# Patient Record
Sex: Female | Born: 1980 | Race: Black or African American | Hispanic: No | Marital: Married | Smoking: Never smoker
Health system: Southern US, Community
[De-identification: ages and names within clinical notes are randomized; demographics above are authoritative.]

## PROBLEM LIST (undated history)

## (undated) ENCOUNTER — Inpatient Hospital Stay (HOSPITAL_COMMUNITY): Payer: Self-pay

## (undated) DIAGNOSIS — I10 Essential (primary) hypertension: Secondary | ICD-10-CM

## (undated) DIAGNOSIS — F410 Panic disorder [episodic paroxysmal anxiety] without agoraphobia: Secondary | ICD-10-CM

## (undated) DIAGNOSIS — O210 Mild hyperemesis gravidarum: Secondary | ICD-10-CM

## (undated) DIAGNOSIS — R000 Tachycardia, unspecified: Secondary | ICD-10-CM

## (undated) HISTORY — PX: BREAST EXCISIONAL BIOPSY: SUR124

## (undated) HISTORY — DX: Panic disorder (episodic paroxysmal anxiety): F41.0

## (undated) HISTORY — DX: Essential (primary) hypertension: I10

## (undated) HISTORY — PX: BREAST FIBROADENOMA SURGERY: SHX580

## (undated) HISTORY — DX: Tachycardia, unspecified: R00.0

## (undated) HISTORY — DX: Mild hyperemesis gravidarum: O21.0

## (undated) HISTORY — PX: OTHER SURGICAL HISTORY: SHX169

---

## 2005-03-08 ENCOUNTER — Ambulatory Visit: Payer: Self-pay | Admitting: Sports Medicine

## 2005-03-18 ENCOUNTER — Ambulatory Visit: Payer: Self-pay | Admitting: Family Medicine

## 2005-08-28 ENCOUNTER — Encounter (INDEPENDENT_AMBULATORY_CARE_PROVIDER_SITE_OTHER): Payer: Self-pay | Admitting: *Deleted

## 2005-08-28 LAB — CONVERTED CEMR LAB

## 2005-08-30 ENCOUNTER — Encounter (INDEPENDENT_AMBULATORY_CARE_PROVIDER_SITE_OTHER): Payer: Self-pay | Admitting: Specialist

## 2005-08-30 ENCOUNTER — Ambulatory Visit: Payer: Self-pay | Admitting: Sports Medicine

## 2005-11-01 ENCOUNTER — Ambulatory Visit: Payer: Self-pay | Admitting: Family Medicine

## 2006-03-01 ENCOUNTER — Ambulatory Visit: Payer: Self-pay | Admitting: Sports Medicine

## 2006-03-27 ENCOUNTER — Ambulatory Visit: Payer: Self-pay | Admitting: Sports Medicine

## 2006-03-27 ENCOUNTER — Encounter: Payer: Self-pay | Admitting: Sports Medicine

## 2006-04-28 ENCOUNTER — Encounter (INDEPENDENT_AMBULATORY_CARE_PROVIDER_SITE_OTHER): Payer: Self-pay | Admitting: *Deleted

## 2006-09-11 ENCOUNTER — Ambulatory Visit: Payer: Self-pay | Admitting: Family Medicine

## 2006-09-11 LAB — CONVERTED CEMR LAB: Beta hcg, urine, semiquantitative: NEGATIVE

## 2007-01-17 ENCOUNTER — Telehealth: Payer: Self-pay | Admitting: *Deleted

## 2007-01-18 ENCOUNTER — Encounter (INDEPENDENT_AMBULATORY_CARE_PROVIDER_SITE_OTHER): Payer: Self-pay | Admitting: *Deleted

## 2007-01-18 ENCOUNTER — Ambulatory Visit: Payer: Self-pay | Admitting: Family Medicine

## 2007-03-21 ENCOUNTER — Ambulatory Visit: Payer: Self-pay | Admitting: Family Medicine

## 2007-04-04 ENCOUNTER — Encounter: Payer: Self-pay | Admitting: Family Medicine

## 2007-04-04 ENCOUNTER — Ambulatory Visit: Payer: Self-pay | Admitting: Family Medicine

## 2007-04-09 ENCOUNTER — Encounter: Payer: Self-pay | Admitting: Family Medicine

## 2007-04-19 ENCOUNTER — Telehealth: Payer: Self-pay | Admitting: *Deleted

## 2007-04-20 ENCOUNTER — Encounter (INDEPENDENT_AMBULATORY_CARE_PROVIDER_SITE_OTHER): Payer: Self-pay | Admitting: Family Medicine

## 2007-04-20 ENCOUNTER — Ambulatory Visit: Payer: Self-pay | Admitting: Family Medicine

## 2007-04-20 LAB — CONVERTED CEMR LAB
Basophils Relative: 0 % (ref 0–1)
Eosinophils Absolute: 0.1 10*3/uL (ref 0.0–0.7)
Hemoglobin: 13.9 g/dL (ref 12.0–15.0)
MCHC: 33.9 g/dL (ref 30.0–36.0)
MCV: 95.3 fL (ref 78.0–100.0)
Monocytes Absolute: 0.7 10*3/uL (ref 0.1–1.0)
Monocytes Relative: 14 % — ABNORMAL HIGH (ref 3–12)
RBC: 4.3 M/uL (ref 3.87–5.11)

## 2007-04-25 ENCOUNTER — Ambulatory Visit: Payer: Self-pay | Admitting: Family Medicine

## 2007-04-25 LAB — CONVERTED CEMR LAB
Protein, U semiquant: NEGATIVE
Urobilinogen, UA: 0.2
WBC Urine, dipstick: NEGATIVE

## 2007-05-02 ENCOUNTER — Telehealth: Payer: Self-pay | Admitting: *Deleted

## 2007-05-03 ENCOUNTER — Encounter (INDEPENDENT_AMBULATORY_CARE_PROVIDER_SITE_OTHER): Payer: Self-pay | Admitting: Family Medicine

## 2007-05-03 ENCOUNTER — Ambulatory Visit: Payer: Self-pay | Admitting: Family Medicine

## 2007-05-03 LAB — CONVERTED CEMR LAB

## 2007-05-07 ENCOUNTER — Encounter (INDEPENDENT_AMBULATORY_CARE_PROVIDER_SITE_OTHER): Payer: Self-pay | Admitting: Family Medicine

## 2007-05-10 ENCOUNTER — Telehealth: Payer: Self-pay | Admitting: *Deleted

## 2007-05-16 ENCOUNTER — Ambulatory Visit: Payer: Self-pay | Admitting: Family Medicine

## 2007-05-16 LAB — CONVERTED CEMR LAB
CO2: 24 meq/L (ref 19–32)
Chloride: 107 meq/L (ref 96–112)
Creatinine, Ser: 0.77 mg/dL (ref 0.40–1.20)
HDL: 51 mg/dL (ref >39)
LDL Cholesterol: 80 mg/dL (ref 0–99)
VLDL: 18 mg/dL (ref 0–40)

## 2007-05-21 ENCOUNTER — Encounter: Payer: Self-pay | Admitting: Family Medicine

## 2007-06-13 ENCOUNTER — Ambulatory Visit: Payer: Self-pay | Admitting: Family Medicine

## 2007-06-13 ENCOUNTER — Ambulatory Visit (HOSPITAL_COMMUNITY): Admission: RE | Admit: 2007-06-13 | Discharge: 2007-06-13 | Payer: Self-pay | Admitting: Family Medicine

## 2007-06-18 ENCOUNTER — Encounter: Payer: Self-pay | Admitting: Family Medicine

## 2008-01-28 ENCOUNTER — Telehealth (INDEPENDENT_AMBULATORY_CARE_PROVIDER_SITE_OTHER): Payer: Self-pay | Admitting: *Deleted

## 2008-01-29 ENCOUNTER — Ambulatory Visit: Payer: Self-pay | Admitting: Family Medicine

## 2008-01-29 LAB — CONVERTED CEMR LAB

## 2008-02-20 ENCOUNTER — Ambulatory Visit: Payer: Self-pay | Admitting: Family Medicine

## 2008-02-20 LAB — CONVERTED CEMR LAB

## 2008-03-18 ENCOUNTER — Telehealth: Payer: Self-pay | Admitting: Family Medicine

## 2008-03-19 ENCOUNTER — Encounter (INDEPENDENT_AMBULATORY_CARE_PROVIDER_SITE_OTHER): Payer: Self-pay | Admitting: Family Medicine

## 2008-03-19 ENCOUNTER — Ambulatory Visit: Payer: Self-pay | Admitting: Family Medicine

## 2008-03-19 ENCOUNTER — Encounter: Payer: Self-pay | Admitting: Family Medicine

## 2008-03-19 LAB — CONVERTED CEMR LAB
Chlamydia, DNA Probe: NEGATIVE
GC Probe Amp, Genital: NEGATIVE

## 2008-03-20 ENCOUNTER — Encounter (INDEPENDENT_AMBULATORY_CARE_PROVIDER_SITE_OTHER): Payer: Self-pay | Admitting: Family Medicine

## 2008-03-24 ENCOUNTER — Ambulatory Visit: Payer: Self-pay | Admitting: Family Medicine

## 2008-03-24 ENCOUNTER — Telehealth: Payer: Self-pay | Admitting: *Deleted

## 2008-03-24 LAB — CONVERTED CEMR LAB: Beta hcg, urine, semiquantitative: POSITIVE

## 2008-03-31 ENCOUNTER — Telehealth (INDEPENDENT_AMBULATORY_CARE_PROVIDER_SITE_OTHER): Payer: Self-pay | Admitting: Family Medicine

## 2008-04-02 ENCOUNTER — Encounter (INDEPENDENT_AMBULATORY_CARE_PROVIDER_SITE_OTHER): Payer: Self-pay | Admitting: Family Medicine

## 2008-04-02 ENCOUNTER — Ambulatory Visit: Payer: Self-pay | Admitting: Family Medicine

## 2008-04-02 ENCOUNTER — Telehealth (INDEPENDENT_AMBULATORY_CARE_PROVIDER_SITE_OTHER): Payer: Self-pay | Admitting: Family Medicine

## 2008-04-02 ENCOUNTER — Encounter: Payer: Self-pay | Admitting: Family Medicine

## 2008-04-02 LAB — CONVERTED CEMR LAB: Urine culture (CF units/mL): NEGATIVE CFunits/mL

## 2008-04-03 ENCOUNTER — Encounter (INDEPENDENT_AMBULATORY_CARE_PROVIDER_SITE_OTHER): Payer: Self-pay | Admitting: Family Medicine

## 2008-04-03 LAB — CONVERTED CEMR LAB
AST: 16 units/L (ref 0–37)
Alkaline Phosphatase: 78 units/L (ref 39–117)
Antibody Screen: NEGATIVE
BUN: 17 mg/dL (ref 6–23)
Creatinine, Ser: 0.89 mg/dL (ref 0.40–1.20)
Glucose, Bld: 89 mg/dL (ref 70–99)
Hepatitis B Surface Ag: NEGATIVE
Lymphocytes Relative: 34 % (ref 12–46)
Lymphs Abs: 2.4 10*3/uL (ref 0.7–4.0)
MCV: 95.6 fL (ref 78.0–100.0)
Monocytes Relative: 12 % (ref 3–12)
Neutro Abs: 3.8 10*3/uL (ref 1.7–7.7)
Neutrophils Relative %: 53 % (ref 43–77)
RBC: 4.81 M/uL (ref 3.87–5.11)
Rh Type: POSITIVE
Rubella: 80.1 intl units/mL — ABNORMAL HIGH
WBC: 7.1 10*3/uL (ref 4.0–10.5)

## 2008-04-11 ENCOUNTER — Telehealth (INDEPENDENT_AMBULATORY_CARE_PROVIDER_SITE_OTHER): Payer: Self-pay | Admitting: *Deleted

## 2008-04-15 ENCOUNTER — Telehealth: Payer: Self-pay | Admitting: *Deleted

## 2008-04-16 ENCOUNTER — Encounter (INDEPENDENT_AMBULATORY_CARE_PROVIDER_SITE_OTHER): Payer: Self-pay | Admitting: Family Medicine

## 2008-04-16 ENCOUNTER — Ambulatory Visit: Payer: Self-pay | Admitting: Family Medicine

## 2008-04-16 ENCOUNTER — Encounter: Payer: Self-pay | Admitting: Family Medicine

## 2008-04-16 ENCOUNTER — Other Ambulatory Visit: Admission: RE | Admit: 2008-04-16 | Discharge: 2008-04-16 | Payer: Self-pay | Admitting: Family Medicine

## 2008-04-16 LAB — CONVERTED CEMR LAB: Whiff Test: NEGATIVE

## 2008-04-21 ENCOUNTER — Encounter: Payer: Self-pay | Admitting: Family Medicine

## 2008-04-21 ENCOUNTER — Encounter: Payer: Self-pay | Admitting: *Deleted

## 2008-04-28 ENCOUNTER — Telehealth: Payer: Self-pay | Admitting: Family Medicine

## 2008-04-28 ENCOUNTER — Ambulatory Visit: Payer: Self-pay | Admitting: Family Medicine

## 2008-04-30 ENCOUNTER — Ambulatory Visit: Payer: Self-pay | Admitting: Family Medicine

## 2008-05-01 ENCOUNTER — Ambulatory Visit: Payer: Self-pay | Admitting: Family Medicine

## 2008-05-05 ENCOUNTER — Ambulatory Visit: Payer: Self-pay | Admitting: Obstetrics & Gynecology

## 2008-05-05 LAB — CONVERTED CEMR LAB
ALT: 17 units/L (ref 0–35)
BUN: 6 mg/dL (ref 6–23)
CO2: 21 meq/L (ref 19–32)
Calcium: 8.7 mg/dL (ref 8.4–10.5)
Chloride: 105 meq/L (ref 96–112)
Collection Interval-CRCL: 24 hr
Creatinine 24 HR UR: 1704 mg/24hr (ref 700–1800)
Creatinine Clearance: 223 mL/min — ABNORMAL HIGH (ref 75–115)
Creatinine, Ser: 0.53 mg/dL (ref 0.40–1.20)
Creatinine, Urine: 213 mg/dL
HCT: 36.1 % (ref 36.0–46.0)
Hemoglobin: 12.3 g/dL (ref 12.0–15.0)
MCV: 92.3 fL (ref 78.0–100.0)
Platelets: 262 10*3/uL (ref 150–400)
RDW: 12.7 % (ref 11.5–15.5)
Total Bilirubin: 0.2 mg/dL — ABNORMAL LOW (ref 0.3–1.2)
Uric Acid, Serum: 3.3 mg/dL (ref 2.4–7.0)
WBC: 5 10*3/uL (ref 4.0–10.5)

## 2008-05-08 ENCOUNTER — Telehealth: Payer: Self-pay | Admitting: *Deleted

## 2008-05-20 ENCOUNTER — Ambulatory Visit: Payer: Self-pay | Admitting: Family Medicine

## 2008-05-22 ENCOUNTER — Ambulatory Visit: Payer: Self-pay | Admitting: Obstetrics & Gynecology

## 2008-06-05 ENCOUNTER — Ambulatory Visit: Payer: Self-pay | Admitting: Family Medicine

## 2008-06-09 ENCOUNTER — Telehealth: Payer: Self-pay | Admitting: Family Medicine

## 2008-06-25 ENCOUNTER — Ambulatory Visit: Payer: Self-pay | Admitting: Family Medicine

## 2008-06-27 ENCOUNTER — Ambulatory Visit (HOSPITAL_COMMUNITY): Admission: RE | Admit: 2008-06-27 | Discharge: 2008-06-27 | Payer: Self-pay | Admitting: Obstetrics & Gynecology

## 2008-07-03 ENCOUNTER — Ambulatory Visit: Payer: Self-pay | Admitting: Family Medicine

## 2008-07-04 ENCOUNTER — Encounter: Payer: Self-pay | Admitting: Family Medicine

## 2008-07-04 LAB — CONVERTED CEMR LAB: Trich, Wet Prep: NONE SEEN

## 2008-07-24 ENCOUNTER — Telehealth: Payer: Self-pay | Admitting: Family Medicine

## 2008-07-24 ENCOUNTER — Ambulatory Visit: Payer: Self-pay | Admitting: Family Medicine

## 2008-08-04 ENCOUNTER — Telehealth: Payer: Self-pay | Admitting: Family Medicine

## 2008-08-14 ENCOUNTER — Ambulatory Visit: Payer: Self-pay | Admitting: Obstetrics & Gynecology

## 2008-09-08 ENCOUNTER — Ambulatory Visit: Payer: Self-pay | Admitting: Obstetrics & Gynecology

## 2008-09-08 LAB — CONVERTED CEMR LAB
HCT: 37.4 % (ref 36.0–46.0)
Hemoglobin: 12.8 g/dL (ref 12.0–15.0)
MCHC: 34.2 g/dL (ref 30.0–36.0)
MCV: 97.9 fL (ref 78.0–100.0)
RBC: 3.82 M/uL — ABNORMAL LOW (ref 3.87–5.11)
WBC: 8 10*3/uL (ref 4.0–10.5)

## 2008-09-12 ENCOUNTER — Ambulatory Visit (HOSPITAL_COMMUNITY): Admission: RE | Admit: 2008-09-12 | Discharge: 2008-09-12 | Payer: Self-pay | Admitting: Family Medicine

## 2008-09-22 ENCOUNTER — Ambulatory Visit: Payer: Self-pay | Admitting: Obstetrics & Gynecology

## 2008-10-06 ENCOUNTER — Ambulatory Visit: Payer: Self-pay | Admitting: Obstetrics & Gynecology

## 2008-10-16 ENCOUNTER — Ambulatory Visit: Payer: Self-pay | Admitting: Family Medicine

## 2008-10-16 ENCOUNTER — Telehealth: Payer: Self-pay | Admitting: *Deleted

## 2008-10-22 ENCOUNTER — Ambulatory Visit (HOSPITAL_COMMUNITY): Admission: RE | Admit: 2008-10-22 | Discharge: 2008-10-22 | Payer: Self-pay | Admitting: Obstetrics and Gynecology

## 2008-10-23 ENCOUNTER — Ambulatory Visit: Payer: Self-pay | Admitting: Obstetrics & Gynecology

## 2008-10-30 ENCOUNTER — Ambulatory Visit: Payer: Self-pay | Admitting: Family Medicine

## 2008-10-31 ENCOUNTER — Encounter: Payer: Self-pay | Admitting: Family Medicine

## 2008-10-31 LAB — CONVERTED CEMR LAB: GC Probe Amp, Genital: NEGATIVE

## 2008-11-06 ENCOUNTER — Ambulatory Visit: Payer: Self-pay | Admitting: Family Medicine

## 2008-11-13 ENCOUNTER — Ambulatory Visit: Payer: Self-pay | Admitting: Obstetrics & Gynecology

## 2008-11-17 ENCOUNTER — Ambulatory Visit (HOSPITAL_COMMUNITY): Admission: RE | Admit: 2008-11-17 | Discharge: 2008-11-17 | Payer: Self-pay | Admitting: Family Medicine

## 2008-11-17 ENCOUNTER — Ambulatory Visit: Payer: Self-pay | Admitting: Obstetrics & Gynecology

## 2008-11-20 ENCOUNTER — Inpatient Hospital Stay (HOSPITAL_COMMUNITY): Admission: AD | Admit: 2008-11-20 | Discharge: 2008-11-23 | Payer: Self-pay | Admitting: Obstetrics & Gynecology

## 2008-11-20 ENCOUNTER — Ambulatory Visit: Payer: Self-pay | Admitting: Obstetrics & Gynecology

## 2008-11-25 ENCOUNTER — Encounter: Payer: Self-pay | Admitting: Family Medicine

## 2008-11-26 ENCOUNTER — Ambulatory Visit: Payer: Self-pay | Admitting: Family Medicine

## 2008-11-27 ENCOUNTER — Telehealth: Payer: Self-pay | Admitting: Family Medicine

## 2009-01-02 ENCOUNTER — Ambulatory Visit: Payer: Self-pay | Admitting: Family Medicine

## 2009-02-10 ENCOUNTER — Telehealth: Payer: Self-pay | Admitting: *Deleted

## 2009-06-01 ENCOUNTER — Ambulatory Visit: Payer: Self-pay | Admitting: Family Medicine

## 2009-06-01 ENCOUNTER — Encounter: Payer: Self-pay | Admitting: Family Medicine

## 2009-06-01 ENCOUNTER — Other Ambulatory Visit: Admission: RE | Admit: 2009-06-01 | Discharge: 2009-06-01 | Payer: Self-pay | Admitting: Family Medicine

## 2009-06-01 DIAGNOSIS — J3089 Other allergic rhinitis: Secondary | ICD-10-CM | POA: Insufficient documentation

## 2009-06-01 DIAGNOSIS — R5381 Other malaise: Secondary | ICD-10-CM | POA: Insufficient documentation

## 2009-06-01 DIAGNOSIS — J309 Allergic rhinitis, unspecified: Secondary | ICD-10-CM

## 2009-06-01 DIAGNOSIS — R5383 Other fatigue: Secondary | ICD-10-CM

## 2009-06-01 HISTORY — DX: Other allergic rhinitis: J30.89

## 2009-06-01 LAB — CONVERTED CEMR LAB
HCT: 40.9 % (ref 36.0–46.0)
Hemoglobin: 13.5 g/dL (ref 12.0–15.0)
MCV: 96.9 fL (ref 78.0–100.0)
Platelets: 281 10*3/uL (ref 150–400)
RBC: 4.22 M/uL (ref 3.87–5.11)
WBC: 5 10*3/uL (ref 4.0–10.5)

## 2009-06-05 ENCOUNTER — Encounter: Payer: Self-pay | Admitting: Family Medicine

## 2009-06-05 LAB — CONVERTED CEMR LAB: Pap Smear: NORMAL

## 2009-12-03 ENCOUNTER — Ambulatory Visit: Payer: Self-pay | Admitting: Family Medicine

## 2010-02-04 ENCOUNTER — Ambulatory Visit: Payer: Self-pay | Admitting: Family Medicine

## 2010-02-05 ENCOUNTER — Encounter: Payer: Self-pay | Admitting: Family Medicine

## 2010-02-05 DIAGNOSIS — R05 Cough: Secondary | ICD-10-CM | POA: Insufficient documentation

## 2010-02-05 DIAGNOSIS — R059 Cough, unspecified: Secondary | ICD-10-CM | POA: Insufficient documentation

## 2010-02-05 LAB — CONVERTED CEMR LAB
GC Probe Amp, Genital: NEGATIVE
Whiff Test: NEGATIVE

## 2010-02-09 ENCOUNTER — Telehealth: Payer: Self-pay | Admitting: Family Medicine

## 2010-03-21 ENCOUNTER — Encounter: Payer: Self-pay | Admitting: Family Medicine

## 2010-03-30 NOTE — Assessment & Plan Note (Signed)
Summary: CHECK FOR STD/[facility]   Vital Signs:  Patient profile:   30 year old female Height:      65.25 inches Weight:      140 pounds BMI:     23.20 Pulse rate:   69 / minute BP sitting:   128 / 72  (right arm)  Vitals Entered By: Rochele Pages RN (February 05, 2010 2:05 PM) CC: STD screen, chest congestion Pain Assessment Patient in pain? no        Primary Care Provider:  Bobby Rumpf  MD  CC:  STD screen and chest congestion.  History of Present Illness: 1. STD Screen: Pt is a 30 yo F who requests a STD screen. She currently has a new sexual partner and does not know their STD status. She uses condoms every time. Denies any symptoms.   2. Cough: Started having a dry cough 5 days ago. Says there there has been something going around her office. Denies fevers/chills, rhinorrhea, sputum production. Complains of a sore throat. Tried Robitussin but says it hasn't helped. Has never had this before.   See prior meds for med rec  Habits & Providers  Alcohol-Tobacco-Diet     Tobacco Status: never  Medications Prior to Update: 1)  Fluticasone Propionate 50 Mcg/act Susp (Fluticasone Propionate) .... 2 Sprays Each Nostril Daily For Allergies  Allergies (verified): 1)  ! * Paraphenylenediamine  Physical Exam  General:  Well-developed,well-nourished,in no acute distress; alert,appropriate and cooperative throughout examination Ears:  External ear exam shows no significant lesions or deformities.  Otoscopic examination reveals clear canals, tympanic membranes are intact bilaterally without bulging, retraction, inflammation or discharge. Hearing is grossly normal bilaterally. Mouth:  Oral mucosa and oropharynx without lesions or exudates.  Teeth in good repair. Some minor postnasal drip  Neck:  No deformities, masses, or tenderness noted. Lungs:  Normal respiratory effort, chest expands symmetrically. Lungs are clear to auscultation, no crackles or wheezes. Heart:  Normal rate and  regular rhythm. S1 and S2 normal without gallop, murmur, click, rub or other extra sounds. Genitalia:  Normal introitus for age, no external lesions, +ve thick white vaginal discharge, mucosa pink and moist, no vaginal or cervical lesions, no vaginal atrophy, no friaility or hemorrhage, normal uterus size and position, no adnexal masses or tenderness   Impression & Recommendations:  Problem # 1:  SCREENING EXAMINATION FOR VENEREAL DISEASE (ICD-V74.5) Assessment Comment Only Testing as below. Follow up as needed. Will notify of results.   Orders: GC/Chlamydia-[facility] ([id number]/87491) Wet Prep- [facility] [phone]) [facility]- Est Level  3 (99213)Future Orders: Hep Bs Ag-[facility] ([id number]) ... 02/03/2011 RPR-[facility] [phone]) ... 02/16/2011 HIV-[facility] ([id number]) ... 02/10/2011  Problem # 2:  COUGH (ICD-786.2) Assessment: New  Likely viral URI given symptom presentation. Will give Tessalon Perles for relief. Advised regarding red flags that would prompt return to care.   Orders: [facility]- Est Level  3 (96295)  Complete Medication List: 1)  Fluticasone Propionate 50 Mcg/act Susp (Fluticasone propionate) .... 2 sprays each nostril daily for allergies 2)  Tessalon Perles 100 Mg Caps (Benzonatate) .... One tab by mouth three times a day as needed for cough  Patient Instructions: 1)  It was great to see you today!  2)  Take Tessalon Perles as needed for cough.  3)  Come back in if symptoms are getting worse.  Prescriptions: TESSALON PERLES 100 MG CAPS (BENZONATATE) one tab by mouth three times a day as needed for cough  #30 x 0   Entered and Authorized by:  Bobby Rumpf  MD   Signed by:   Bobby Rumpf  MD on 02/05/2010   Method used:   [facility]        [facility]  [address] [id number]       [address]       [place], [place]  [address]       Ph: [phone] or [phone]       Fax: [phone]   RxID:   [id number]    Orders Added: 1)  GC/Chlamydia-[facility] [87591/87491] 2)  Mellody Drown Prep-  [facility] [87210] 3)  Hep Bs Ag-[facility] [[id number]] 4)  RPR-[facility] [[id number]] 5)  HIV-[facility] [[id number]] 6)  [facility]- Est Level  3 [16606]    Laboratory Results  Date/Time Received: February 05, 2010 2:55 PM  Date/Time Reported: February 05, 2010 3:33 PM   [employer] Source: vag WBC/hpf: 10-20 Bacteria/hpf: 3+  Rods Clue cells/hpf: none  Negative whiff Yeast/hpf: none Trichomonas/hpf: none Comments: ...............test performed by......Marland KitchenBonnie A. [place], MLS (ASCP)cm

## 2010-03-30 NOTE — Assessment & Plan Note (Signed)
Summary: cpe,df   Vital Signs:  Patient profile:   30 year old female Height:      65.25 inches Weight:      136 pounds BMI:     22.54 BSA:     1.69 Temp:     99.4 degrees F Pulse rate:   109 / minute BP sitting:   144 / 92  Vitals Entered By: Jone Baseman CMA (June 01, 2009 4:19 PM)  Serial Vital Signs/Assessments:  Time      Position  BP       Pulse  Resp  Temp     By                     132/80                         Ancil Boozer  MD  CC: CPP Is Patient Diabetic? No Pain Assessment Patient in pain? no        Primary Care Provider:  Ancil Boozer  MD  CC:  CPP.  History of Present Illness: concerns today: cold vs allergy symptoms: has been going on for about 1 month. initially with minor non-productive cough that responded to OTC antitussives.  no with also rhinorrhea - initially clear, now more yellow.  trying otc allergy med without much relief.  no fevers noted.  no GI symptoms. no one else around her sick.  fatigue: has 6 mo old at home - only giving her 4 hr stretch of sleep at a time.  denies feeling depressed or down.  is in a new stressful position at work though. denies heavy periods.  denies cold or heat intolerance.    htn: did check bp for a while post partum - numbers at that time were typically 129/82ish with occasional higher ones.  not feeling bad but hasn't been watching numbers recently.    preventative care - uses condoms for contraception. due for pap today.   Habits & Providers  Alcohol-Tobacco-Diet     Tobacco Status: never  Current Medications (verified): 1)  Fluticasone Propionate 50 Mcg/act Susp (Fluticasone Propionate) .... 2 Sprays Each Nostril Daily For Allergies  Allergies (verified): 1)  ! * Paraphenylenediamine  Past History:  Past medical, surgical, family and social histories (including risk factors) reviewed, and no changes noted (except as noted below).  Past Medical History: Reviewed history from 01/02/2009 and no  changes required. ? Panic attacks--remote,  abn paps (ascus)--colpo nl allergic reaction resulting in lymphadenitis to  hair dye 04/2007 G1P1001 HTN  Past Surgical History: Reviewed history from 04/16/2008 and no changes required. fibroadenoma removal r. breast - 08/29/2003,  gardisil x 3 - 08/30/2005    Family History: Reviewed history from 04/16/2008 and no changes required. cad--mom late 30`s  HTN in sisters, mom no hx of lupus in family.    Social History: Reviewed history from 04/16/2008 and no changes required. Lives with child.  Parent/sister in town.  Denies drugs or alcohol, or smoking.  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    Review of Systems       per HPI.  No vision changes, no hearing changes, no , shortness of breath, chest pain, abdominal pain, change in bowel habits, diarrhea, constipation, melena, BRBPR, dysuria, urinary frequency, joint aches or pains, rash.   Physical Exam  General:  pleasant, well hydrated, well nourished female   Head:  NCAT Eyes:  allergic conjunctivitis  Ears:  R TM occluded by cerumen.  L TM WNL Nose:  clear rhiorrhea with boggy turbinates R>L Mouth:  moist mucus membranes. mild erythema posterior pharynx Neck:  supple.  no LAD Lungs:  Normal respiratory effort, chest expands symmetrically. Lungs are clear to auscultation, no crackles or wheezes. Heart:  Normal rate and regular rhythm. S1 and S2 normal without gallop, murmur, click, rub or other extra sounds. Abdomen:  Bowel sounds positive,abdomen soft and non-tender without masses, organomegaly or hernias noted. Genitalia:  Normal introitus for age, no external lesions, no vaginal discharge, mucosa pink and moist, no vaginal or cervical lesions, no vaginal atrophy, no friaility or hemorrhage, normal uterus size and position, no adnexal masses or tenderness Extremities:  no edema.  2+ pulses Neurologic:  alert & oriented X3, cranial nerves II-XII  intact, and gait normal.   Skin:  Intact without suspicious lesions or rashes Psych:  Cognition and judgment appear intact. Alert and cooperative with normal attention span and concentration. No apparent delusions, illusions, hallucinations   Impression & Recommendations:  Problem # 1:  ROUTINE GYNECOLOGICAL EXAMINATION (ICD-V72.31) Assessment Unchanged  doing well.  pap performed.  condoms for contraception  Orders: [facility] - Est  18-39 yrs [phone])  Problem # 2:  ALLERGIC RHINITIS (ICD-477.9) Assessment: New  try otc allergy med + flonase.    Her updated medication list for this problem includes:    Fluticasone Propionate 50 Mcg/act Susp (Fluticasone propionate) .Marland Kitchen... 2 sprays each nostril daily for allergies  Orders: [facility]- Est Level  3 (60454)  Problem # 3:  HYPERTENSION (ICD-401.1) Assessment: Unchanged  monitor closely to see if need to restart med  Orders: [facility]- Est Level  3 (09811)  Problem # 4:  FATIGUE (ICD-780.79) Assessment: New check basic labs but likely is 2/2 not enough sleep with new baby.  monitor  Orders: CBC-[facility] ([id number]) TSH-[facility] ([id number]) [facility]- Est Level  3 (57846)  Complete Medication List: 1)  Fluticasone Propionate 50 Mcg/act Susp (Fluticasone propionate) .... 2 sprays each nostril daily for allergies  Other Orders: Pap Smear-[facility] ([id number])  Patient Instructions: 1)  If you start getting fevers or were getting better and then started getting signfiicantly worse - call for an appointment.  you may need an antibiotic at that point. 2)  For allergies use the nose spray and try an over the counter allergy med - such as generic claritin, generic zyrtec or generic allegra. 3)  Keep an eye on your blood pressures.  If they stay consistently elevated above 140/90 then we may need to get you back on your blood pressure medicine. 4)  I suspect your fatigue is primarily from being a new mom but if anything unusual comes from the labs I will let you know.   5)  I'll send you a letter with your pap test results.  Prescriptions: FLUTICASONE PROPIONATE 50 MCG/ACT SUSP (FLUTICASONE PROPIONATE) 2 sprays each nostril daily for allergies  #1 x 6   Entered and Authorized by:   Ancil Boozer  MD   Signed by:   Ancil Boozer  MD on 06/01/2009   Method used:   [facility]        [facility]  [address]       [address]       [place], [place]  [address]       Ph: [phone] or [phone]       Fax: [phone]   RxID:   [id number]   Prevention & Chronic Care Immunizations  Influenza vaccine: Fluvax Non-MCR  (01/02/2009)    Tetanus booster: Not documented    Pneumococcal vaccine: Not documented  Other Screening   Pap smear: NEGATIVE FOR INTRAEPITHELIAL LESIONS OR MALIGNANCY.  (04/16/2008)   Pap smear due: 08/29/2006   Smoking status: never  (06/01/2009)  Hypertension   Last Blood Pressure: 144 / 92  (06/01/2009)   Serum creatinine: 0.53  (05/05/2008)   Serum potassium 4.2  (05/05/2008)    Hypertension flowsheet reviewed?: Yes   Progress toward BP goal: At goal  Self-Management Support :   Personal Goals (by the next clinic visit) :      Personal blood pressure goal: 140/90  (06/01/2009)   Hypertension self-management support: Not documented    Hypertension self-management support not done because: Good outcomes  (06/01/2009)

## 2010-03-30 NOTE — Assessment & Plan Note (Signed)
Summary: FLU SHOT/KH  Nurse Visit   Allergies: 1)  ! * Paraphenylenediamine  Immunizations Administered:  Influenza Vaccine # 1:    Vaccine Type: Fluvax 3+    Site: right deltoid    Mfr: GlaxoSmithKline    Dose: 0.5 ml    Route: IM    Given by: Jone Baseman CMA    Exp. Date: 08/25/2010    Lot #: [id number]    VIS given: 09/22/09 version given December 03, 2009.  Flu Vaccine Consent Questions:    Do you have a history of severe allergic reactions to this vaccine? no    Any prior history of allergic reactions to egg and/or gelatin? no    Do you have a sensitivity to the preservative Thimersol? no    Do you have a past history of Guillan-Barre Syndrome? no    Do you currently have an acute febrile illness? no    Have you ever had a severe reaction to latex? no    Vaccine information given and explained to patient? yes    Are you currently pregnant? no  Orders Added: 1)  Flu Vaccine 62yrs + [90658] 2)  Admin 1st Vaccine [56213]

## 2010-03-30 NOTE — Letter (Signed)
Summary: Generic Letter  [facility]  [address]   [place], [place] [address]   Phone: [phone]  Fax: [phone]    06/05/2009  Paige Neal [address] [place], [place]  [address]  Dear Ms. [name],  Your recent pap smear was normal - your next one is due in 1 year.  Also your blood work for your fatigue was all normal at this time.  If you have questions please feel free to call the [facility].      Sincerely,   Ancil Boozer  MD  Appended Document: Generic Letter mailed.

## 2010-04-01 NOTE — Progress Notes (Signed)
Summary: phn msg  Phone Note Call from Patient Call back at [phone]   Caller: Patient Summary of Call: pt is asking for results of test from last week Initial call taken by: De Nurse,  February 09, 2010 2:15 PM  Follow-up for Phone Call        Discussed results by phone.  Follow-up by: Bobby Rumpf  MD,  February 09, 2010 2:25 PM

## 2010-04-30 ENCOUNTER — Encounter: Payer: Self-pay | Admitting: Family Medicine

## 2010-04-30 ENCOUNTER — Ambulatory Visit (INDEPENDENT_AMBULATORY_CARE_PROVIDER_SITE_OTHER): Payer: BC Managed Care – PPO | Admitting: Family Medicine

## 2010-04-30 VITALS — BP 132/94 | HR 74 | Temp 99.1°F | Ht 66.0 in | Wt 132.0 lb

## 2010-04-30 DIAGNOSIS — H60399 Other infective otitis externa, unspecified ear: Secondary | ICD-10-CM

## 2010-04-30 DIAGNOSIS — H6090 Unspecified otitis externa, unspecified ear: Secondary | ICD-10-CM

## 2010-04-30 MED ORDER — HYDROCORTISONE-ACETIC ACID 1-2 % OT SOLN: 4.0000 [drp] | Freq: Four times a day (QID) | OTIC | Status: AC

## 2010-04-30 NOTE — Patient Instructions (Signed)
Use ear drops for 5-7 days or  Until improved.  May use hydrogen perozide or debrox drops to help with earwax- do not put  qtips in ear. Follow-up as needed

## 2010-04-30 NOTE — Progress Notes (Signed)
  Subjective:    Patient ID: Paige Neal, female    DOB: 1980/05/06, 30 y.o.   MRN: [id number]  HPI1-2 weeks of bilateral ear pain.  Cleaning ears with qtips.  No ear drainage.  Some green nasal discharge.     Review of SystemsGeneral:  Negative for fever, chills, malaise, myalgias HEENT: Negative for conjunctivitis, , sore throat Respiratory:  Negative for cough, sputum, dyspnea Abdomen: Negative for abdominal pain, emesis, diarrhea Skin:  Negative for rash        Objective:   Physical Exam  Constitutional: She appears well-developed and well-nourished.  HENT:  Head: Normocephalic.       Right ear canal extremely tender. Large cerumen ball dislodged with water irrigation withs ome surrounding canal irritation.  Bilateral TM's wnl.    Eyes: Conjunctivae are normal. Pupils are equal, round, and reactive to light. Left eye exhibits no discharge.  Neck: Neck supple. No thyromegaly present.  Cardiovascular: Normal rate and regular rhythm.   No murmur heard. Pulmonary/Chest: Effort normal and breath sounds normal. She has no wheezes.  Lymphadenopathy:    She has no cervical adenopathy.          Assessment & Plan:

## 2010-04-30 NOTE — Assessment & Plan Note (Signed)
Water irrigation performed.  Right Otitis media.  Vosol-HC drops.  Advised on ear hygiene, avoiding qtips.  Follow-up prn.

## 2010-05-20 ENCOUNTER — Ambulatory Visit (INDEPENDENT_AMBULATORY_CARE_PROVIDER_SITE_OTHER): Payer: BC Managed Care – PPO | Admitting: Family Medicine

## 2010-05-20 ENCOUNTER — Encounter: Payer: Self-pay | Admitting: Family Medicine

## 2010-05-20 VITALS — BP 132/94 | HR 75 | Temp 98.2°F | Ht 65.0 in | Wt 132.6 lb

## 2010-05-20 DIAGNOSIS — I10 Essential (primary) hypertension: Secondary | ICD-10-CM

## 2010-05-20 DIAGNOSIS — Z113 Encounter for screening for infections with a predominantly sexual mode of transmission: Secondary | ICD-10-CM

## 2010-05-20 LAB — RPR

## 2010-05-20 LAB — BASIC METABOLIC PANEL
CO2: 27 mEq/L (ref 19–32)
Calcium: 9.7 mg/dL (ref 8.4–10.5)
Creat: 0.88 mg/dL (ref 0.40–1.20)
Sodium: 138 mEq/L (ref 135–145)

## 2010-05-20 NOTE — Patient Instructions (Signed)
It was great to see you today! You should think about if you are putting too much on yourself. I want you to come back to see me in three months (in June 2012) Try to get exercise as we discussed - 30-45 minutes.  I will let you know about your tests by phone or letter    2 Gram Low Sodium Diet A 2 gram sodium diet restricts the amount of sodium in the diet to no more than 2 grams (g) or 2000 milligrams (mg) daily. Limiting the amount of sodium is often used to help lower blood pressure. It is important if you have heart, liver, or kidney problems. Many foods contain sodium for flavor and sometimes as a preservative. When the amount of sodium in a diet needs to be low, it is important to know what to look for when choosing foods and drinks. The following includes some information and guidelines to help make it easier for you to adapt to a low sodium diet. QUICK TIPS  Do not add salt to food.   Avoid convenience items and fast food.   Choose unsalted snack foods.   Buy lower sodium products, often labeled as "lower sodium" or "no salt added."   Check food labels to learn how much sodium is in 1 serving.   When eating at a restaurant, ask that your food be prepared with less salt or none, if possible.  READING FOOD LABELS FOR SODIUM INFORMATION The nutrition facts label is a good place to find how much sodium is in foods. Look for products with no more than 500 to 600 mg of sodium per meal and no more than 150 mg per serving. Remember that 2 g = 2000 mg. The food label may also list foods as:  Sodium-free: Less than 5 mg in a serving.   Very low sodium: 35 mg or less in a serving.   Low-sodium: 140 mg or less in a serving.   Light in sodium: 50% less sodium in a serving. For example, if a food that usually has 300 mg of sodium is changed to become light in sodium, it will have 150 mg of sodium.   Reduced sodium: 25% less sodium in a serving. For example, if a food that usually has  400 mg of sodium is changed to reduced sodium, it will have 300 mg of sodium.  CHOOSING FOODS AVOID CHOOSE  Grains: Salted crackers and snack items. Some cereals, including instant hot cereals. Bread stuffing and biscuit mixes. Seasoned rice or pasta mixes. Grains: Unsalted snack items. Low-sodium cereals, oats, puffed wheat and rice, shredded wheat. English muffins and bread. Pasta.  Meats: Salted, canned, smoked, spiced, or pickled meats, including fish and poultry. Bacon, ham, sausage, cold cuts, hot dogs, anchovies. Meats: Low-sodium canned tuna and salmon. Fresh or frozen meat, poultry, and fish.  Dairy: Processed cheese and spreads. Cottage cheese. Buttermilk and condensed milk. Regular cheese. Dairy: Milk. Low-sodium cottage cheese. Yogurt. Sour cream. Low-sodium cheese.  Fruits and Vegetables: Regular canned vegetables. Regular canned tomato sauce and paste. Frozen vegetables in sauces. Olives. Rosita Fire. Relishes. Sauerkraut. Fruits and Vegetables: Low-sodium canned vegetables. Low-sodium tomato sauce and paste. Fresh and frozen vegetables. Fresh and frozen fruit.  Condiments: Canned and packaged gravies. Worcestershire sauce. Tartar sauce. Barbecue sauce. Soy sauce. Steak sauce. Ketchup. Onion, garlic, and table salt. Meat flavorings and tenderizers. Condiments: Fresh and dried herbs and spices. Low-sodium varieties of mustard and ketchup. Lemon juice. Tabasco sauce. Horseradish.  SAMPLE 2 GRAM  SODIUM MEAL PLAN Meal Foods Sodium (mg)  Breakfast 1 cup low-fat milk 2 slices whole-wheat toast 1 tablespoon heart-healthy margarine 1 hard-boiled egg 1 small orange [phone]  Lunch 1 cup raw carrots  cup hummus 1 cup low-fat milk  cup red grapes 1 whole-wheat pita bread 76 [phone]  Dinner 1 cup whole-wheat pasta 1 cup low-sodium tomato sauce 3 ounces lean ground beef 1 small side salad (1 cup raw spinach leaves,  cup  cucumber,  cup yellow bell pepper) with 1 teaspoon olive oil and 1 teaspoon red wine vinegar 2 73 57 25  Snack 1 container low-fat vanilla yogurt 3 graham cracker squares 107 127  Nutrient Analysis Calories: 2033 Protein: 77 g Carbohydrate: 282 g Fat: 72 g Sodium: 1971 mg Document Released: 02/14/2005 Document Re-Released: 08/04/2009 [facility] Patient Information 2011 [place], [place].

## 2010-05-21 ENCOUNTER — Encounter: Payer: Self-pay | Admitting: Family Medicine

## 2010-05-21 LAB — HIV ANTIBODY (ROUTINE TESTING W REFLEX): HIV: NONREACTIVE

## 2010-05-21 NOTE — Progress Notes (Signed)
  Subjective:    Patient ID: Paige Neal, female    DOB: Jul 01, 1980, 30 y.o.   MRN: [id number]  HPI 1) Hypertension: History of hypertension, not on medications currently (has been on HCTZ in the past, however this was discontinued during her last pregnancy). Checks BP at home with wrist cuff - 120s-150s / 70s-90s. Reports increased stress with trying to balance full-time job, part-time job, school, and child. Feels like she is putting too much on herself - feels highly stressed at times. Does not exercise ("don't have time), does not monitor diet. Strong family history of hypertension.   Does not smoke or use illicit substances.    Review of Systems Denies chest pain, dyspnea, LE edema, headache, vision change, urinary changes    Objective:   Physical Exam  Constitutional: She appears well-developed and well-nourished. No distress.  Eyes:  Fundoscopic exam:      The right eye shows no arteriolar narrowing and no AV nicking.       The left eye shows no arteriolar narrowing and no AV nicking.  Neck: No JVD present.  Cardiovascular: Normal rate, regular rhythm, normal heart sounds and intact distal pulses.  Exam reveals no gallop.   No murmur heard.      No LE edema  Pulmonary/Chest: Effort normal and breath sounds normal. No respiratory distress.  Abdominal: Soft. Normal appearance, normal aorta and bowel sounds are normal. She exhibits no abdominal bruit, no pulsatile midline mass and no mass. There is no tenderness.  Neurological: She is alert.          Assessment & Plan:

## 2010-05-21 NOTE — Assessment & Plan Note (Signed)
Recheck BP 132 / 94 (initially 157/98). Advised to bring in wrist cuff to calibrate. Advised regarding low sodium diet (handout given.) Counseled on diet and exercise for 15 minutes. Advised regarding stress reduction - suspect that stress is contributing greatly. Will check BMET today. Consider echocardiogram / renal artery ultrasound in near future to work up. TSH has been normal less than one year ago - will not recheck today.

## 2010-06-04 LAB — POCT URINALYSIS DIP (DEVICE)
Glucose, UA: NEGATIVE mg/dL
Hgb urine dipstick: NEGATIVE
Hgb urine dipstick: NEGATIVE
Nitrite: NEGATIVE
Nitrite: NEGATIVE
Nitrite: NEGATIVE
Protein, ur: NEGATIVE mg/dL
Protein, ur: NEGATIVE mg/dL
Specific Gravity, Urine: 1.01 (ref 1.005–1.030)
Urobilinogen, UA: 0.2 mg/dL (ref 0.0–1.0)
Urobilinogen, UA: 0.2 mg/dL (ref 0.0–1.0)
Urobilinogen, UA: 0.2 mg/dL (ref 0.0–1.0)

## 2010-06-04 LAB — CBC
MCHC: 33.9 g/dL (ref 30.0–36.0)
MCV: 101.4 fL — ABNORMAL HIGH (ref 78.0–100.0)
Platelets: 205 10*3/uL (ref 150–400)
Platelets: 229 10*3/uL (ref 150–400)
RDW: 13.7 % (ref 11.5–15.5)
WBC: 10.6 10*3/uL — ABNORMAL HIGH (ref 4.0–10.5)
WBC: 9.3 10*3/uL (ref 4.0–10.5)

## 2010-06-04 LAB — CREATININE, SERUM
Creatinine, Ser: 0.87 mg/dL (ref 0.4–1.2)
GFR calc Af Amer: >60 mL/min (ref >60)
GFR calc non Af Amer: >60 mL/min (ref >60)

## 2010-06-04 LAB — RPR: RPR Ser Ql: NONREACTIVE

## 2010-06-05 LAB — POCT URINALYSIS DIP (DEVICE)
Bilirubin Urine: NEGATIVE
Bilirubin Urine: NEGATIVE
Bilirubin Urine: NEGATIVE
Glucose, UA: NEGATIVE mg/dL
Glucose, UA: NEGATIVE mg/dL
Glucose, UA: NEGATIVE mg/dL
Hgb urine dipstick: NEGATIVE
Ketones, ur: NEGATIVE mg/dL
Nitrite: NEGATIVE
Nitrite: NEGATIVE
Specific Gravity, Urine: 1.015 (ref 1.005–1.030)
pH: 7 (ref 5.0–8.0)
pH: 6.5 (ref 5.0–8.0)

## 2010-06-06 LAB — POCT URINALYSIS DIP (DEVICE)
Bilirubin Urine: NEGATIVE
Bilirubin Urine: NEGATIVE
Glucose, UA: NEGATIVE mg/dL
Hgb urine dipstick: NEGATIVE
Ketones, ur: NEGATIVE mg/dL
Ketones, ur: NEGATIVE mg/dL
Protein, ur: NEGATIVE mg/dL
Specific Gravity, Urine: <=1.005 (ref 1.005–1.030)
Specific Gravity, Urine: <=1.005 (ref 1.005–1.030)
Urobilinogen, UA: 0.2 mg/dL (ref 0.0–1.0)

## 2010-06-07 LAB — POCT URINALYSIS DIP (DEVICE)
Bilirubin Urine: NEGATIVE
Glucose, UA: NEGATIVE mg/dL
Hgb urine dipstick: NEGATIVE
Ketones, ur: NEGATIVE mg/dL
Specific Gravity, Urine: <=1.005 (ref 1.005–1.030)
pH: 6 (ref 5.0–8.0)

## 2010-06-08 LAB — POCT URINALYSIS DIP (DEVICE)
Bilirubin Urine: NEGATIVE
Bilirubin Urine: NEGATIVE
Glucose, UA: NEGATIVE mg/dL
Ketones, ur: NEGATIVE mg/dL
Nitrite: NEGATIVE
Protein, ur: NEGATIVE mg/dL
Specific Gravity, Urine: 1.01 (ref 1.005–1.030)
pH: 7.5 (ref 5.0–8.0)
pH: 7.5 (ref 5.0–8.0)

## 2010-06-09 ENCOUNTER — Encounter: Payer: BC Managed Care – PPO | Admitting: Family Medicine

## 2010-06-09 LAB — POCT URINALYSIS DIP (DEVICE): Protein, ur: 30 mg/dL — AB

## 2010-06-10 LAB — POCT URINALYSIS DIP (DEVICE)
Bilirubin Urine: NEGATIVE
Glucose, UA: NEGATIVE mg/dL
Glucose, UA: NEGATIVE mg/dL
Hgb urine dipstick: NEGATIVE
Nitrite: NEGATIVE
Nitrite: NEGATIVE
Specific Gravity, Urine: 1.02 (ref 1.005–1.030)
Urobilinogen, UA: 0.2 mg/dL (ref 0.0–1.0)

## 2010-06-17 ENCOUNTER — Encounter: Payer: Self-pay | Admitting: Family Medicine

## 2010-06-22 ENCOUNTER — Ambulatory Visit (INDEPENDENT_AMBULATORY_CARE_PROVIDER_SITE_OTHER): Payer: BC Managed Care – PPO | Admitting: Family Medicine

## 2010-06-22 ENCOUNTER — Encounter: Payer: Self-pay | Admitting: Family Medicine

## 2010-06-22 ENCOUNTER — Other Ambulatory Visit (HOSPITAL_COMMUNITY)
Admission: RE | Admit: 2010-06-22 | Discharge: 2010-06-22 | Disposition: A | Discharge status: Home / Self Care | Payer: BC Managed Care – PPO | Source: Ambulatory Visit | Attending: Family Medicine | Admitting: Family Medicine

## 2010-06-22 VITALS — BP 140/98 | HR 75 | Temp 98.9°F | Wt 131.0 lb

## 2010-06-22 DIAGNOSIS — Z01419 Encounter for gynecological examination (general) (routine) without abnormal findings: Secondary | ICD-10-CM | POA: Insufficient documentation

## 2010-06-22 DIAGNOSIS — Z124 Encounter for screening for malignant neoplasm of cervix: Secondary | ICD-10-CM

## 2010-06-22 DIAGNOSIS — I10 Essential (primary) hypertension: Secondary | ICD-10-CM

## 2010-06-22 NOTE — Patient Instructions (Signed)
Follow up in 6 months to see how your blood pressure is doing. Continue to exercise and avoid salt as we have discussed. Avoid medications containing pseudoephedrine (Sudafed) as they can raise your blood pressure.  Ibuprofen can also raise your blood pressure - try to avoid it if you can. Continue to work on ways to reduce stress.  If you continue to have elevated blood pressures we may look at your kidneys in the future to make sure that they are not causing your high blood pressure.

## 2010-06-23 NOTE — Assessment & Plan Note (Signed)
Advised regarding low sodium diet (handout given.) Counseled on diet and exercise for 15 minutes. Advised regarding stress reduction - suspect that stress is contributing greatly. Labs with normal renal function in March 2012. Consider renal artery ultrasound if blood pressures remain high, though much more likely to be familial hypertension. TSH has been normal less than one year ago - will not recheck today. Follow up in 6 months. Will not restart medication at this time. Consider baseline EKG as well.

## 2010-06-23 NOTE — Progress Notes (Signed)
  Subjective:     Paige Neal is a 30 y.o. female and is here for a comprehensive physical exam.  1) Hypertension: History of hypertension, not on medications currently (has been on HCTZ in the past, however this was discontinued during her last pregnancy). Checks BP at home with wrist cuff - 120s-150s / 70s-90s (checked against arm cuff at her job and correlated well). Reports increased stress with trying to balance full-time job, part-time job, school, and child - this has gotten better since she has cut back on school. She had felt that she was putting too much on herself - felt highly stressed at times. Does not exercise ("don't have time), does not monitor diet. Strong family history of hypertension. Reviewed recent labs - normal electrolytes and renal function.   Does not smoke or use illicit substances.     The following portions of the patient's history were reviewed and updated as appropriate: allergies, current medications, past family history, past medical history, past social history, past surgical history and problem list.  Review of Systems Pertinent items are noted in HPI.  Specifically denies chest pain, dyspnea, LE edema, headache, vision change, urinary changes   Objective:    BP 140/98  Pulse 75  Temp(Src) 98.9 F (37.2 C) (Oral)  Wt 131 lb (59.421 kg)  LMP 06/05/2010 General appearance: alert, cooperative, appears stated age and no distress Eyes: conjunctivae/corneas clear. PERRL, EOM's intact. Fundi benign. Ears: normal TM's and external ear canals both ears Nose: Nares normal. Septum midline. Mucosa normal. No drainage or sinus tenderness. Throat: lips, mucosa, and tongue normal; teeth and gums normal Neck: no adenopathy, no carotid bruit, no JVD, supple, symmetrical, trachea midline and thyroid not enlarged, symmetric, no tenderness/mass/nodules Lungs: clear to auscultation bilaterally Breasts: normal appearance, no masses or tenderness Heart: regular  rate and rhythm, S1, S2 normal, no murmur, click, rub or gallop Abdomen: soft, non-tender; bowel sounds normal; no masses,  no organomegaly Pelvic: cervix normal in appearance, external genitalia normal, no adnexal masses or tenderness, no bladder tenderness, no cervical motion tenderness, rectovaginal septum normal, urethra without abnormality or discharge, uterus normal size, shape, and consistency and vagina normal without discharge Extremities: extremities normal, atraumatic, no cyanosis or edema Pulses: 2+ and symmetric Neurologic: Alert and oriented X 3, normal strength and tone. Normal symmetric reflexes. Normal coordination and gait    Assessment:    Healthy female exam. Hypertension discussed as below.     Plan:     See After Visit Summary for Counseling Recommendations   Please note that patient desires yearly Pap smear (though has had three consecutive normals) and breast exam. Discussed risk / benefit - she would like to proceed with yearly exams.

## 2010-06-27 ENCOUNTER — Encounter: Payer: Self-pay | Admitting: Family Medicine

## 2010-09-30 ENCOUNTER — Encounter: Payer: Self-pay | Admitting: Family Medicine

## 2010-09-30 ENCOUNTER — Ambulatory Visit (INDEPENDENT_AMBULATORY_CARE_PROVIDER_SITE_OTHER): Payer: BC Managed Care – PPO | Admitting: Family Medicine

## 2010-09-30 ENCOUNTER — Other Ambulatory Visit: Payer: Self-pay

## 2010-09-30 ENCOUNTER — Ambulatory Visit (HOSPITAL_COMMUNITY)
Admission: RE | Admit: 2010-09-30 | Discharge: 2010-09-30 | Disposition: A | Discharge status: Home / Self Care | Payer: BC Managed Care – PPO | Source: Ambulatory Visit | Attending: Family Medicine | Admitting: Family Medicine

## 2010-09-30 DIAGNOSIS — R0789 Other chest pain: Secondary | ICD-10-CM

## 2010-09-30 DIAGNOSIS — I10 Essential (primary) hypertension: Secondary | ICD-10-CM | POA: Insufficient documentation

## 2010-09-30 LAB — BASIC METABOLIC PANEL
BUN: 12 mg/dL (ref 6–23)
CO2: 28 mEq/L (ref 19–32)
Chloride: 102 mEq/L (ref 96–112)
Creat: 0.69 mg/dL (ref 0.50–1.10)
Glucose, Bld: 82 mg/dL (ref 70–99)
Potassium: 4.2 mEq/L (ref 3.5–5.3)

## 2010-09-30 MED ORDER — HYDROCHLOROTHIAZIDE 12.5 MG PO CAPS: 12.5000 mg | ORAL_CAPSULE | Freq: Every day | ORAL | Status: DC

## 2010-09-30 NOTE — Assessment & Plan Note (Signed)
Occurs with stress, very likely PVCs or panic attacks.  EKG today.  Holter monitor to assess.  Will discuss stress reduction.  Would start med if became limiting

## 2010-09-30 NOTE — Progress Notes (Signed)
  Subjective:    Patient ID: Paige Neal, female    DOB: Aug 23, 1980, 30 y.o.   MRN: [id number]  HPI HTN-  Despite weight loss, some exercise, diet changes, BP still high.  Not avoiding stress.  No SOB.  Has some chest pain episodes with stress that involve tightness in chest, heart palpitations and lump in throat.  Usually go away within 10-30 min.  Not limiting to her.   Review of Systems Denies SOB, HA, N/V/D, fever     Objective:   Physical Exam Vital signs reviewed General appearance - alert, well appearing, and in no distress and oriented to person, place, and time Heart - normal rate, regular rhythm, normal S1, S2, no murmurs, rubs, clicks or gallops Chest - clear to auscultation, no wheezes, rales or rhonchi, symmetric air entry, no tachypnea, retractions or cyanosis Extremities - peripheral pulses normal, no pedal edema, no clubbing or cyanosis        Assessment & Plan:

## 2010-09-30 NOTE — Patient Instructions (Signed)
It was nice to meet you today  We will start a medicine called HCTZ  I will see you back in 7-10 days for follow up  We will call you with the appt for the monitor

## 2010-09-30 NOTE — Assessment & Plan Note (Signed)
Continues despite weight loss and diet changes.  Start HCTZ today.  cehck BMET.  See back in 7-10 days

## 2010-10-04 ENCOUNTER — Telehealth: Payer: Self-pay

## 2010-10-04 NOTE — Telephone Encounter (Signed)
Patient call back and made appointment for 8/16 @2 :30 pm

## 2010-10-05 ENCOUNTER — Encounter: Payer: Self-pay | Admitting: Family Medicine

## 2010-10-05 ENCOUNTER — Telehealth: Payer: Self-pay | Admitting: Family Medicine

## 2010-10-05 ENCOUNTER — Ambulatory Visit (INDEPENDENT_AMBULATORY_CARE_PROVIDER_SITE_OTHER): Payer: BC Managed Care – PPO | Admitting: Family Medicine

## 2010-10-05 DIAGNOSIS — R519 Headache, unspecified: Secondary | ICD-10-CM | POA: Insufficient documentation

## 2010-10-05 DIAGNOSIS — R51 Headache: Secondary | ICD-10-CM

## 2010-10-05 DIAGNOSIS — H539 Unspecified visual disturbance: Secondary | ICD-10-CM

## 2010-10-05 NOTE — Assessment & Plan Note (Signed)
HA is frontal, had short episode of change in vision with return to normal vision, no focal finding.  Has had headaches like this in the past but vision change is new.  No red flags today.  Will see back in 5 days for HTN check and will review HAs then. precepted with Dr. Earnest Bailey.

## 2010-10-05 NOTE — Telephone Encounter (Signed)
While watching TV last night patient experienced visual changes in her right eye followed by an intense headache.  Says that the entire episode last about 10 minutes.  Denies any other symptoms such as facial drooping, speech changes, or hemiparesis.  This is the first time she has ever experienced anything like this.  She has a diagnosis of HTN and was recently started on HCTZ.  She was concerned that it was a stroke.  Told her this was unlikely but advised her to be seen.  Gave her a SDA appointment with her PCP for this afternoon.

## 2010-10-05 NOTE — Progress Notes (Signed)
SUBJECTIVE: Paige Neal is a 30 y.o. female who complains of headaches for 1 day(s). Description of pain: throbbing pain, bilateral in the frontal area. Duration of individual headaches: 30 minute(s), frequency once. Associated symptoms: nausea and blotch in vision. Pain relief: acetaminophen and ibuprofen. Precipitating factors: patient is aware of none. She denies a history of recent head injury.  Prior neurological history: negative for no neurological problems. Neurologic Review of Systems - no TIA or stroke-like symptoms.    OBJECTIVE: Appearance: alert, well appearing, and in no distress, oriented to person, place, and time, normal appearing weight and well hydrated. Neurological Exam: alert, oriented, normal speech, no focal findings or movement disorder noted, screening mental status exam normal, neck supple without rigidity, cranial nerves II through XII intact, funduscopic exam normal, discs flat and sharp, DTR's normal and symmetric, motor and sensory grossly normal bilaterally, normal muscle tone, no tremors, strength 5/5.  ASSESSMENT: mixed tension and migraine headache, unlikely to have organic CNS lesion from H & P.  PLAN: Recommendations: episodic therapy with NSAID's due to low frequency of pain, asked to keep headache diary and patient reassured that neurodiagnostic workup not indicated from benign H & P. See orders for this visit as documented in the electronic medical record.

## 2010-10-05 NOTE — Telephone Encounter (Signed)
Error

## 2010-10-05 NOTE — Patient Instructions (Signed)
Right now there is nothing too concerning about your headache.    I want you to call me if you have another headache or if you notice other findings like weakness or trouble with speech.  Come back and see me in one week for a blood pressure check

## 2010-10-13 ENCOUNTER — Ambulatory Visit (INDEPENDENT_AMBULATORY_CARE_PROVIDER_SITE_OTHER): Payer: BC Managed Care – PPO | Admitting: Family Medicine

## 2010-10-13 ENCOUNTER — Encounter: Payer: Self-pay | Admitting: Family Medicine

## 2010-10-13 VITALS — BP 134/73 | HR 68 | Temp 98.6°F | Ht 62.5 in | Wt 129.8 lb

## 2010-10-13 DIAGNOSIS — I10 Essential (primary) hypertension: Secondary | ICD-10-CM

## 2010-10-13 LAB — BASIC METABOLIC PANEL
BUN: 14 mg/dL (ref 6–23)
Calcium: 10.1 mg/dL (ref 8.4–10.5)
Creat: 0.79 mg/dL (ref 0.50–1.10)

## 2010-10-13 NOTE — Assessment & Plan Note (Signed)
At goal.  Recheck bmet today.  Recheck BP in one month then space to q 3months

## 2010-10-13 NOTE — Progress Notes (Signed)
  Subjective:    Patient ID: Paige Neal, female    DOB: 1980-10-14, 30 y.o.   MRN: [id number]  HPI  HTN- taking med, no problems.  Not checking BP at home.    HA- mild daily HA worse when she waits to eat.  No vision change, dizziness.  Frontal HA. Not like her migraines.  Taking tylenol 3 times in one week this week, but usually jsut once  Review of Systems Denies CP, SOB, N/V/D, fever     Objective:   Physical Exam  Vital signs reviewed General appearance - alert, well appearing, and in no distress and oriented to person, place, and time Heart - normal rate, regular rhythm, normal S1, S2, no murmurs, rubs, clicks or gallops Chest - clear to auscultation, no wheezes, rales or rhonchi, symmetric air entry, no tachypnea, retractions or cyanosis       Assessment & Plan:  HYPERTENSION At goal.  Recheck bmet today.  Recheck BP in one month then space to q 3months

## 2010-10-13 NOTE — Patient Instructions (Signed)
I will see you in one month Your son currently has Dr. Konrad Dolores listed He will be here 3 years.  You can switch both to me or to him

## 2010-10-14 ENCOUNTER — Encounter (INDEPENDENT_AMBULATORY_CARE_PROVIDER_SITE_OTHER): Payer: BC Managed Care – PPO

## 2010-10-14 DIAGNOSIS — R079 Chest pain, unspecified: Secondary | ICD-10-CM

## 2010-10-18 ENCOUNTER — Telehealth: Payer: Self-pay | Admitting: Family Medicine

## 2010-10-18 DIAGNOSIS — I471 Supraventricular tachycardia: Secondary | ICD-10-CM

## 2010-10-18 NOTE — Telephone Encounter (Signed)
Left VM to call for message.  The read from her holter monitor is back. It did show an arrhythmia that may be why she is having panic attacks.  The cardiologist who read it rec that she be seen by a cardiologist who specializes in this.  I have put in referrral for this

## 2010-10-19 ENCOUNTER — Telehealth: Payer: Self-pay | Admitting: Family Medicine

## 2010-10-19 NOTE — Telephone Encounter (Signed)
Would like to speak with someone about the message from yesterday pertaining to the Arrythmia and the referral to a Cardiologist.

## 2010-10-19 NOTE — Telephone Encounter (Signed)
Spoke with pt and informed of appt.  She will call her insurance and make sure they are in [employer]

## 2010-10-22 ENCOUNTER — Encounter: Payer: Self-pay | Admitting: Cardiovascular Disease

## 2010-10-25 ENCOUNTER — Encounter: Payer: Self-pay | Admitting: Cardiovascular Disease

## 2010-10-25 ENCOUNTER — Ambulatory Visit (INDEPENDENT_AMBULATORY_CARE_PROVIDER_SITE_OTHER): Payer: BC Managed Care – PPO | Admitting: Cardiovascular Disease

## 2010-10-25 ENCOUNTER — Encounter: Payer: Self-pay | Admitting: *Deleted

## 2010-10-25 DIAGNOSIS — R0789 Other chest pain: Secondary | ICD-10-CM

## 2010-10-25 DIAGNOSIS — F419 Anxiety disorder, unspecified: Secondary | ICD-10-CM

## 2010-10-25 DIAGNOSIS — I10 Essential (primary) hypertension: Secondary | ICD-10-CM

## 2010-10-25 DIAGNOSIS — I498 Other specified cardiac arrhythmias: Secondary | ICD-10-CM

## 2010-10-25 DIAGNOSIS — R002 Palpitations: Secondary | ICD-10-CM

## 2010-10-25 DIAGNOSIS — I471 Supraventricular tachycardia: Secondary | ICD-10-CM

## 2010-10-25 DIAGNOSIS — R079 Chest pain, unspecified: Secondary | ICD-10-CM

## 2010-10-25 DIAGNOSIS — R000 Tachycardia, unspecified: Secondary | ICD-10-CM | POA: Insufficient documentation

## 2010-10-25 DIAGNOSIS — F411 Generalized anxiety disorder: Secondary | ICD-10-CM

## 2010-10-25 MED ORDER — PROPRANOLOL HCL 10 MG PO TABS: ORAL_TABLET | ORAL | Status: DC

## 2010-10-25 NOTE — Patient Instructions (Signed)
Your physician has requested that you have a stress echocardiogram. For further information please visit [email or web address] Please follow instruction sheet as given.   START INDERAL AS NEEDED FOR RAPID HEART RATE  REFER TO EP-SVT

## 2010-10-25 NOTE — Assessment & Plan Note (Signed)
She focuses on SSCP and stress and BP more.  PRN inderal.  Don't think any other Rx needed at this time.  Given documented SVT will have her see EPS.  Not sure if anxiety attacks are actually this but instructed her to take inderal for anxiety attack and see if it helps.  May benefit from event monitor in future since her anxiety attacks occur about twice a month

## 2010-10-25 NOTE — Assessment & Plan Note (Signed)
F/U stress echo in light of SVT and family history

## 2010-10-25 NOTE — Progress Notes (Signed)
30 yo referred by primary for palpitations.  Event monitor from earlier this  month reviewed.  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  Previous history of atypical chest pain and HTN.  On HCTZ for BP.  Elevated BP and "anxiety" attacks seem related to stress.  Works at [employer] and has a relatively new job description.  Father of her two yo son is living with her.  Not married some stress there as well.  Mother had premature CAD.  Mother and sisters have elevated BP.  Takes her BP at home.  Diastolic around 90 before diuretic started  ROS: Denies fever, malais, weight loss, blurry vision, decreased visual acuity, cough, sputum, SOB, hemoptysis, pleuritic pain, , heartburn, abdominal pain, melena, lower extremity edema, claudication, or rash.  All other systems reviewed and negative   General: Affect appropriate Healthy:  appears stated age HEENT: normal Neck supple with no adenopathy JVP normal no bruits no thyromegaly Lungs clear with no wheezing and good diaphragmatic motion Heart:  S1/S2 no murmur,rub, gallop or click PMI normal Abdomen: benighn, BS positve, no tenderness, no AAA no bruit.  No HSM or HJR Distal pulses intact with no bruits No edema Neuro non-focal Skin warm and dry No muscular weakness  Medications Current Outpatient Prescriptions  Medication Sig Dispense Refill  . fluticasone (FLONASE) 50 MCG/ACT nasal spray 2 sprays by Nasal route daily.        . hydrochlorothiazide (MICROZIDE) 12.5 MG capsule Take 1 capsule (12.5 mg total) by mouth daily.  30 capsule  11    Allergies Review of patient's allergies indicates no known allergies.  Family History: Family History  Problem Relation Age of Onset  . Coronary artery disease Mother   . Hypertension Mother     Social History: History   Social History  . Marital Status: Single    Spouse Name: N/A    Number of Children: N/A  . Years of Education: N/A   Occupational History  . Not on file.   Social  History Main Topics  . Smoking status: Never Smoker   . Smokeless tobacco: Not on file  . Alcohol Use: No  . Drug Use: No  . Sexually Active: Not on file   Other Topics Concern  . Not on file   Social History Narrative  . No narrative on file    Electrocardiogram: 09/30/10  NSR 62 normal ECG  QT 420  Assessment and Plan

## 2010-10-25 NOTE — Assessment & Plan Note (Signed)
Continue HCTZ/  Consider beta blocker if palpitations get worse

## 2010-10-25 NOTE — Assessment & Plan Note (Signed)
Discussed lifestyle modification.  Consider Prosac or Cymbalta per primary

## 2010-10-29 ENCOUNTER — Ambulatory Visit (HOSPITAL_BASED_OUTPATIENT_CLINIC_OR_DEPARTMENT_OTHER): Payer: BC Managed Care – PPO | Admitting: Radiology

## 2010-10-29 ENCOUNTER — Encounter: Payer: Self-pay | Admitting: *Deleted

## 2010-10-29 ENCOUNTER — Ambulatory Visit (HOSPITAL_COMMUNITY): Payer: BC Managed Care – PPO | Attending: Cardiovascular Disease | Admitting: Radiology

## 2010-10-29 DIAGNOSIS — I10 Essential (primary) hypertension: Secondary | ICD-10-CM | POA: Insufficient documentation

## 2010-10-29 DIAGNOSIS — R5381 Other malaise: Secondary | ICD-10-CM | POA: Insufficient documentation

## 2010-10-29 DIAGNOSIS — I471 Supraventricular tachycardia: Secondary | ICD-10-CM

## 2010-10-29 DIAGNOSIS — R072 Precordial pain: Secondary | ICD-10-CM | POA: Insufficient documentation

## 2010-10-29 DIAGNOSIS — R0989 Other specified symptoms and signs involving the circulatory and respiratory systems: Secondary | ICD-10-CM

## 2010-10-29 DIAGNOSIS — R011 Cardiac murmur, unspecified: Secondary | ICD-10-CM | POA: Insufficient documentation

## 2010-11-04 ENCOUNTER — Telehealth: Payer: Self-pay | Admitting: Cardiovascular Disease

## 2010-11-04 NOTE — Telephone Encounter (Signed)
Pt calling re test results, and wants to know what kind of arrhythmias she has?

## 2010-11-04 NOTE — Telephone Encounter (Signed)
Spoke with pt, she is aware of stress test results. She is aware monitor showed SVT. She has an appt with dr allred 12-02-10. Deliah Goody

## 2010-11-25 ENCOUNTER — Encounter: Payer: Self-pay | Admitting: Family Medicine

## 2010-11-25 ENCOUNTER — Ambulatory Visit (INDEPENDENT_AMBULATORY_CARE_PROVIDER_SITE_OTHER): Payer: BC Managed Care – PPO | Admitting: Family Medicine

## 2010-11-25 VITALS — BP 130/84 | HR 83 | Temp 99.2°F | Wt 128.6 lb

## 2010-11-25 DIAGNOSIS — Z23 Encounter for immunization: Secondary | ICD-10-CM

## 2010-11-25 DIAGNOSIS — R002 Palpitations: Secondary | ICD-10-CM

## 2010-11-25 DIAGNOSIS — I10 Essential (primary) hypertension: Secondary | ICD-10-CM

## 2010-11-25 NOTE — Patient Instructions (Signed)
Great that you are doing better Please come back in 6 months or earlier if necessary

## 2010-11-28 NOTE — Progress Notes (Signed)
  Subjective:    Patient ID: Paige Neal, female    DOB: March 20, 1980, 30 y.o.   MRN: [id number]  HPI  HTN-taking HCTZ no problem.  Does not check BP at home.  HA are significantly better recently, which she attributes to blood pressure control.    Palpitations- pt notes that she feels much better if she takes inderal during palpitations.  She doesn't always take it, but no side effects are noted.    Review of Systems Denies CP, N/V/D    Objective:   Physical Exam Vital signs reviewed General appearance - alert, well appearing, and in no distress and oriented to person, place, and time Heart - normal rate, regular rhythm, normal S1, S2, no murmurs, rubs, clicks or gallops Chest - clear to auscultation, no wheezes, rales or rhonchi, symmetric air entry, no tachypnea, retractions or cyanosis        Assessment & Plan:

## 2010-11-28 NOTE — Assessment & Plan Note (Signed)
Has appt on 10/4 with EP for evaluation.  Inderal helps when she takes it during palpitations

## 2010-11-28 NOTE — Assessment & Plan Note (Signed)
BP Readings from Last 3 Encounters:  11/25/10 130/84  10/25/10 114/76  10/13/10 134/73   Bp at goal.  Pt has been feeling better now that she is taking the HCTZ>  See back in 6 months

## 2010-12-02 ENCOUNTER — Ambulatory Visit (INDEPENDENT_AMBULATORY_CARE_PROVIDER_SITE_OTHER): Payer: BC Managed Care – PPO | Admitting: Internal Medicine

## 2010-12-02 ENCOUNTER — Encounter: Payer: Self-pay | Admitting: Internal Medicine

## 2010-12-02 VITALS — BP 110/70 | HR 76 | Resp 18 | Ht 66.0 in | Wt 128.0 lb

## 2010-12-02 DIAGNOSIS — R000 Tachycardia, unspecified: Secondary | ICD-10-CM

## 2010-12-02 NOTE — Assessment & Plan Note (Signed)
The patient has nonsustained episodes of tachycardia documented by her recent holter monitor.  The tachycardia is a very clear long RP tachycardia and the P wave looks very similar to sinus.  The tachycardias end rather abruptly however.  Ddx includes appropriate sinus tachycardia (due to anxiety/ environmental cues), sinus node reentry, inappropriate sinus tach (less likely), or atrial tachycardia.  She is doing very well with prn beta blocker therapy and symptomatic episodes are infrequent.  I would recommend that she continue her current regimen of as needed beta blockers.  I would not recommend EP study, ablation, or further EP testing at this time.  If episodes worsen, I would be happy to see her again, otherwise she will return as needed.

## 2010-12-02 NOTE — Patient Instructions (Signed)
Your physician recommends that you schedule a follow-up appointment as needed  

## 2010-12-02 NOTE — Progress Notes (Signed)
Paige Neal is a pleasant 30 y.o. [facility] patient with a h/o atypical chest pain and palpitations who presents today for EP consultation.  She reports having sharp "pin needle" type chest pain.  This occurs predominantly with stress.  She had a normal stress echo.  She also had a holter monitor placed.  She does not recall having "heart racing" or an anxiety attack while wearing the monitor.  She does recall having work stress while wearing it.  This monitor revealed long RP tachycardia, likely sinus mechanism.  She was given inderal to take on an as needed basis.  She reports that this has "worked wonders".  She has only had to take this medicine twice and feels that it "calmed her down". She describes her panic attacks as a gradual increase in "stress" followed by difficulty breathing a tighness in her throat.  This lasts 30 seconds to a minute and gradually subsides.  She finds that episodes are brought on by stress.  Episodes terminate by relaxing and deep breathing.  Today, she denies symptoms of palpitations, shortness of breath, lower extremity edema, dizziness, presyncope, syncope, or neurologic sequela. The patient is tolerating medications without difficulties and is otherwise without complaint today.   Past Medical History  Diagnosis Date  . Panic attack   . HTN (hypertension)   . Hyperemesis gravidarum   . Tachycardia    Past Surgical History  Procedure Date  . Breast fibroadenoma surgery   . Gardisil x 3 - 08/30/2005     Current Outpatient Prescriptions  Medication Sig Dispense Refill  . hydrochlorothiazide (MICROZIDE) 12.5 MG capsule Take 1 capsule (12.5 mg total) by mouth daily.  30 capsule  11  . Prenatal Vit-Fe Fumarate-FA (PRENATAL 1+1 PO) 1 or 2 tabs po qd       . propranolol (INDERAL) 10 MG tablet As needed  30 tablet  12    No Known Allergies  History   Social History  . Marital Status: Single    Spouse Name: N/A    Number of Children: N/A  . Years of Education:  N/A   Occupational History  . Not on file.   Social History Main Topics  . Smoking status: Never Smoker   . Smokeless tobacco: Not on file  . Alcohol Use: No  . Drug Use: No  . Sexually Active: Not on file   Other Topics Concern  . Not on file   Social History Narrative   Pt lives in [place] and works at [employer],  single    Family History  Problem Relation Age of Onset  . Coronary artery disease Mother   . Hypertension Mother     ROS- All systems are reviewed and negative except as per the HPI above  Physical Exam: Filed Vitals:   12/02/10 1149  BP: 110/70  Pulse: 76  Resp: 18  Height: 5\' 6"  (1.676 m)  Weight: 128 lb (58.06 kg)    GEN- The patient is well appearing, alert and oriented x 3 today.   Head- normocephalic, atraumatic Eyes-  Sclera clear, conjunctiva pink Ears- hearing intact Oropharynx- clear Neck- supple, no JVP Lymph- no cervical lymphadenopathy Lungs- Clear to ausculation bilaterally, normal work of breathing Heart- Regular rate and rhythm, no murmurs, rubs or gallops, PMI not laterally displaced GI- soft, NT, ND, + BS Extremities- no clubbing, cyanosis, or edema MS- no significant deformity or atrophy Skin- no rash or lesion Psych- euthymic mood, full affect Neuro- strength and sensation are intact  EKG today  reveals sinus rhythm 74 bpm, otherwise normal ekg  Assessment and Plan:

## 2010-12-09 ENCOUNTER — Telehealth: Payer: Self-pay | Admitting: Internal Medicine

## 2010-12-09 NOTE — Telephone Encounter (Signed)
Pt calling wanting to know if she can increase propranolol 10 mg, pt said for some reason pt is continuing that have back to back to back panic attacks. Pt doesn't have if RX is working at just 10 mg. Please return pt call to discuss further.  Pt said when she was driving she felt light headed like she was about to pass out.

## 2010-12-09 NOTE — Telephone Encounter (Signed)
Spoke with pt, she reports on Tuesday this week she had back to back episodes of panic and heart racing. She took one dose of the inderal and it helped some but not completely. She was driving and felt lightheaded but put down the window and the air helped. She has felt ok since Tuesday and wonders if it happens again what to do. According to the PDR the pt can take 10-30 mg 3 to 4 times daily if needed. She will call back if happens again to get a follow up appt Deliah Goody

## 2010-12-09 NOTE — Telephone Encounter (Signed)
Pt calling asking that nurse return call. Please call back.

## 2011-06-24 ENCOUNTER — Other Ambulatory Visit (HOSPITAL_COMMUNITY)
Admission: RE | Admit: 2011-06-24 | Discharge: 2011-06-24 | Disposition: A | Discharge status: Home / Self Care | Payer: BC Managed Care – PPO | Source: Ambulatory Visit | Attending: Family Medicine | Admitting: Family Medicine

## 2011-06-24 ENCOUNTER — Encounter: Payer: Self-pay | Admitting: Family Medicine

## 2011-06-24 ENCOUNTER — Ambulatory Visit (INDEPENDENT_AMBULATORY_CARE_PROVIDER_SITE_OTHER): Payer: BC Managed Care – PPO | Admitting: Family Medicine

## 2011-06-24 VITALS — BP 118/75 | HR 90 | Temp 98.8°F | Ht 66.0 in | Wt 129.0 lb

## 2011-06-24 DIAGNOSIS — N898 Other specified noninflammatory disorders of vagina: Secondary | ICD-10-CM

## 2011-06-24 DIAGNOSIS — Z113 Encounter for screening for infections with a predominantly sexual mode of transmission: Secondary | ICD-10-CM | POA: Insufficient documentation

## 2011-06-24 DIAGNOSIS — Z0000 Encounter for general adult medical examination without abnormal findings: Secondary | ICD-10-CM

## 2011-06-24 DIAGNOSIS — Z23 Encounter for immunization: Secondary | ICD-10-CM

## 2011-06-24 DIAGNOSIS — Z01419 Encounter for gynecological examination (general) (routine) without abnormal findings: Secondary | ICD-10-CM

## 2011-06-24 DIAGNOSIS — I10 Essential (primary) hypertension: Secondary | ICD-10-CM

## 2011-06-24 DIAGNOSIS — N63 Unspecified lump in unspecified breast: Secondary | ICD-10-CM

## 2011-06-24 LAB — POCT WET PREP (WET MOUNT): WBC, Wet Prep HPF POC: >20

## 2011-06-24 NOTE — Assessment & Plan Note (Signed)
Consistent with BV on history. Check gonorrhea Chlamydia and wet prep today. We'll call with results.

## 2011-06-24 NOTE — Assessment & Plan Note (Signed)
Having unprotected sex with monogamous partner. Not planning pregnancy but does not desire further birth control options. We'll check gonorrhea Chlamydia and wet prep today.

## 2011-06-24 NOTE — Assessment & Plan Note (Signed)
At goal, continue current management

## 2011-06-24 NOTE — Progress Notes (Addendum)
  Subjective:     Paige Neal is a 31 y.o. female here for a routine exam.  Current complaints: Vaginal discharge-she complains 1 month vaginal discharge and odor. This is not related to her cycle in character. It is not itchy. She is currently monogamous relationship. They're not using condoms every time. She is aware that she is at risk for pregnancy but does not want to use other birth control.   Gynecologic History Patient's last menstrual period was 06/08/2011. Contraception: condoms Last Pap: 2012. Results were: normal     The following portions of the patient's history were reviewed and updated as appropriate: allergies, current medications, past family history, past medical history, past social history, past surgical history and problem list.  Review of Systems Pertinent items are noted in HPI.    Objective:    BP 118/75  Pulse 90  Temp(Src) 98.8 F (37.1 C) (Oral)  Ht 5\' 6"  (1.676 m)  Wt 129 lb (58.514 kg)  BMI 20.82 kg/m2  LMP 06/08/2011 General appearance: alert and cooperative Lungs: clear to auscultation bilaterally Breasts:  no irregularities beyond a 1/2 cm scar on the left lower breast, fibroadenoma excision--possible small mass under scar, mobile, nontender.  No nipple changes.  Heart: regular rate and rhythm, S1, S2 normal, no murmur, click, rub or gallop Abdomen: soft, non-tender; bowel sounds normal; no masses,  no organomegaly Pelvic: cervix normal in appearance, external genitalia normal, no adnexal masses or tenderness, no bladder tenderness, no cervical motion tenderness, uterus normal size, shape, and consistency and vagina normal without discharge Extremities: extremities normal, atraumatic, no cyanosis or edema Skin: Skin color, texture, turgor normal. No rashes or lesions    Assessment:    Healthy female exam.    Plan:    Education reviewed: low fat, low cholesterol diet, safe sex/STD prevention and weight bearing exercise. Follow up in: 1  year.

## 2011-06-25 ENCOUNTER — Telehealth: Payer: Self-pay | Admitting: Family Medicine

## 2011-06-25 MED ORDER — METRONIDAZOLE 500 MG PO TABS: 500.0000 mg | ORAL_TABLET | Freq: Two times a day (BID) | ORAL | Status: AC

## 2011-06-25 NOTE — Telephone Encounter (Signed)
pls call pt and let her know test shows BV.  Flagyl called in to pharmacy.

## 2011-06-27 DIAGNOSIS — N63 Unspecified lump in unspecified breast: Secondary | ICD-10-CM

## 2011-06-27 HISTORY — DX: Unspecified lump in unspecified breast: N63.0

## 2011-06-27 NOTE — Telephone Encounter (Signed)
lmovm informing pt of appt time/date. Nic Lampe, Maryjo Rochester

## 2011-06-27 NOTE — Assessment & Plan Note (Signed)
I. think this is likely to be related to her scar, however I will get this further evaluated with ultrasound and screening mammogram.

## 2011-06-27 NOTE — Progress Notes (Signed)
Addended by: Ellery Plunk L on: 06/27/2011 10:20 AM   Modules accepted: Orders

## 2011-06-27 NOTE — Progress Notes (Signed)
Addended by: Jone Baseman D on: 06/27/2011 11:47 AM   Modules accepted: Orders

## 2011-06-27 NOTE — Telephone Encounter (Signed)
Called pt to let her know that it was BV Also called to schedule breast u/s for small mass in right breast near scar.  I think this is scar tissue, but should be evaluated.

## 2011-06-30 ENCOUNTER — Other Ambulatory Visit: Payer: BC Managed Care – PPO

## 2011-07-05 ENCOUNTER — Ambulatory Visit
Admission: RE | Admit: 2011-07-05 | Discharge: 2011-07-05 | Disposition: A | Discharge status: Home / Self Care | Payer: BC Managed Care – PPO | Source: Ambulatory Visit | Attending: Family Medicine | Admitting: Family Medicine

## 2011-07-05 DIAGNOSIS — N63 Unspecified lump in unspecified breast: Secondary | ICD-10-CM

## 2011-10-26 ENCOUNTER — Other Ambulatory Visit: Payer: Self-pay | Admitting: Family Medicine

## 2011-10-26 NOTE — Telephone Encounter (Signed)
Refilled medication today.  Pt needs appointment as has not been seen since April to meet provider and f/u HTN.

## 2011-11-24 ENCOUNTER — Ambulatory Visit (INDEPENDENT_AMBULATORY_CARE_PROVIDER_SITE_OTHER): Payer: BC Managed Care – PPO | Admitting: Family Medicine

## 2011-11-24 ENCOUNTER — Encounter: Payer: Self-pay | Admitting: Family Medicine

## 2011-11-24 VITALS — BP 129/79 | HR 70 | Ht 66.0 in | Wt 138.0 lb

## 2011-11-24 DIAGNOSIS — F419 Anxiety disorder, unspecified: Secondary | ICD-10-CM

## 2011-11-24 DIAGNOSIS — Z309 Encounter for contraceptive management, unspecified: Secondary | ICD-10-CM | POA: Insufficient documentation

## 2011-11-24 DIAGNOSIS — F411 Generalized anxiety disorder: Secondary | ICD-10-CM

## 2011-11-24 DIAGNOSIS — I10 Essential (primary) hypertension: Secondary | ICD-10-CM

## 2011-11-24 MED ORDER — NORETHINDRONE 0.35 MG PO TABS: 1.0000 | ORAL_TABLET | Freq: Every day | ORAL | Status: DC

## 2011-11-24 NOTE — Assessment & Plan Note (Signed)
Pt with controlled BP, missed last week of HCTZ - Recommended continuing HCTZ for good BP control, and checking BP if feel dizzy or lightheaded.

## 2011-11-24 NOTE — Patient Instructions (Addendum)
It was good to meet you today. - Because of your history of high blood pressure, I recommend continuing hydrochlorothiazide unless you feel like your bp is low.   - For birth control, I recommend a progesterone-only pill if you are interested in pills.  I would stay away from pills with estrogen because of your history of high blood pressure and a migraine with an aura.  I sent a prescription for micronor to your pharmacy in [place]. - Please schedule a follow-up appointment with me in 1 month to see how you are doing.  Until you have been on the pill for >1 month, remember it will be important to use back-up protection like condoms to prevent pregnancy.  Take your birth control at the same time each day. - I have included information about mirena here so you can know more about it.

## 2011-11-24 NOTE — Assessment & Plan Note (Signed)
Pt desires OCP, previously used yasmin but not recommended with controlled HTN and h/o migraine with aura - Prescribed micronor (progesterone only ocp) - Recommended condom use for 1 month or more while taking micronor for backup coverage when starting OCP, as pt has been off OCP for some time - F/u in 1 month - Gave information on mirena as patient was curious

## 2011-11-24 NOTE — Progress Notes (Signed)
Subjective:     Patient ID: Yuktha Kerchner, female   DOB: 1980-08-28, 31 y.o.   MRN: [id number]  HPI  This is a 31 y.o. female with h/o HTN here to discuss birth control options.  Pt was previously using Yasmin and was interested in staying with a pill option.  However, h/o HTN and one previous migraine with visual aura.  Reports no h/o thromboembolic events or strokes.  BP: Pt was controlled on HCTZ but was not taking for the past week because she initially forgot and then noted her BPs to be controlled between 104-121/60-82.  Has home BP monitor.  Had 24 hour holter monitor last year due to cp and panic attacks, and d/o tachycardia, given propranolol prn for tachycardia and palpitations.  Review of Systems Denies dizziness, CP, SOB.    PMH:  - HTN  Meds: propranolol, HCTZ  Surgeries:  Right breast benign lumpectomy when in teens  OB hx: G1P1001  SH: works at [employer] Denies tobacco, drugs, alcohol use  FH: father with HTN, younger sister with HTN, mother with double bypass heart surgery in her 55s.    Objective:   Physical Exam BP 129/79  Pulse 70  Ht 5\' 6"  (1.676 m)  Wt 138 lb (62.596 kg)  BMI 22.27 kg/m2  LMP 11/10/2011 GEN: NAD CV: RRR, no m/r/g PULM: CTAB, nl effort ABD: NABS, soft, nontender, nondistended EXTR: no lower extremity edema    Assessment:     This is a 31 y.o. female with h/o HTN here to discuss birth control options.     Plan:

## 2011-11-24 NOTE — Assessment & Plan Note (Signed)
Pt reported h/o panic attacks with few symptoms currently. - Revisit at follow-up in 1 month to discuss if pt would benefit from CBT.

## 2011-12-28 ENCOUNTER — Ambulatory Visit: Payer: BC Managed Care – PPO | Admitting: Family Medicine

## 2012-01-23 ENCOUNTER — Ambulatory Visit: Payer: BC Managed Care – PPO | Admitting: Family Medicine

## 2012-03-05 ENCOUNTER — Ambulatory Visit (INDEPENDENT_AMBULATORY_CARE_PROVIDER_SITE_OTHER): Payer: BC Managed Care – PPO | Admitting: *Deleted

## 2012-03-05 DIAGNOSIS — Z23 Encounter for immunization: Secondary | ICD-10-CM

## 2012-06-08 ENCOUNTER — Other Ambulatory Visit: Payer: Self-pay | Admitting: *Deleted

## 2012-06-11 ENCOUNTER — Other Ambulatory Visit: Payer: Self-pay | Admitting: *Deleted

## 2012-06-11 MED ORDER — PROPRANOLOL HCL 10 MG PO TABS: 10.0000 mg | ORAL_TABLET | Freq: Every day | ORAL | Status: DC

## 2012-06-11 NOTE — Telephone Encounter (Signed)
NEED APPOINTMENT Fax Received. Refill Completed. Katori Wirsing Chowoe (R.M.A)   

## 2012-06-26 ENCOUNTER — Encounter: Payer: Self-pay | Admitting: Family Medicine

## 2012-06-26 ENCOUNTER — Other Ambulatory Visit (HOSPITAL_COMMUNITY)
Admission: RE | Admit: 2012-06-26 | Discharge: 2012-06-26 | Disposition: A | Discharge status: Home / Self Care | Payer: BC Managed Care – PPO | Source: Ambulatory Visit | Attending: Family Medicine | Admitting: Family Medicine

## 2012-06-26 ENCOUNTER — Ambulatory Visit (INDEPENDENT_AMBULATORY_CARE_PROVIDER_SITE_OTHER): Payer: BC Managed Care – PPO | Admitting: Family Medicine

## 2012-06-26 VITALS — BP 118/70 | Temp 98.6°F | Ht 66.0 in | Wt 134.6 lb

## 2012-06-26 DIAGNOSIS — I10 Essential (primary) hypertension: Secondary | ICD-10-CM

## 2012-06-26 DIAGNOSIS — R059 Cough, unspecified: Secondary | ICD-10-CM

## 2012-06-26 DIAGNOSIS — Z113 Encounter for screening for infections with a predominantly sexual mode of transmission: Secondary | ICD-10-CM | POA: Insufficient documentation

## 2012-06-26 DIAGNOSIS — Z01419 Encounter for gynecological examination (general) (routine) without abnormal findings: Secondary | ICD-10-CM

## 2012-06-26 DIAGNOSIS — Z309 Encounter for contraceptive management, unspecified: Secondary | ICD-10-CM

## 2012-06-26 DIAGNOSIS — R058 Other specified cough: Secondary | ICD-10-CM | POA: Insufficient documentation

## 2012-06-26 DIAGNOSIS — R509 Fever, unspecified: Secondary | ICD-10-CM

## 2012-06-26 DIAGNOSIS — R05 Cough: Secondary | ICD-10-CM

## 2012-06-26 DIAGNOSIS — Z0000 Encounter for general adult medical examination without abnormal findings: Secondary | ICD-10-CM

## 2012-06-26 LAB — POCT WET PREP (WET MOUNT): Clue Cells Wet Prep Whiff POC: NEGATIVE

## 2012-06-26 LAB — CBC WITH DIFFERENTIAL/PLATELET
Eosinophils Absolute: 0.1 10*3/uL (ref 0.0–0.7)
HCT: 38 % (ref 36.0–46.0)
Hemoglobin: 13 g/dL (ref 12.0–15.0)
Lymphs Abs: 2.5 10*3/uL (ref 0.7–4.0)
MCH: 32.6 pg (ref 26.0–34.0)
Monocytes Absolute: 0.6 10*3/uL (ref 0.1–1.0)
Monocytes Relative: 10 % (ref 3–12)
Neutro Abs: 2.7 10*3/uL (ref 1.7–7.7)
Neutrophils Relative %: 46 % (ref 43–77)
RBC: 3.99 MIL/uL (ref 3.87–5.11)

## 2012-06-26 LAB — BASIC METABOLIC PANEL
BUN: 14 mg/dL (ref 6–23)
CO2: 32 mEq/L (ref 19–32)
Glucose, Bld: 82 mg/dL (ref 70–99)
Potassium: 4.1 mEq/L (ref 3.5–5.3)
Sodium: 140 mEq/L (ref 135–145)

## 2012-06-26 NOTE — Progress Notes (Signed)
Subjective:     Patient ID: Linet Brash, female   DOB: 1981/02/24, 32 y.o.   MRN: [id number]  CC - CPE and pap smear  HPI  Ilisa Hayworth is a 32 y.o. female with h/o HTN and anxiety here for a well-woman exam, also with a cough.  1. Today requests well-woman exam and breast exam.  2. She is also concerned about symptoms she describes as starting 2 weeks ago when she was near the end of her period and developed fever to ~101F and then left-sided low-grade abdominal pain. Pain lasted 2 days, and then patient started to feel better. Then patient developed "chest cough" with sputum production that then evolved into a dry cough. She states her son had similar symptoms but without the fever. She reports nausea but no diarrhea or vomiting during this time. Denies current symptoms other than the dry cough.  She denies spotting. She does not use any birth control but had a home UPT around the time of symptom onset that was negative. She went to Urgent Care soon afterwards and had another negative UPT.  Review of Systems - Occasional nipple discharge. Otherwise, per HPI.  I have reviewed the patient's PMH, SH, and FH.  Past Medical History  Diagnosis Date  . Panic attack   . HTN (hypertension)   . Hyperemesis gravidarum   . Tachycardia   In addition, she had a mammogram and breast ultrasound 06/2011 for right breast palpable mass right inner quadrant, that showed dense breast tissue but no suspicious findings and recommended screening bilateral mammogram at 32 years old.     Objective:   Physical Exam BP 118/70  Temp(Src) 98.6 F (37 C) (Oral)  Ht 5\' 6"  (1.676 m)  Wt 134 lb 9.6 oz (61.054 kg)  BMI 21.74 kg/m2  LMP 06/11/2012 GEN: NAD, pleasant GU: Normal external genitalia, thin white vaginal discharge, no abnormal odor, cervix appears pink and in tact, bimanual exam with no masses or cervical motion tenderness Breast exam: No masses/lumps identified. Right nipple inverted, no  discharge expressed    Assessment:     Alisha Bacus is a 32 y.o. female with h/o HTN and anxiety here for a well-woman exam, also with a cough.    Plan:

## 2012-06-26 NOTE — Patient Instructions (Addendum)
It was good to see you today.  We did a pap smear and testing for STDs.  We also are checking your CBC and metabolic panel. I will call you if anything is abnormal. Otherwise, I will send you a letter.  You had a mammogram and ultrasound May 2013 that both were normal and recommended screening mammogram starting when you are 32 years old. If you develop any abnormalities sooner, it would be a good idea to ask if you need a mammogram sooner.

## 2012-06-26 NOTE — Assessment & Plan Note (Signed)
Well-controlled today. F/u BMET as one has not been done in some time

## 2012-06-26 NOTE — Assessment & Plan Note (Signed)
Patient came in today for pap smear. Performed the following on her request: Pap smear GC/Chlamydia Wet prep HIV/RPR --Call patient with results. --Screening mammogram when 32 years old (had mammogram and breast ultrasound 06/2011 that showed no abnormalities)

## 2012-06-26 NOTE — Assessment & Plan Note (Addendum)
Cough may last up to 6 weeks. Abdominal pain lasted 1-2 days and resolved; could be due to intra-abdominal infection (collected GC/Chlamydia, HIV, RPR, wet prep at beginning of visit when lab was about to close) vs pregnancy, but with no continued symptoms and recent negative urine pregnancy test x 2 reported.  -- Recommended patient repeat this either with a lab only visit here or a store-bought urine pregnancy test (due to lab closing while we were discussing this). -- Reviewed return precautions -- CBC with recent h/o fever

## 2012-06-27 LAB — HIV ANTIBODY (ROUTINE TESTING W REFLEX): HIV: NONREACTIVE

## 2012-06-28 ENCOUNTER — Encounter: Payer: Self-pay | Admitting: Family Medicine

## 2012-06-28 ENCOUNTER — Telehealth: Payer: Self-pay | Admitting: Family Medicine

## 2012-06-28 NOTE — Telephone Encounter (Signed)
Pt states that she is waiting for a call from her doctor - was told she would hear from her today

## 2012-06-28 NOTE — Telephone Encounter (Signed)
Called patient to discuss negative results, and pending GC/Chlamydia. She would like results mailed to her. Sent letter to blue team to mail to pt.

## 2012-07-02 ENCOUNTER — Encounter: Payer: Self-pay | Admitting: Family Medicine

## 2012-07-04 NOTE — Telephone Encounter (Signed)
Called and informed patient of remaining negative GC/Chlamydia and pap smear. Sent letter to patient as well.

## 2012-09-28 ENCOUNTER — Ambulatory Visit: Payer: BC Managed Care – PPO | Admitting: Family Medicine

## 2012-10-10 ENCOUNTER — Ambulatory Visit: Payer: BC Managed Care – PPO | Admitting: Internal Medicine

## 2012-11-10 ENCOUNTER — Other Ambulatory Visit: Payer: Self-pay | Admitting: Family Medicine

## 2012-12-17 ENCOUNTER — Ambulatory Visit: Payer: BC Managed Care – PPO

## 2013-01-01 ENCOUNTER — Ambulatory Visit: Payer: BC Managed Care – PPO

## 2013-01-01 ENCOUNTER — Ambulatory Visit (INDEPENDENT_AMBULATORY_CARE_PROVIDER_SITE_OTHER): Payer: 59 | Admitting: *Deleted

## 2013-01-01 DIAGNOSIS — Z23 Encounter for immunization: Secondary | ICD-10-CM

## 2013-01-04 ENCOUNTER — Telehealth: Payer: Self-pay | Admitting: Family Medicine

## 2013-01-04 NOTE — Telephone Encounter (Signed)
Pt called to let the team know that she is switching to a online pharmacy [facility]. [phone]. She said that [facility] will be faxing [place] forms to fill out. We need to put her member ID on them and in the future when doing refills. Her member ID [id number]. The only medication that she currently takes is hydrochlorothiazide. jw

## 2013-01-11 ENCOUNTER — Other Ambulatory Visit: Payer: Self-pay | Admitting: Family Medicine

## 2013-01-11 MED ORDER — HYDROCHLOROTHIAZIDE 12.5 MG PO CAPS: ORAL_CAPSULE | ORAL | Status: DC

## 2013-01-11 NOTE — Telephone Encounter (Signed)
Just refilled [facility] to [facility] and wrote member ID on it.  Leona Singleton, MD 01/11/2013 11:56 AM

## 2013-01-11 NOTE — Telephone Encounter (Signed)
Received fax from [facility] requesting 90 day supply of HCTZ. Approved with 3 refills. Will fax back.  Paige Singleton, MD 01/11/2013 11:36 AM

## 2013-03-15 ENCOUNTER — Encounter: Payer: Self-pay | Admitting: Cardiovascular Disease

## 2013-06-28 ENCOUNTER — Other Ambulatory Visit (HOSPITAL_COMMUNITY)
Admission: RE | Admit: 2013-06-28 | Discharge: 2013-06-28 | Disposition: A | Discharge status: Home / Self Care | Payer: 59 | Source: Ambulatory Visit | Attending: Family Medicine | Admitting: Family Medicine

## 2013-06-28 ENCOUNTER — Ambulatory Visit (INDEPENDENT_AMBULATORY_CARE_PROVIDER_SITE_OTHER): Payer: 59 | Admitting: Family Medicine

## 2013-06-28 ENCOUNTER — Encounter: Payer: Self-pay | Admitting: Family Medicine

## 2013-06-28 VITALS — BP 132/87 | HR 73 | Temp 98.5°F | Wt 132.0 lb

## 2013-06-28 DIAGNOSIS — I10 Essential (primary) hypertension: Secondary | ICD-10-CM | POA: Diagnosis not present

## 2013-06-28 DIAGNOSIS — Z113 Encounter for screening for infections with a predominantly sexual mode of transmission: Secondary | ICD-10-CM | POA: Insufficient documentation

## 2013-06-28 DIAGNOSIS — R5381 Other malaise: Secondary | ICD-10-CM | POA: Insufficient documentation

## 2013-06-28 DIAGNOSIS — F411 Generalized anxiety disorder: Secondary | ICD-10-CM | POA: Diagnosis not present

## 2013-06-28 DIAGNOSIS — Z124 Encounter for screening for malignant neoplasm of cervix: Secondary | ICD-10-CM

## 2013-06-28 DIAGNOSIS — R5383 Other fatigue: Secondary | ICD-10-CM

## 2013-06-28 DIAGNOSIS — Z0000 Encounter for general adult medical examination without abnormal findings: Secondary | ICD-10-CM

## 2013-06-28 DIAGNOSIS — Z309 Encounter for contraceptive management, unspecified: Secondary | ICD-10-CM

## 2013-06-28 DIAGNOSIS — F419 Anxiety disorder, unspecified: Secondary | ICD-10-CM

## 2013-06-28 LAB — CBC
HEMATOCRIT: 36.9 % (ref 36.0–46.0)
HEMOGLOBIN: 12.6 g/dL (ref 12.0–15.0)
MCH: 32.3 pg (ref 26.0–34.0)
MCHC: 34.1 g/dL (ref 30.0–36.0)
MCV: 94.6 fL (ref 78.0–100.0)
Platelets: 310 10*3/uL (ref 150–400)
RBC: 3.9 MIL/uL (ref 3.87–5.11)
RDW: 13.6 % (ref 11.5–15.5)
WBC: 4.9 10*3/uL (ref 4.0–10.5)

## 2013-06-28 LAB — POCT WET PREP (WET MOUNT): Clue Cells Wet Prep Whiff POC: NEGATIVE

## 2013-06-28 LAB — BASIC METABOLIC PANEL
BUN: 13 mg/dL (ref 6–23)
CHLORIDE: 103 meq/L (ref 96–112)
CO2: 32 meq/L (ref 19–32)
Calcium: 9.6 mg/dL (ref 8.4–10.5)
Creat: 0.79 mg/dL (ref 0.50–1.10)
Glucose, Bld: 93 mg/dL (ref 70–99)
POTASSIUM: 3.7 meq/L (ref 3.5–5.3)
Sodium: 140 mEq/L (ref 135–145)

## 2013-06-28 LAB — TSH: TSH: 1.226 u[IU]/mL (ref 0.350–4.500)

## 2013-06-28 NOTE — Assessment & Plan Note (Addendum)
Depression screen negative. Patient nonsmoker and does not use drugs. Breast exam requested and was normal. H/o benign mass removed reported per pt (unable to find result). - Pap smear, HIV, RPR, GC/Chlamydia, and wet prep done today. - Discussed home breast exams. - Nurse visit in October for flu shot. - Dentist q 6 months and brush teeth BID, floss and mouthwash daily.

## 2013-06-28 NOTE — Patient Instructions (Signed)
Good to see you.  We are getting labs today and I will call if any are NOT normal.  Take note of your side pain and if it becomes severe or changes in quality, come back to see me.  Come back in October for a flu shot.  Leona SingletonMaria T Verity Gilcrest, MD

## 2013-06-28 NOTE — Assessment & Plan Note (Signed)
Stable/well-controlled.  - BMET today - Continue HCTZ

## 2013-06-28 NOTE — Assessment & Plan Note (Signed)
DDx includes anemia, hypothyroidism, or electrolyte imbalance. - CBC, BMET, and TSH today.

## 2013-06-28 NOTE — Assessment & Plan Note (Signed)
Likely related to stressors in life at the time, but per patient well-controlled. - Monitor for recurrence.

## 2013-06-28 NOTE — Progress Notes (Signed)
Patient ID: Paige Neal, female   DOB: 1980/07/12, 33 y.o.   MRN: [id number] Subjective:   CC: Well woman visit  HPI:   Patient is here for well woman exam.  Health maintenance She would like pap smear, breast exam, and STD screening today.  She denies vaginal symptoms, discharge, or dyspareunia. She has sex with no protection with last intercourse being this weekend, though prior tot hat it was "a while." She does not think she is pregnant and defers Upreg.  Contraception management She does not use birth control or condoms. She has h/o migraines with aura so estrogen-containing OCPs are not an option and sprintec caused headaches so she stopped it after 1.5 mo of using it.  HTN She takes HCTZ daily and denies missing pills or having issues with medication. She also does not report chest pain, dyspnea, LE edema, or dizziness/fainting. She checks BP at home and it is usually 110s/70s.  Fatigue Patient has been feeling tired lately and would like some labs checked. We did not discuss this any further.  Anxiety  Well-controlled per patient since reducing stress in life (changed jobs, no longer has issues with child's father). Has not needed propranolol in many months. Previously had tachycardia, which is no longer an issue.   Review of Systems - Per HPI.   PMH: Medications: Reviewed. Not taking propranolol or sprintec.  SH: Smoking status: Nonsmoker. No drugs. Cannot remember last drink.    Objective:  Physical Exam BP 132/87  Pulse 73  Temp(Src) 98.5 F (36.9 C) (Oral)  Wt 132 lb (59.875 kg)  LMP 06/25/2013 GEN: NAD HEENT: Atraumatic, normocephalic, neck supple, EOMI, sclera clear  CV: RRR, no murmurs, rubs, or gallops PULM: CTAB, normal effort ABD: Soft, nontender, nondistended SKIN: No rash or cyanosis; warm and well-perfused EXTR: No lower extremity edema or calf tenderness PSYCH: Mood and affect euthymic, normal rate and volume of speech NEURO: Awake, alert,  no focal deficits grossly, normal speech GU: normal appearing vaginal mucosa and cervix with small amount of whitish discharge; no adnexal or cervical motion tenderness; normal size and mobility of uterus; no adnexal masses appreciated CHEST: Normal breast exam with no dimpling, skin changes, or nipple discharge. Right medial breast with 2cm well-healed smooth scar.    Assessment:     Paige Neal is a 33 y.o. female with h/o HTN and anxiety here for well woman exam.    Plan:     Health maintenance Depression screen negative. Patient nonsmoker and does not use drugs. Breast exam requested and was normal. H/o benign mass removed reported per pt (unable to find result). - Pap smear, HIV, RPR, GC/Chlamydia, and wet prep done today. - Discussed home breast exams. - Nurse visit in October for flu shot. - Dentist q 6 months and brush teeth BID, floss and mouthwash daily.  Contraceptive management Previous visit, patient was interested in the pill but estrogen-containing is contraindicated due to h/o migrane with aura and HTN. Decided against sprintec/micronor due to headaches. - Discussed thinking about birth control options and in the meantime, using condoms if pregnancy was not desired. At this time, she is not against it but not hoping for it either.  HTN Stable/well-controlled.  - BMET today - Continue HCTZ  Fatigue DDx includes anemia, hypothyroidism, or electrolyte imbalance. - CBC, BMET, and TSH today.  Anxiety  Likely related to stressors in life at the time, but per patient well-controlled. - Monitor for recurrence.  Follow-up: Follow up in October for flu shot.  Hilton Sinclair, MD [place]

## 2013-06-28 NOTE — Assessment & Plan Note (Signed)
Previous visit, patient was interested in the pill but estrogen-containing is contraindicated due to h/o migrane with aura and HTN. Decided against sprintec/micronor due to headaches. - Discussed thinking about birth control options and in the meantime, using condoms if pregnancy was not desired. At this time, she is not against it but not hoping for it either.

## 2013-06-29 LAB — RPR

## 2013-06-29 LAB — HIV ANTIBODY (ROUTINE TESTING W REFLEX): HIV: NONREACTIVE

## 2013-07-01 ENCOUNTER — Encounter: Payer: Self-pay | Admitting: Family Medicine

## 2013-07-02 LAB — CERVICOVAGINAL ANCILLARY ONLY
Chlamydia: NEGATIVE
Neisseria Gonorrhea: NEGATIVE

## 2013-07-24 ENCOUNTER — Telehealth: Payer: Self-pay | Admitting: Family Medicine

## 2013-07-24 NOTE — Telephone Encounter (Signed)
Results mailed to patient. Jazmin Hartsell,CMA

## 2013-07-24 NOTE — Telephone Encounter (Signed)
Pt called and would like a copy of her recent lab work mailed to her house. The address in Epic is correct . jw

## 2013-09-02 ENCOUNTER — Telehealth: Payer: Self-pay | Admitting: Family Medicine

## 2013-09-02 DIAGNOSIS — N63 Unspecified lump in unspecified breast: Secondary | ICD-10-CM

## 2013-09-02 NOTE — Telephone Encounter (Signed)
Pt called and will be faxing new insurance information over. She would like a [facility] to [facility] to have a mammogram. jw

## 2013-09-02 NOTE — Telephone Encounter (Signed)
I do not think she needs a referral but rather just the phone number for the breast center, right [facility]? Or because she is <40, does she need referral? I think this is for eval due to concern re: previous benign mass.  Paige SingletonMaria Neal [name], MD

## 2013-09-03 ENCOUNTER — Other Ambulatory Visit: Payer: Self-pay | Admitting: Family Medicine

## 2013-09-03 DIAGNOSIS — N63 Unspecified lump in unspecified breast: Secondary | ICD-10-CM

## 2013-09-03 NOTE — Telephone Encounter (Signed)
Pt has appt @ [facility] for 09/10/13.  She must have called after orders were placed. Fleeger, Maryjo RochesterJessica Dawn

## 2013-09-03 NOTE — Telephone Encounter (Signed)
Called to discuss with patient. She states that she found a small lump in her left breast over the weekend that feels very defined and "not like tissue." It is somewhat mobile. She denies pain, change over the past week, skin puckering, abnormal breast discharge, or skin changes. She has h/o benign breast mass excision in the right breast and had a diagnostic mammogram and US of right breast in 06/2011 due to a breast mass that was concerning. These were normal and recommended screening bilateral mammogram at age 33.  FH: Cousin with breast cancer, unsure of age at dx. No first degree relative with breast cancer.  PMH: Anxiety  I discussed with her that I would prefer to evaluate in clinic first, and that at her age it is most likely fibrocystic normal breast change, however malignancy is possible, though unlikely. She is amenable to eval in clinic, but states this has put her in a state of "panic" and she would like it evaluated by breast center soon. - Will place referral to breast center. BL[facility] TEAM: Please help arrange this appt and dx mammogram and US of breast.  - Also encouraged her to call and make an appt with our clinic for eval. - Pt voiced understanding.  Leona SingletonMaria T Rosario Kushner, MD

## 2013-09-03 NOTE — Telephone Encounter (Signed)
Correct, if she is less than 40 or having an issue (pain or lump) she will need a referral. Fleeger, Paige Neal

## 2013-09-10 ENCOUNTER — Ambulatory Visit
Admission: RE | Admit: 2013-09-10 | Discharge: 2013-09-10 | Disposition: A | Discharge status: Home / Self Care | Payer: BC Managed Care – PPO | Source: Ambulatory Visit | Attending: Family Medicine | Admitting: Family Medicine

## 2013-09-10 ENCOUNTER — Encounter (INDEPENDENT_AMBULATORY_CARE_PROVIDER_SITE_OTHER): Payer: Self-pay

## 2013-09-10 DIAGNOSIS — N63 Unspecified lump in unspecified breast: Secondary | ICD-10-CM

## 2013-09-11 ENCOUNTER — Encounter: Payer: Self-pay | Admitting: Family Medicine

## 2013-09-12 ENCOUNTER — Ambulatory Visit (INDEPENDENT_AMBULATORY_CARE_PROVIDER_SITE_OTHER): Payer: BC Managed Care – PPO | Admitting: Family Medicine

## 2013-09-12 ENCOUNTER — Ambulatory Visit: Payer: BC Managed Care – PPO | Admitting: Family Medicine

## 2013-09-12 ENCOUNTER — Encounter: Payer: Self-pay | Admitting: Family Medicine

## 2013-09-12 VITALS — BP 129/76 | HR 75 | Temp 98.3°F | Wt 124.0 lb

## 2013-09-12 DIAGNOSIS — I10 Essential (primary) hypertension: Secondary | ICD-10-CM

## 2013-09-12 DIAGNOSIS — R634 Abnormal weight loss: Secondary | ICD-10-CM

## 2013-09-12 DIAGNOSIS — N63 Unspecified lump in unspecified breast: Secondary | ICD-10-CM

## 2013-09-12 MED ORDER — HYDROCHLOROTHIAZIDE 12.5 MG PO CAPS: ORAL_CAPSULE | ORAL | Status: DC

## 2013-09-12 NOTE — Assessment & Plan Note (Signed)
Patient requested refill on hydrochlorothiazide today. Last creatinine and potassium normal in May. Blood pressure mildly elevated today at 129/76 which is just a little bit elevated for age. -Refilled hydrochlorothiazide. -Asked patient to check 3 values in the next week and call me. - refilled HCTZ, last cr and K normal. - Check 3 values in next week and call me.

## 2013-09-12 NOTE — Progress Notes (Signed)
Patient ID: Paige Neal, female   DOB: 07-20-1980, 33 y.o.   MRN: [id number] Subjective:   CC: Followup breast lump  HPI:   Follow up breast lump Patient is a 33 year old female with a history of right-sided breast mass that was excised and found to be benign. She called me about a left-sided breast mass that she was very concerned about due to her family history of a cousin with breast cancer. We had discussed at that time that I would prefer to see her in clinic first for evaluation, and that this is likely benign fibrocystic breast tissue, but she was extremely anxious and so I referred her to the breast Center for diagnostic mammogram and ultrasound due to her high level of concern and anxiety about this. 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 They recommended repeat ultrasound in 6 months but also went over options of ultrasound-guided fine-needle aspiration and excision. The patient is opting for excision but is discussing with the surgery team what this may cost her. On discussion today, she remains anxious about this but is glad that she got the ultrasound and mammogram.   Weight loss  She also endorses weight loss of around 8 pounds since her last visit that has been unintentional. She does note that she has been very busy for has not been needing like she should, but she gets 3 meals most days.  HTN Requests med refill today. Does not endorse chest pain or dyspnea. Takes HCTZ daily.  Review of Systems - Per HPI. She does not report any fevers, chills, or night sweats. She also denies any pain or any other concerns.  Past medical history: Benign breast mass excised from right breast Social history: Patient recently started a new job that is keeping her very busy, and she has a 13-year-old at home.    Objective:  Physical Exam BP 129/76  Pulse 75  Temp(Src) 98.3 F (36.8 C) (Oral)  Wt 124 lb (56.246 kg)  LMP 09/08/2013 GEN:  NAD Breast exam: Left breast with a around 1-2 cm mobile nontender rubbery mass palpated around 2-3 cm below the nipple at the 6:00 position. No skin changes, nipple discharge, or puckering. No axillary lymphadenopathy palpated. Right breast with small scar noted from prior excision, but no other abnormalities and no right axillary lymphadenopathy.    Assessment:     Paige Neal is a 33 y.o. female with h/o benign breast mass excision here for discussion of left breast mass.    Plan:     Left breast mass Left diagnostic mammogram and ultrasound showed a 1.5 cm mass that is probably benign. Recommendation is for repeat ultrasound in 6 months. However, patient is very anxious and prefers to have this excised as soon as possible. She is in the process of finding out the cost. -Patient to keep me in the loop regarding her decision. -Discussed with her in brief and in general terms the risks and benefits, and that it would probably be benign due to her age and previous benign breast mass. Did discuss the recommendation against excision at this time, but respecting her wishes. -Also discussed that this may recur in the future.  Weight loss Possibly related to decreased by mouth. did not fully evaluate today. Normal TSH in May. Unlikely malignancy due to age. Breast lump likely fibrocystic change but being evaluated currently. -Recommended carrying healthy snacks. -Recommended following up to discuss when convenient for patient.  Hypertension  Patient requested refill on hydrochlorothiazide today. Last creatinine and potassium normal in May. Blood pressure mildly elevated today at 129/76 which is just a little bit elevated for age. No chest pain or shortness of breath. -Refilled hydrochlorothiazide. -Asked patient to check 3 values in the next week and call me. - refilled HCTZ, last cr and K normal. - Check 3 values in next week and call me.     Paige SingletonMaria T Tenesha Garza, MD [facility]

## 2013-09-12 NOTE — Assessment & Plan Note (Addendum)
Possibly related to decreased by mouth. did not fully evaluate today. Normal TSH in May. Unlikely malignancy due to age. Breast lump likely fibrocystic change but being evaluated currently. -Recommended carrying healthy snacks. -Recommended following up to discuss when convenient for patient.

## 2013-09-12 NOTE — Assessment & Plan Note (Signed)
Left diagnostic mammogram and ultrasound showed a 1.5 cm mass that is probably benign. Recommendation is for repeat ultrasound in 6 months. However, patient is very anxious and prefers to have this excised as soon as possible. She is in the process of finding out the cost. -Patient to keep me in the loop regarding her decision. -Discussed with her in brief and in general terms the risks and benefits, and that it would probably be benign due to her age and previous benign breast mass. Did discuss the recommendation against excision at this time, but respecting her wishes. -Also discussed that this may recur in the future.

## 2013-10-03 ENCOUNTER — Ambulatory Visit (INDEPENDENT_AMBULATORY_CARE_PROVIDER_SITE_OTHER): Payer: BC Managed Care – PPO | Admitting: General Surgery

## 2013-10-03 ENCOUNTER — Encounter (INDEPENDENT_AMBULATORY_CARE_PROVIDER_SITE_OTHER): Payer: Self-pay | Admitting: General Surgery

## 2013-10-03 VITALS — BP 100/60 | HR 66 | Resp 16 | Ht 66.0 in | Wt 120.0 lb

## 2013-10-03 DIAGNOSIS — N63 Unspecified lump in unspecified breast: Secondary | ICD-10-CM

## 2013-10-03 DIAGNOSIS — N6320 Unspecified lump in the left breast, unspecified quadrant: Secondary | ICD-10-CM

## 2013-10-03 NOTE — Progress Notes (Signed)
Patient ID: Paige Neal, female   DOB: Nov 22, 1980, 33 y.o.   MRN: [id number]  No chief complaint on file.   HPI Paige Neal is a 33 y.o. female.  Referred by Dr Simone Curia HPI This is a 33 year old female who has a prior right breast fibroadenoma removed. She has a history since early July of a palpable left breast mass. There's been no change since then. She has no real symptoms associated with that mass. She has no nipple discharge. She does have a family history of breast cancer and onto that she does not know any more information about. She comes in today desiring discussion for excision of this mass.  Past Medical History  Diagnosis Date  . Panic attack   . HTN (hypertension)   . Hyperemesis gravidarum   . Tachycardia     Past Surgical History  Procedure Laterality Date  . Breast fibroadenoma surgery    . Gardisil x 3 - 08/30/2005      Family History  Problem Relation Age of Onset  . Coronary artery disease Mother   . Hypertension Mother   . Hypertension Father   . Hypertension Sister     Social History History  Substance Use Topics  . Smoking status: Never Smoker   . Smokeless tobacco: Not on file  . Alcohol Use: No    No Known Allergies  Current Outpatient Prescriptions  Medication Sig Dispense Refill  . hydrochlorothiazide (MICROZIDE) 12.5 MG capsule TAKE 1 CAPSULE BY MOUTH DAILY  90 capsule  3  . Multiple Vitamin (MULTIVITAMIN) tablet Take 1 tablet by mouth daily.      . norethindrone (ORTHO MICRONOR) 0.35 MG tablet Take 1 tablet (0.35 mg total) by mouth daily.  1 Package  11  . Prenatal Vit-Fe Fumarate-FA (PRENATAL 1+1 PO) 1 or 2 tabs po qd       . propranolol (INDERAL) 10 MG tablet Take 1 tablet (10 mg total) by mouth daily.  30 tablet  0   No current facility-administered medications for this visit.    Review of Systems Review of Systems  Constitutional: Positive for unexpected weight change. Negative for fever and chills.  HENT:  Negative for congestion, hearing loss, sore throat, trouble swallowing and voice change.   Eyes: Negative for visual disturbance.  Respiratory: Negative for cough and wheezing.   Cardiovascular: Positive for chest pain. Negative for palpitations and leg swelling.  Gastrointestinal: Negative for nausea, vomiting, abdominal pain, diarrhea, constipation, blood in stool, abdominal distention and anal bleeding.  Genitourinary: Negative for hematuria, vaginal bleeding and difficulty urinating.  Musculoskeletal: Negative for arthralgias.  Skin: Negative for rash and wound.  Neurological: Negative for seizures, syncope and headaches.  Hematological: Negative for adenopathy. Does not bruise/bleed easily.  Psychiatric/Behavioral: Negative for confusion.    Last menstrual period 09/08/2013.  Physical Exam Physical Exam  Vitals reviewed. Constitutional: She appears well-developed and well-nourished.  Neck: Neck supple.  Cardiovascular: Normal rate, regular rhythm and normal heart sounds.   Pulmonary/Chest: Effort normal and breath sounds normal. She has no wheezes. She has no rales. Right breast exhibits no inverted nipple, no mass, no nipple discharge, no skin change and no tenderness. Left breast exhibits mass. Left breast exhibits no inverted nipple, no nipple discharge, no skin change and no tenderness.    Lymphadenopathy:    She has no cervical adenopathy.    She has no axillary adenopathy.       Right: No supraclavicular adenopathy present.       Left:  No supraclavicular adenopathy present.    Data Reviewed EXAM:  DIGITAL DIAGNOSTIC BILATERAL MAMMOGRAM WITH 3D TOMOSYNTHESIS WITH  CAD  ULTRASOUND LEFT BREAST  COMPARISON: 07/05/2011.  ACR Breast Density Category d: The breast tissue is extremely dense,  which lowers the sensitivity of mammography.  FINDINGS:  The tomographic images of the left breast demonstrate an oval,  circumscribed mass in the 6 o'clock position at the location  of the  mass felt by the patient, marked with a metallic marker. No findings  elsewhere in either breast suspicious for malignancy.  Mammographic images were processed with CAD.  On physical exam, the patient has an approximately 1 cm oval,  smoothly marginated, mobile palpable mass in the 6 o'clock position  of the left breast, 2 cm from the nipple.  Ultrasound is performed, showing a 1.5 x 1.4 x 0.5 cm oval,  horizontally oriented, circumscribed, hypoechoic mass in the 6  o'clock position of the left breast, 2 cm from the nipple. This  contains a thin internal septation.  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  RECOMMENDATION:  Left breast ultrasound in 6 months. The options of ultrasound-guided  core needle biopsy and surgical excision were also discussed with  the patient but not recommended at this time. She is currently  comfortable with the 6 month followup ultrasound. However, she is  going to inquire about the cost of surgery in case she wants to  choose that route.    Assessment    Right breast mass consistent with fibroadenoma     Plan    Right breast mass excision  We discussed all the options including observation with serial ultrasound. I don't think it is mandatory that this needs to be removed. She very much desires excision. We discussed an excisional biopsy of this mass as well as the postoperative issues and risks associated with it.        Paige Neal 10/03/2013, 9:05 AM

## 2013-10-04 ENCOUNTER — Encounter: Payer: Self-pay | Admitting: Family Medicine

## 2013-10-04 LAB — CBC AND DIFFERENTIAL
HCT: 39 % (ref 36–46)
HEMOGLOBIN: 13.7 g/dL (ref 12.0–16.0)
Platelets: 283 10*3/uL (ref 150–399)
WBC: 3.8 10^3/mL

## 2013-10-04 LAB — BASIC METABOLIC PANEL
BUN: 16 mg/dL (ref 4–21)
Creatinine: 0.9 mg/dL (ref 0.5–1.1)
Glucose: 90 mg/dL
POTASSIUM: 3.8 mmol/L (ref 3.4–5.3)
Sodium: 139 mmol/L (ref 137–147)

## 2013-10-04 LAB — HEPATIC FUNCTION PANEL
ALT: 11 U/L (ref 7–35)
AST: 16 U/L (ref 13–35)
Alkaline Phosphatase: 56 U/L (ref 25–125)
Bilirubin, Total: 0.5 mg/dL

## 2013-10-04 LAB — LIPID PANEL
Cholesterol: 129 mg/dL (ref 0–200)
HDL: 60 mg/dL (ref 35–70)
LDL Cholesterol: 59 mg/dL
Triglycerides: 51 mg/dL (ref 40–160)

## 2013-10-04 LAB — TSH: TSH: 1.02 u[IU]/mL (ref 0.41–5.90)

## 2013-10-29 ENCOUNTER — Other Ambulatory Visit (HOSPITAL_COMMUNITY)
Admission: RE | Admit: 2013-10-29 | Discharge: 2013-10-29 | Disposition: A | Discharge status: Home / Self Care | Payer: BC Managed Care – PPO | Source: Ambulatory Visit | Attending: Family Medicine | Admitting: Family Medicine

## 2013-10-29 ENCOUNTER — Encounter: Payer: Self-pay | Admitting: Family Medicine

## 2013-10-29 ENCOUNTER — Telehealth: Payer: Self-pay | Admitting: Family Medicine

## 2013-10-29 ENCOUNTER — Ambulatory Visit (INDEPENDENT_AMBULATORY_CARE_PROVIDER_SITE_OTHER): Payer: BC Managed Care – PPO | Admitting: Family Medicine

## 2013-10-29 VITALS — BP 121/78 | HR 76 | Temp 98.3°F | Ht 66.0 in | Wt 121.0 lb

## 2013-10-29 DIAGNOSIS — Z113 Encounter for screening for infections with a predominantly sexual mode of transmission: Secondary | ICD-10-CM | POA: Insufficient documentation

## 2013-10-29 DIAGNOSIS — N898 Other specified noninflammatory disorders of vagina: Secondary | ICD-10-CM | POA: Insufficient documentation

## 2013-10-29 DIAGNOSIS — Z124 Encounter for screening for malignant neoplasm of cervix: Secondary | ICD-10-CM | POA: Diagnosis not present

## 2013-10-29 DIAGNOSIS — Z1151 Encounter for screening for human papillomavirus (HPV): Secondary | ICD-10-CM | POA: Insufficient documentation

## 2013-10-29 DIAGNOSIS — R634 Abnormal weight loss: Secondary | ICD-10-CM

## 2013-10-29 DIAGNOSIS — Z01419 Encounter for gynecological examination (general) (routine) without abnormal findings: Secondary | ICD-10-CM | POA: Diagnosis not present

## 2013-10-29 HISTORY — DX: Other specified noninflammatory disorders of vagina: N89.8

## 2013-10-29 LAB — POCT WET PREP (WET MOUNT)
Clue Cells Wet Prep Whiff POC: NEGATIVE
WBC, Wet Prep HPF POC: >20

## 2013-10-29 MED ORDER — PROPRANOLOL HCL 10 MG PO TABS: 10.0000 mg | ORAL_TABLET | Freq: Every day | ORAL | Status: DC

## 2013-10-29 MED ORDER — METRONIDAZOLE 500 MG PO TABS: 500.0000 mg | ORAL_TABLET | Freq: Two times a day (BID) | ORAL | Status: AC

## 2013-10-29 NOTE — Assessment & Plan Note (Addendum)
"  Tingling" sensation. No back pain, No fevers/chills or cervical motion tenderness. No abdominal pain. Recent change in pantyliner. Not perfectly related to time course of symptoms. - Change back to unscented products. - Cotton underwear, change twice daily. - Wet prep/Gc/Chlamydia today. - Return precautions reviewed. - Discussed avoiding tight undergarments/pants.

## 2013-10-29 NOTE — Assessment & Plan Note (Signed)
Likely due to reduced quantity of each meal with stress and busy days. No first degree FH of cancer. No fevers/chills. Mild subacute cough likely postviral. No bloody sputum reported. Normal labwork done at pt's workplace (TSH, CBC, CMET) 10/04/13. Pt thinks her baseline is 132 lbs. HIV/RPR 06/2013 negative. - Repeat pap smear today (last 05/2012 was normal). - Discussed eating three healthy meals and 2 healthy snacks daily. - F/u in 2 months if not improving. - TB skin test +/- CXR on f/u if cough and weight loss still present. - Mass she palpated in breast likely benign fibrocystic tissue but pt is getting this removed.

## 2013-10-29 NOTE — Patient Instructions (Signed)
Your weight loss is most likely due to not eating three meals daily. Most healthy adults need three meals and 2 healthy snacks daily.  Be sure you are making meals a regular part of your day. Follow up with me if weight does not improve. For now, know that your thyroid, basic metabolic panel, and blood counts are normal. We are also doing  A pap smear today. Follow up with me in 2 months if no better.  For your discharge, we got labs and I will call if any are NOT normal. In th e meantime, wear cotton underwear, change twice daily while it is hot outside, and do not douche. Avoid scented pantyliners.  Seek immediate care if you start having fevers, chills, or other concerns.  Best,  Leona Singleton, MD

## 2013-10-29 NOTE — Progress Notes (Signed)
Patient ID: Paige Neal, female   DOB: 09-20-1980, 33 y.o.   MRN: [id number] Subjective:   CC: Vaginal discharge, weight loss  HPI:   Vaginal discharge Paige Neal is a 33 y.o. female here with vaginal sensation that "feels funny," like a tingly sensation with no itching or pain. She has some discharge that seems normal to her. Symptoms present 1.5 months on and off and she is uncertain if they are related to wearing scented pantyliners which she does not usually do. She denies fevers, chills, abdominal pain, or concern for STD as she has not been sexually active in months. She wears cotton underwaear and changes pantyliners daily.   Weight loss Tekelia thinks she has decreased about 11 lbs from her baseline of 132 lbs. She denies fevers, chills, or other complaints. She does not report any bloody stools or dyspnea. She had labwork at work that was normal (CMET, TSH, CBC). Last visit, we thought her weight loss may be related to her not eating well due to being very busy and stressed with work and home life, eating three meals daily but small portions.  Review of Systems - Per HPI. She has also had persistent cough since a URI a few weeks ago. Cough is improving. Does not report bloody sputum.  FH: No first degree relatives with cancer Cousin had breast cancer, uncertain age or which side of family or type of breast cancer. PMH: Pap normal 05/2012. Thinks one abnormal years ago. Smoking status: Nonsmoker Denies drugs Minimal alcohol    Objective:  Physical Exam BP 121/78  Pulse 76  Temp(Src) 98.3 F (36.8 C) (Oral)  Ht  (1.676 m)  Wt 121 lb (54.885 kg)  BMI 19.54 kg/m2 GEN: NAD, thin GU: Normal vaginal mucosa with no lesions/ulcerations; mild white vaginal discharge with normal appearing cervix; no CMT; normal uterine and adnexal size and mobility with no masses CV: RRR by bilateral radial pulse PULM: Normal effort ABD: S/NT/ND EXTR: No LE edema PSYCH: Mood and  affect euthymic/mildly anxious    Assessment:     Paige Neal is a 33 y.o. female here for vaginal discharge and weight loss eval.    Plan:     Vaginal discharge "Tingling" sensation. No back pain, No fevers/chills or cervical motion tenderness. No abdominal pain. Recent change in pantyliner. Not perfectly related to time course of symptoms. - Change back to unscented products. - Cotton underwear, change twice daily. - Wet prep/Gc/Chlamydia today. - Return precautions reviewed. - Discussed avoiding tight undergarments/pants.   Weight loss Likely due to reduced quantity of each meal with stress and busy days. No first degree FH of cancer. No fevers/chills. Mild subacute cough likely postviral. No bloody sputum reported. Normal labwork done at pt's workplace (TSH, CBC, CMET) 10/04/13. Pt thinks her baseline is 132 lbs. HIV/RPR 06/2013 negative. - Repeat pap smear today (last 05/2012 was normal). - Discussed eating three healthy meals and 2 healthy snacks daily. - F/u in 2 months if not improving. - TB skin test +/- CXR on f/u if cough and weight loss still present. - Mass she palpated in breast likely benign fibrocystic tissue but pt is getting this removed.  # Health Maintenance: Pap smear today. - Refilled propranolol per pt request.  Follow-up: Follow up in 2 months for if weight not improving.   Paige Singleton, MD [facility]

## 2013-10-29 NOTE — Telephone Encounter (Signed)
Called and discussed wet prep positive for BV and prescribed flagyl 7 day course. Discussed to not drink alcohol with this. Pt voiced understanding.  Leona Singleton, MD

## 2013-11-01 ENCOUNTER — Encounter: Payer: Self-pay | Admitting: *Deleted

## 2013-11-01 ENCOUNTER — Telehealth: Payer: Self-pay | Admitting: *Deleted

## 2013-11-01 ENCOUNTER — Encounter: Payer: Self-pay | Admitting: Family Medicine

## 2013-11-01 LAB — CYTOLOGY - PAP

## 2013-11-01 NOTE — Telephone Encounter (Signed)
Letter mailed to patient with negative results. Flynn Lininger,CMA  

## 2013-11-01 NOTE — Telephone Encounter (Signed)
Message copied by Henri Medal on Fri Nov 01, 2013  8:30 AM ------      Message from: Birdie Sons, ERIC G      Created: Thu Oct 31, 2013  6:17 PM       Please inform the patient her GC/chlamydia was negative. Thanks. ------

## 2013-11-25 ENCOUNTER — Ambulatory Visit (INDEPENDENT_AMBULATORY_CARE_PROVIDER_SITE_OTHER): Payer: BC Managed Care – PPO | Admitting: *Deleted

## 2013-11-25 DIAGNOSIS — Z23 Encounter for immunization: Secondary | ICD-10-CM

## 2013-12-13 ENCOUNTER — Other Ambulatory Visit (INDEPENDENT_AMBULATORY_CARE_PROVIDER_SITE_OTHER): Payer: Self-pay | Admitting: General Surgery

## 2013-12-13 LAB — CBC WITH DIFFERENTIAL/PLATELET
Basophils Absolute: 0 10*3/uL (ref 0.0–0.1)
Basophils Relative: 1 % (ref 0–1)
EOS PCT: 2 % (ref 0–5)
Eosinophils Absolute: 0.1 10*3/uL (ref 0.0–0.7)
HEMATOCRIT: 41.3 % (ref 36.0–46.0)
Hemoglobin: 14.2 g/dL (ref 12.0–15.0)
LYMPHS ABS: 2 10*3/uL (ref 0.7–4.0)
Lymphocytes Relative: 47 % — ABNORMAL HIGH (ref 12–46)
MCH: 32.7 pg (ref 26.0–34.0)
MCHC: 34.4 g/dL (ref 30.0–36.0)
MCV: 95.2 fL (ref 78.0–100.0)
Monocytes Absolute: 0.6 10*3/uL (ref 0.1–1.0)
Monocytes Relative: 14 % — ABNORMAL HIGH (ref 3–12)
Neutro Abs: 1.5 10*3/uL — ABNORMAL LOW (ref 1.7–7.7)
Neutrophils Relative %: 36 % — ABNORMAL LOW (ref 43–77)
PLATELETS: 313 10*3/uL (ref 150–400)
RBC: 4.34 MIL/uL (ref 3.87–5.11)
RDW: 13.8 % (ref 11.5–15.5)
WBC: 4.2 10*3/uL (ref 4.0–10.5)

## 2013-12-13 LAB — BASIC METABOLIC PANEL
BUN: 15 mg/dL (ref 6–23)
CO2: 23 meq/L (ref 19–32)
CREATININE: 0.85 mg/dL (ref 0.50–1.10)
Calcium: 10 mg/dL (ref 8.4–10.5)
Chloride: 102 mEq/L (ref 96–112)
GLUCOSE: 83 mg/dL (ref 70–99)
Potassium: 4.1 mEq/L (ref 3.5–5.3)
Sodium: 138 mEq/L (ref 135–145)

## 2013-12-19 ENCOUNTER — Other Ambulatory Visit (INDEPENDENT_AMBULATORY_CARE_PROVIDER_SITE_OTHER): Payer: Self-pay | Admitting: General Surgery

## 2014-02-04 ENCOUNTER — Other Ambulatory Visit: Payer: Self-pay | Admitting: Family Medicine

## 2014-02-04 DIAGNOSIS — N6320 Unspecified lump in the left breast, unspecified quadrant: Secondary | ICD-10-CM

## 2014-03-10 ENCOUNTER — Other Ambulatory Visit: Payer: Self-pay | Admitting: Family Medicine

## 2014-03-10 DIAGNOSIS — N6320 Unspecified lump in the left breast, unspecified quadrant: Secondary | ICD-10-CM

## 2014-03-13 ENCOUNTER — Ambulatory Visit
Admission: RE | Admit: 2014-03-13 | Discharge: 2014-03-13 | Disposition: A | Discharge status: Home / Self Care | Payer: BLUE CROSS/BLUE SHIELD | Source: Ambulatory Visit | Attending: Family Medicine | Admitting: Family Medicine

## 2014-03-13 DIAGNOSIS — N6320 Unspecified lump in the left breast, unspecified quadrant: Secondary | ICD-10-CM

## 2014-03-31 ENCOUNTER — Telehealth: Payer: Self-pay | Admitting: Family Medicine

## 2014-03-31 NOTE — Telephone Encounter (Signed)
Had a question about my period

## 2014-03-31 NOTE — Telephone Encounter (Signed)
Pt called again stating she started bleeding a little bit, per pt is was "clumpy".  Pt is not on any birth control and last sex for 03/29/14.  Denies any pain.  Will forward to PCP for further advise.  Clovis PuMartin, Tamika L, RN

## 2014-04-01 NOTE — Telephone Encounter (Addendum)
Called to discuss with patient. She stated she had 1 episode of "clumpy blood" seen in the toilet after urinating that she thought maybe was her period starting early. Since then she has had brownish discharge. LMP was 03/15/14. She does not use any form of birth control and was last sexually active 1/31. Later that night she had BM that looked like whitish "worms" mixed into the stool. She has not had fevers or recent travel or drinking from streams. Has had mild pelvic pain feeling like menstrual cramps, last week, lasting a few seconds before spontaneous resolution. Mild nausea with no vomiting. Chronic gassiness and stomach pain after eating, which was improved 1 month ago with tums but is now returning. Otherwise has felt well.  Discussed that this is most likely nothing emergent but could be intermenstrual bleeding. Discussed need for clinic eval for exam and testing including UPT. Unclear etiology of stool symptoms but no recent travel or drinking from streams to raise concern for parasite with one episode of odd-appearing stool. Finally, abdominal pain with food could be related to GERD. - Discussed she could try OTC pepto bismol or tums and to avoid caffeine, alcohol, and sodas. - She has already made appt Monday to be seen. - Discussed immediate medical attention if she has severe pain, emesis / hematemesis, hematochezia, or other new concerns.   Leona SingletonMaria T Nasim Garofano, MD

## 2014-04-14 ENCOUNTER — Ambulatory Visit: Payer: BLUE CROSS/BLUE SHIELD | Admitting: Family Medicine

## 2014-04-17 ENCOUNTER — Ambulatory Visit (INDEPENDENT_AMBULATORY_CARE_PROVIDER_SITE_OTHER): Payer: BLUE CROSS/BLUE SHIELD | Admitting: Family Medicine

## 2014-04-17 ENCOUNTER — Encounter: Payer: Self-pay | Admitting: Family Medicine

## 2014-04-17 VITALS — BP 132/86 | HR 69 | Temp 98.6°F | Wt 124.0 lb

## 2014-04-17 DIAGNOSIS — K219 Gastro-esophageal reflux disease without esophagitis: Secondary | ICD-10-CM | POA: Diagnosis not present

## 2014-04-17 DIAGNOSIS — R1084 Generalized abdominal pain: Secondary | ICD-10-CM | POA: Diagnosis not present

## 2014-04-17 DIAGNOSIS — R1013 Epigastric pain: Secondary | ICD-10-CM | POA: Diagnosis not present

## 2014-04-17 DIAGNOSIS — R194 Change in bowel habit: Secondary | ICD-10-CM

## 2014-04-17 DIAGNOSIS — N898 Other specified noninflammatory disorders of vagina: Secondary | ICD-10-CM

## 2014-04-17 DIAGNOSIS — N939 Abnormal uterine and vaginal bleeding, unspecified: Secondary | ICD-10-CM

## 2014-04-17 DIAGNOSIS — R198 Other specified symptoms and signs involving the digestive system and abdomen: Secondary | ICD-10-CM

## 2014-04-17 DIAGNOSIS — N9489 Other specified conditions associated with female genital organs and menstrual cycle: Secondary | ICD-10-CM | POA: Diagnosis not present

## 2014-04-17 DIAGNOSIS — I878 Other specified disorders of veins: Secondary | ICD-10-CM

## 2014-04-17 MED ORDER — OMEPRAZOLE 40 MG PO CPDR: 40.0000 mg | DELAYED_RELEASE_CAPSULE | Freq: Every day | ORAL | Status: DC

## 2014-04-17 MED ORDER — METRONIDAZOLE 500 MG PO TABS: 500.0000 mg | ORAL_TABLET | Freq: Two times a day (BID) | ORAL | Status: DC

## 2014-04-17 NOTE — Patient Instructions (Signed)
Good to see you.  This sounds like acid reflux. - Take omeprazole daily for 14 days, then follow up to see how you are doing. - Avoid eating late, caffeine, nicotine, carbonated drinks, chocolate, tobacco smoke, and alcohol. - Journal when you have pain and what trigger may have caused it. - Try GasX (simethicone) for gas related pain. - Seek immediate care if you have vomiting, bloody vomiting, or other concerns.  For what sounds like bacterial vaginosis, take flagyl twice daily for 1 week and avoid alcohol while taking. Follow up if not imrpoved.  We will check kidney function today.  Return if the bruising on your leg occurs again so we can see it. It is likely from spider veins which are not dangerous.  Best,  Leona SingletonMaria T Zlata Alcaide, MD

## 2014-04-17 NOTE — Progress Notes (Signed)
Patient ID: Paige Neal, female   DOB: [birth year], 34 y.o.   MRN: [id number] Subjective:   CC: F/u abdominal pain, vaginal bleeding, change in stool  HPI:   Patient presents for f/u of abdominal pain, vaginal bleeding, stool change, but also has a few other concerns to discuss.  Abdominal pain Pt has chronic nagging dull lower abdominal pain 2 months of epigastric pain as well into chest. Epigastric pain worse x 2 months with increased gas and belching. Symptoms worse 2 days ago but she is unsure of trigger. Not sure if related to food. Tums helps symptoms. No diarrhea or vomiting but mild nausea (severe 2 days ago with sour taste in mouth). No bloody stool. Does not drink much caffeine, no alcohol, cigarettes, marijuana use, sodas, or NSAIDs. Has OJ for breakfast and water throughout remainder of day. Possibly stressed (reports "a lot going on"). Has dinner 630pm.  Change in BM Phone cal 2/1 where patient had stool that had linear white objects in it that she described looked like "worms." No recent travel or sick contacts, no fever, no more episodes since this one episode. Had sharp abdominal pain for 1-2 minutes 1 week prior but none since. No severe or persistent abdominal pain. She googled this and thought she may have taken too much vitamin C or have a kidney issue. Cr reviewed 11/2013 and normal.  Vaginal bleeding Occurred once, described in 2/1 phone note. None since other than regular period which ended 4 days ago. Consistency was a little thinner and decreased deepness in color per patient. No vaginal or pelvic pain or further bleeding.  Vaginal discharge A few days, malodorous, no pain, itching, fever, or other complaint. Nagging low abdominal pain is chronic. Pt not concerned for STD.  Prominent veins and spots left thigh Patient describes prominent veins left anterior thigh and a few small bruises on anterior left thigh. This occurred once before on right thigh. None present  currently. She is concerned about this. Mother has similar prominent thin veins on legs.  Review of Systems - Per HPI.   PMH: Reviewed Smoking status: Never smoker    Objective:  Physical Exam BP 132/86 mmHg  Pulse 69  Temp(Src) 98.6 F (37 C) (Oral)  Wt 124 lb (56.246 kg)  LMP 04/07/2014 (Approximate) GEN: NADNAD ABD: S/NT/ND, thin EXTR: No LE edema, left anterior thigh with 2 areas with spider veins, no bruising, no tenderness, no asymmetriy PULM: Normal effort      Assessment:     Paige Neal is a 34 y.o. female  here for f/u of multiple issues    Plan:     # See problem list and after visit summary for problem-specific plans.   # Health Maintenance: Not discussed  Follow-up: Follow up in 1-2 weeks PRN lack of improvement of vaginal discharge and 2-4 weeks if no improvement in abdominal pain.   Leona SingletonMaria T Lashuna Tamashiro, MD [facility]

## 2014-04-18 LAB — COMPREHENSIVE METABOLIC PANEL
ALK PHOS: 56 U/L (ref 39–117)
ALT: 14 U/L (ref 0–35)
AST: 15 U/L (ref 0–37)
Albumin: 4.5 g/dL (ref 3.5–5.2)
BILIRUBIN TOTAL: 0.4 mg/dL (ref 0.2–1.2)
BUN: 15 mg/dL (ref 6–23)
CO2: 32 meq/L (ref 19–32)
Calcium: 10.5 mg/dL (ref 8.4–10.5)
Chloride: 102 mEq/L (ref 96–112)
Creat: 0.75 mg/dL (ref 0.50–1.10)
Glucose, Bld: 88 mg/dL (ref 70–99)
Potassium: 3.9 mEq/L (ref 3.5–5.3)
SODIUM: 142 meq/L (ref 135–145)
TOTAL PROTEIN: 7.2 g/dL (ref 6.0–8.3)

## 2014-04-19 DIAGNOSIS — R109 Unspecified abdominal pain: Secondary | ICD-10-CM | POA: Insufficient documentation

## 2014-04-19 DIAGNOSIS — N939 Abnormal uterine and vaginal bleeding, unspecified: Secondary | ICD-10-CM | POA: Insufficient documentation

## 2014-04-19 DIAGNOSIS — R198 Other specified symptoms and signs involving the digestive system and abdomen: Secondary | ICD-10-CM | POA: Insufficient documentation

## 2014-04-19 DIAGNOSIS — I878 Other specified disorders of veins: Secondary | ICD-10-CM | POA: Insufficient documentation

## 2014-04-19 NOTE — Assessment & Plan Note (Signed)
Likely BV with malodorous discharge with no other symptom and afebrile. This concern came up at end of visit after we had covered other issues, and with no concerning symptoms, did not perform wet prep. - Flagyl BID x 7 days, avoid alcohol, and f/u if not improving in 1 week or if new symptoms for reevaluation and testing.

## 2014-04-19 NOTE — Assessment & Plan Note (Signed)
Likely GERD with subacute epigastric pain, belching, sour taste, occasional nausea, and risk factor of anxiety at baseline with reported increase in stress. No major dietary causes identified. Unclear etiology for lower abdominal chronic pain.  - Asked pt to keep journal of possible triggers. - Work on stress relief with 10-30 minutes for yourself every day/walking. Discussed other things to avoid. - Omeprazole 40mg  daily x 2 weeks. Concha Pyo- GasX if severe gas symptoms. - F/u 2-4 weeks if not improving.

## 2014-04-19 NOTE — Assessment & Plan Note (Signed)
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 No pain and no bruising now but spider veins visible and mother has similar hx. PLTs normal recent check. - Monitor, return if bruising occurs so we can examine.

## 2014-04-19 NOTE — Assessment & Plan Note (Signed)
Likely intermentsrual spotting, pt declines UPT today. No further bleeding after regular period. - Monitor for now.

## 2014-04-19 NOTE — Assessment & Plan Note (Signed)
BM change unlikely to be due to infection given no abdominal pain since, single isolated episode, no fevers or chills, no nausea or vomiting, or other concerns.  - CMET today - Can hold off on vit c unless feels a cold coming on. Discussed that there is inconclusive evidence about benefits of vitamin supplements anyway.

## 2014-04-20 ENCOUNTER — Encounter: Payer: Self-pay | Admitting: Family Medicine

## 2014-06-11 ENCOUNTER — Other Ambulatory Visit: Payer: Self-pay | Admitting: Family Medicine

## 2014-06-16 ENCOUNTER — Telehealth: Payer: Self-pay | Admitting: Family Medicine

## 2014-06-16 NOTE — Telephone Encounter (Signed)
Will forward to triage nurse. Jazmin Hartsell,CMA  

## 2014-06-16 NOTE — Telephone Encounter (Signed)
Wants to know what is safe to take for her allergies, her concern is that she also has high blood pressure. Patient asked to speak to triage nurse. Please advise / Dorothey BasemanSadie Reynolds, ASA

## 2014-06-16 NOTE — Telephone Encounter (Signed)
Pt advised to try Flonase, Loratadine or Cetirizine per Dr. Benjamin Stainhekkekandam.  If medications listed does not help; pt to call for an appt.  Pt stated understanding.  Clovis PuMartin, Jameson Morrow L, RN

## 2014-06-23 ENCOUNTER — Encounter: Payer: Self-pay | Admitting: Family Medicine

## 2014-07-16 ENCOUNTER — Encounter: Payer: Self-pay | Admitting: Family Medicine

## 2014-07-16 ENCOUNTER — Ambulatory Visit (INDEPENDENT_AMBULATORY_CARE_PROVIDER_SITE_OTHER): Payer: BLUE CROSS/BLUE SHIELD | Admitting: Family Medicine

## 2014-07-16 VITALS — BP 111/71 | HR 81 | Temp 98.2°F | Ht 66.0 in | Wt 122.0 lb

## 2014-07-16 DIAGNOSIS — R634 Abnormal weight loss: Secondary | ICD-10-CM

## 2014-07-16 DIAGNOSIS — R1010 Upper abdominal pain, unspecified: Secondary | ICD-10-CM | POA: Diagnosis not present

## 2014-07-16 LAB — TSH: TSH: 1.291 u[IU]/mL (ref 0.350–4.500)

## 2014-07-16 LAB — GLUCOSE, CAPILLARY: Glucose-Capillary: 80 mg/dL (ref 65–99)

## 2014-07-16 MED ORDER — OMEPRAZOLE 20 MG PO CPDR: 20.0000 mg | DELAYED_RELEASE_CAPSULE | Freq: Every day | ORAL | Status: DC

## 2014-07-16 NOTE — Progress Notes (Signed)
Patient ID: Arlyss RepressChristina Burchell, female   DOB: Mar 13, 1980, 34 y.o.   MRN: [id number] Subjective:   CC: F/u abdominal pain  HPI:   Follow-up abdominal pain Patient took omeprazole for 2-3 weeks with improvement in symptoms but once she stopped taking this, symptoms returned. She reports left and occasionally right upper quadrant mild dull abdominal pain that resolves once she belches. She does not feel increased gassiness. She thinks that cinnamon toast crunch and sodas are a trigger for her pain. She denies nausea, vomiting, diarrhea, fevers, chills, hematochezia, bitter taste in her throat, early morning symptoms or cough. She has stopped drinking daily orange juice and is trying to drink more water, but still only drinks about 32 ounces a day of water. She denies any NSAID use, drinks soda minimally, and denies alcohol or tobacco use. She has dinner at 6:30 PM and occasionally snacks no later than 2 hours before bed. She has a normal bowel movement daily with no straining.  Pt concern for diabetes Patient is also concerned about her risk for diabetes. She states that she looked it up on Google and found that she has many of the symptoms that she read about, but is unable to remember these at this time. She states she has been more thirsty but is only drinking about two 16 ounce bottles water a day. She is not having polyuria. And her urine is often a medium color of yellow. She denies blurred vision or headaches, though occasionally feels a little bit dizzy. She eats 3 meals per day. She denies chest pain or shortness of breath. She also has a history of inability to maintain her weight per patient.  Review of Systems - Per HPI. Additionally,  - she previously noticed a white "blob" when she urinated but this resolved after she stopped drinking orange juice and apple cider vinegar.  - generalized mild itching since the beginning of this year, especially when she takes a shower.  - left foot gets a  little bit numb if she is seated for too long, but has no other neurologic complaints.  PMH - vaginal discharge, vaginal bleeding, prominent vein, weight loss, hypertension, anxiety, abdominal pain, breast lump SH: Finds work to be a source of anxiety    Objective:  Physical Exam BP 111/71 mmHg  Pulse 81  Temp(Src) 98.2 F (36.8 C) (Oral)  Ht 5\' 6"  (1.676 m)  Wt 122 lb (55.339 kg)  BMI 19.70 kg/m2  LMP 06/26/2014 (Approximate) GEN: NAD CV: Regular rate and rhythm, no murmurs rubs or gallops, 2+ bilateral dorsalis pedis pulses Pulmonary: Clear to auscultation bilaterally, normal effort Abdomen: Soft, nontender, nondistended, normal abdominal bowel sounds, no suprapubic tenderness Extremities: No LE edema or calf tenderness, full range of motion and spontaneous movement of all extremities Skin: No Acanthus nigricans HEENT: Atraumatic, normocephalic, sclera clear, extra ocular movements intact, neck supple, no thyromegaly palpated Neuro: Awake, alert, no focal deficits with normal gait and spontaneous normal movement of all extremities, normal speech and gait    Assessment:     Arlyss RepressChristina Burchell is a 34 y.o. female here for f/u of abdominal pain and with other complaints.    Plan:     # See problem list and after visit summary for problem-specific plans. - Multiple complaints, related in part to anxiety, unable to address all but did address Left foot numbness when seated - normal exam findings, 2+ bilateral dorsalis pedis pulses with normal bloodflow; most likely related to wearing tight clothing and being seated  for a long time with some nerve compression, benign with no neurologic findings on exam.  -Reassured and discussed wearing looser underwear and pants to see if this helps, standing occasionally as well.   Follow-up: Follow up in 1 month if abdominal pain not improving, or immediately if worsening. F/u in 2-3 weeks to discuss anxiety.   Leona SingletonMaria T Daryel Kenneth, MD [facility]

## 2014-07-16 NOTE — Patient Instructions (Signed)
Your exam is normal today. It is very unlikely that you have diabetes. -We will check a blood sugar today. -With your concern for weight we will also check thyroid function.  Your abdominal pain is most likely reflux. -Restart omeprazole 20 mg every evening before bedtime on an empty stomach. -Take this for 3 months. -Decreased acidic food or drink intake including citrus fruits and juices. -Minimize soda intake. -Avoid any anti-inflammatory medications for now. -Try elevating the head of your bed to 20. -Increase water intake as you are likely not drinking enough at this time. -Follow-up in one month if symptoms have not significantly improved with these changes. -Be sure to get regular exercise to help keep anxiety to a minimum and follow-up to discuss this as well.  Gastroesophageal Reflux Disease, Adult Gastroesophageal reflux disease (GERD) happens when acid from your stomach flows up into the esophagus. When acid comes in contact with the esophagus, the acid causes soreness (inflammation) in the esophagus. Over time, GERD may create small holes (ulcers) in the lining of the esophagus. CAUSES   Increased body weight. This puts pressure on the stomach, making acid rise from the stomach into the esophagus.  Smoking. This increases acid production in the stomach.  Drinking alcohol. This causes decreased pressure in the lower esophageal sphincter (valve or ring of muscle between the esophagus and stomach), allowing acid from the stomach into the esophagus.  Late evening meals and a full stomach. This increases pressure and acid production in the stomach.  A malformed lower esophageal sphincter. Sometimes, no cause is found. SYMPTOMS   Burning pain in the lower part of the mid-chest behind the breastbone and in the mid-stomach area. This may occur twice a week or more often.  Trouble swallowing.  Sore throat.  Dry cough.  Asthma-like symptoms including chest tightness,  shortness of breath, or wheezing. DIAGNOSIS  Your caregiver may be able to diagnose GERD based on your symptoms. In some cases, X-rays and other tests may be done to check for complications or to check the condition of your stomach and esophagus. TREATMENT  Your caregiver may recommend over-the-counter or prescription medicines to help decrease acid production. Ask your caregiver before starting or adding any new medicines.  HOME CARE INSTRUCTIONS   Change the factors that you can control. Ask your caregiver for guidance concerning weight loss, quitting smoking, and alcohol consumption.  Avoid foods and drinks that make your symptoms worse, such as:  Caffeine or alcoholic drinks.  Chocolate.  Peppermint or mint flavorings.  Garlic and onions.  Spicy foods.  Citrus fruits, such as oranges, lemons, or limes.  Tomato-based foods such as sauce, chili, salsa, and pizza.  Fried and fatty foods.  Avoid lying down for the 3 hours prior to your bedtime or prior to taking a nap.  Eat small, frequent meals instead of large meals.  Wear loose-fitting clothing. Do not wear anything tight around your waist that causes pressure on your stomach.  Raise the head of your bed 6 to 8 inches with wood blocks to help you sleep. Extra pillows will not help.  Only take over-the-counter or prescription medicines for pain, discomfort, or fever as directed by your caregiver.  Do not take aspirin, ibuprofen, or other nonsteroidal anti-inflammatory drugs (NSAIDs). SEEK IMMEDIATE MEDICAL CARE IF:   You have pain in your arms, neck, jaw, teeth, or back.  Your pain increases or changes in intensity or duration.  You develop nausea, vomiting, or sweating (diaphoresis).  You develop shortness  of breath, or you faint.  Your vomit is green, yellow, black, or looks like coffee grounds or blood.  Your stool is red, bloody, or black. These symptoms could be signs of other problems, such as heart disease,  gastric bleeding, or esophageal bleeding. MAKE SURE YOU:   Understand these instructions.  Will watch your condition.  Will get help right away if you are not doing well or get worse. Document Released: 11/24/2004 Document Revised: 05/09/2011 Document Reviewed: 09/03/2010 [facility] Patient Information 2015 [place]. This information is not intended to replace advice given to you by your health care provider. Make sure you discuss any questions you have with your health care provider.

## 2014-07-16 NOTE — Assessment & Plan Note (Signed)
Still most likely GERD with symptoms related to intake of sugary cereal and soda and some improvement with trial of omeprazole 2 months ago. -Restarted omeprazole 20 mg daily at bedtime, trial for 3 months. -Raise head of bed 20 -Avoid trigger foods, citric acid, soda, and NSAIDs, and increase water intake. -Work on regular intentional walking to decrease anxiety. -Follow-up in one month if symptoms are still present. At that time, can trial H. Pylori testing.

## 2014-07-16 NOTE — Assessment & Plan Note (Signed)
Concern today for DM. Benign exam and weak FH making DM unlikely, but pt concerned due to increased thirst and occasional dizziness. She is only drinking about 32 oz water daily which we discussed is a major cause for both of these symptoms.  -Point-of-care CBG. -Discussed increasing water intake to 48-64 ounces daily. -Some concern of difficulty maintaining weight, so we'll also check a TSH.

## 2014-07-19 ENCOUNTER — Telehealth: Payer: Self-pay | Admitting: Family Medicine

## 2014-07-19 NOTE — Telephone Encounter (Addendum)
Left voicemail that labs from our visit looked normal and for her to f/u as we discussed if issues continue.   Documentation: TSH and random CBG were normal.  Paige SingletonMaria T Taraann Olthoff, MD

## 2014-08-22 ENCOUNTER — Ambulatory Visit (INDEPENDENT_AMBULATORY_CARE_PROVIDER_SITE_OTHER): Payer: BLUE CROSS/BLUE SHIELD | Admitting: Family Medicine

## 2014-08-22 VITALS — BP 112/80 | HR 66 | Temp 98.1°F | Ht 66.0 in | Wt 123.2 lb

## 2014-08-22 DIAGNOSIS — R42 Dizziness and giddiness: Secondary | ICD-10-CM

## 2014-08-22 DIAGNOSIS — R51 Headache: Secondary | ICD-10-CM

## 2014-08-22 DIAGNOSIS — R519 Headache, unspecified: Secondary | ICD-10-CM

## 2014-08-22 NOTE — Progress Notes (Signed)
Patient ID: Paige Neal, female   DOB: Jul 07, 1980, 34 y.o.   MRN: [id number]  Marikay Alar, MD Phone: [phone]  Paige Neal is a 34 y.o. female who presents today for same day appointment.  Patient reports one episode of light headedness and dizziness last Sunday lasting briefly. Started with light headedness then followed by vertigo type symptoms that worsened with laying down. Had some nausea with this. No CP, palpitations, or HA with this. Then notes Thursday had several episodes of sharp shooting pain in her right temple area lasting a few seconds at a time. Had 3-4 episodes. None since Thursday. No weakness, numbness, vision changes, photophobia or phonophobia, ear pain, rhinorrhea, or congestion. Has history of migraines, though this feels different. Has dull frontal HA at this time with no other symptoms.   PMH: nonsmoker.    ROS: Per HPI   Physical Exam Filed Vitals:   08/22/14 1611  BP: 112/80  Pulse: 66  Temp: 98.1 F (36.7 C)    Gen: Well NAD HEENT: PERRL,  MMM, unable to appreciate fundus due to pupillary constriction, nml TM bilaterally, no OP erythema, no cervical LAD Lungs: CTABL Nl WOB Heart: RRR, no murmur appreciated  Neuro: CN 2-12 intact, 5/5 strength in bilateral biceps, triceps, grip, quads, hamstrings, plantar and dorsiflexion, sensation to light touch intact in bilateral UE and LE, normal gait, 2+ patellar and brachioradialis reflexes No temporal tenderness Negative dix halpike Full neck ROM Exts: Non edematous BL  LE, warm and well perfused.    Assessment/Plan: Please see individual problem list.  Headache Patient with brief sharp headaches that could be consistent with trigeminal neuralgia, though with dull HA now could be tension variant or with history of migraine could be migraine variant. Neurologically intact at this time. No tenderness of temples so doubt temporal arteritis. Discussed HA diary. Tylenol for discomfort. Will  continue to monitor. Given return precautions. Discussed with Dr Lum Babe.  Light headedness Single episode of light headedness and vertigo with no other symptoms to indicate cardiac or neuro cause. Possibly related to dehydration vs BPPV vs viral cause. Asymptomatic at this time. Negative dix halpike. Negative orthostatics. Normal neuro exam. Will continue to monitor. Advised of return precautions.     No orders of the defined types were placed in this encounter.    No orders of the defined types were placed in this encounter.    Marikay Alar, MD [facility] PGY-3

## 2014-08-22 NOTE — Patient Instructions (Signed)
Nice to meet you. We will continue to monitor your headaches at this time. You neurological testing at this time is normal.  You can try tylenol for headache discomfort. Please keep a headache diary including onset of headaches, length of headaches, what you were doing, how you got them to go away, and any additional symptoms.  If you develop weakness, numbness, tingling, changes in vision, change in your speech or face, please seek medical attention.  If you develop light headedness or dizziness please seek medical attention.

## 2014-08-23 ENCOUNTER — Encounter: Payer: Self-pay | Admitting: Family Medicine

## 2014-08-23 DIAGNOSIS — R42 Dizziness and giddiness: Secondary | ICD-10-CM | POA: Insufficient documentation

## 2014-08-23 DIAGNOSIS — R51 Headache: Secondary | ICD-10-CM

## 2014-08-23 DIAGNOSIS — R519 Headache, unspecified: Secondary | ICD-10-CM | POA: Insufficient documentation

## 2014-08-23 NOTE — Assessment & Plan Note (Signed)
Single episode of light headedness and vertigo with no other symptoms to indicate cardiac or neuro cause. Possibly related to dehydration vs BPPV vs viral cause. Asymptomatic at this time. Negative dix halpike. Negative orthostatics. Normal neuro exam. Will continue to monitor. Advised of return precautions.

## 2014-08-23 NOTE — Assessment & Plan Note (Addendum)
Patient with brief sharp headaches that could be consistent with trigeminal neuralgia, though with dull HA now could be tension variant or with history of migraine could be migraine variant. Neurologically intact at this time. No tenderness of temples so doubt temporal arteritis. Discussed HA diary. Tylenol for discomfort. Will continue to monitor. Given return precautions. Discussed with Dr Lum Babe.

## 2014-09-30 ENCOUNTER — Telehealth: Payer: Self-pay | Admitting: Family Medicine

## 2014-09-30 NOTE — Telephone Encounter (Signed)
Pt has requested refill on flagyl Please advise

## 2014-09-30 NOTE — Telephone Encounter (Signed)
Patient needs to be seen in clinic before getting additional refill.  Paige Neal. [name], MD [facility] Family [facility] Resident PGY-2 09/30/2014 5:31 PM

## 2014-09-30 NOTE — Telephone Encounter (Signed)
Will forward to PCP for review. Larayne Baxley, CMA. 

## 2014-10-02 ENCOUNTER — Telehealth: Payer: Self-pay | Admitting: Family Medicine

## 2014-10-02 ENCOUNTER — Other Ambulatory Visit: Payer: Self-pay | Admitting: Family Medicine

## 2014-10-02 NOTE — Telephone Encounter (Signed)
Pt calling and frustrated that she has not received her results from this morning. She would like a phone call at the earliest convenience for these results. Paige Neal, ASA

## 2014-10-02 NOTE — Telephone Encounter (Signed)
Pt states that this is "unacceptable". Asked why she needed to come in, I explained that her new PCP has not seen her and that this was possibly the reason. She is requesting that Dr. [name] or a member of the clinical staff to return her call at the earliest convenience. Thank you, Dorothey Baseman, ASA

## 2014-10-03 ENCOUNTER — Other Ambulatory Visit: Payer: Self-pay | Admitting: Family Medicine

## 2014-10-03 ENCOUNTER — Ambulatory Visit: Payer: BLUE CROSS/BLUE SHIELD | Admitting: Family Medicine

## 2014-10-03 ENCOUNTER — Encounter: Payer: Self-pay | Admitting: Obstetrics and Gynecology

## 2014-10-03 ENCOUNTER — Other Ambulatory Visit (HOSPITAL_COMMUNITY)
Admission: RE | Admit: 2014-10-03 | Discharge: 2014-10-03 | Disposition: A | Discharge status: Home / Self Care | Payer: BLUE CROSS/BLUE SHIELD | Source: Ambulatory Visit | Attending: Family Medicine | Admitting: Family Medicine

## 2014-10-03 ENCOUNTER — Ambulatory Visit (INDEPENDENT_AMBULATORY_CARE_PROVIDER_SITE_OTHER): Payer: BLUE CROSS/BLUE SHIELD | Admitting: Obstetrics and Gynecology

## 2014-10-03 ENCOUNTER — Ambulatory Visit: Payer: BLUE CROSS/BLUE SHIELD | Admitting: Obstetrics and Gynecology

## 2014-10-03 VITALS — BP 129/84 | HR 84 | Temp 98.8°F | Wt 121.0 lb

## 2014-10-03 DIAGNOSIS — N898 Other specified noninflammatory disorders of vagina: Secondary | ICD-10-CM | POA: Diagnosis not present

## 2014-10-03 DIAGNOSIS — Z113 Encounter for screening for infections with a predominantly sexual mode of transmission: Secondary | ICD-10-CM | POA: Insufficient documentation

## 2014-10-03 DIAGNOSIS — N9489 Other specified conditions associated with female genital organs and menstrual cycle: Secondary | ICD-10-CM

## 2014-10-03 DIAGNOSIS — A499 Bacterial infection, unspecified: Secondary | ICD-10-CM

## 2014-10-03 DIAGNOSIS — B9689 Other specified bacterial agents as the cause of diseases classified elsewhere: Secondary | ICD-10-CM

## 2014-10-03 DIAGNOSIS — N760 Acute vaginitis: Secondary | ICD-10-CM | POA: Diagnosis not present

## 2014-10-03 LAB — POCT WET PREP (WET MOUNT): Clue Cells Wet Prep Whiff POC: POSITIVE

## 2014-10-03 MED ORDER — METRONIDAZOLE 500 MG PO TABS: 500.0000 mg | ORAL_TABLET | Freq: Two times a day (BID) | ORAL | Status: DC

## 2014-10-03 NOTE — Telephone Encounter (Signed)
Will discuss with patient when she comes into clinic today for her exam.  Will forward to MD seeing her to give them a heads up on situation. Jazmin Hartsell,CMA

## 2014-10-03 NOTE — Telephone Encounter (Signed)
Will talk with patient this afternoon when she comes in for appt. Kramer Hanrahan,CMA

## 2014-10-03 NOTE — Patient Instructions (Addendum)
Vaginal Discharge I will call you about the results. I believe this may be BV as well. If need be I will send in medications to your pharmacy. Call us or go to the ER if: fever, rash, side effects of medications  Come back to see [place] if symptoms not imprroved Please see handout    Bacterial Vaginosis Bacterial vaginosis is an infection of the vagina. It happens when too many of certain germs (bacteria) grow in the vagina. HOME CARE  Take your medicine as told by your doctor.  Finish your medicine even if you start to feel better.  Do not have sex until you finish your medicine and are better.  Tell your sex partner that you have an infection. They should see their doctor for treatment.  Practice safe sex. Use condoms. Have only one sex partner. GET HELP IF:  You are not getting better after 3 days of treatment.  You have more grey fluid (discharge) coming from your vagina than before.  You have more pain than before.  You have a fever. MAKE SURE YOU:   Understand these instructions.  Will watch your condition.  Will get help right away if you are not doing well or get worse. Document Released: 11/24/2007 Document Revised: 12/05/2012 Document Reviewed: 09/26/2012 [facility] Patient Information 2015 [place], [place]. This information is not intended to replace advice given to you by your health care provider. Make sure you discuss any questions you have with your health care provider.

## 2014-10-03 NOTE — Progress Notes (Signed)
   Subjective:   Patient ID: Paige Neal, female    DOB: [birth year], 34 y.o.   MRN: [id number]  Patient presents for Same Day Appointment  CC: Vaginal DIscharge  HPI: #VAGINAL DISCHARGE: Having vaginal discharge for almost a week. Medications tried: None Discharge consistency: slightly thicker Discharge color: yellowish  Increased amount of discharge Recent antibiotic use: Feb 2016 Flagyl for BV  States she gets recurrent BV Sex in last month: Yes, unsure when but >2wks Possible STD exposure: none  No new partners No itching or odor Thinks she has BV again LMP 09/12/14, no intercourse since Patient states she does not believe she is pregnant due to not having intercourse in "a while"; declined needing a pregnancy test. Believes this began when she stopped taking her probiotics  Symptoms Fever: no Dysuria: no Vaginal bleeding: no Abdomen or Pelvic pain: no Back pain: no Genital sores or ulcers: no Rash: no Pain during sex: no Missed menstrual period: no  ROS see HPI Smoking Status noted  Past medical history, surgical, family, and social history reviewed and updated in the EMR as appropriate.  Objective:  BP 129/84 mmHg  Pulse 84  Temp(Src) 98.8 F (37.1 C) (Oral)  Wt 121 lb (54.885 kg)  LMP 09/12/2014 (Exact Date) Vitals and nursing note reviewed  Physical Exam  Constitutional: She is well-developed, well-nourished, and in no distress.  Abdominal: Soft. Bowel sounds are normal. There is no tenderness.  Genitourinary: Vulva normal. Cervix is not fixed. Cervix exhibits no tenderness. Thin  fishy  white and vaginal discharge found.    Results for orders placed or performed in visit on 10/03/14 (from the past 24 hour(s))  POCT Wet Prep [facility])     Status: Abnormal   Collection Time: 10/03/14  4:33 PM  Result Value Ref Range   Source Wet Prep POC VAG    WBC, Wet Prep HPF POC 1-5    Bacteria Wet Prep HPF POC Many (A) None, Few   Clue Cells Wet Prep  HPF POC Moderate (A) None   Clue Cells Wet Prep Whiff POC Positive Whiff    Yeast Wet Prep HPF POC None    Trichomonas Wet Prep HPF POC NONE       Assessment & Plan:  See Problem List Documentation   Caryl Ada, DO 10/04/2014, 9:29 AM PGY-2, [facility]

## 2014-10-04 DIAGNOSIS — N760 Acute vaginitis: Secondary | ICD-10-CM

## 2014-10-04 DIAGNOSIS — B9689 Other specified bacterial agents as the cause of diseases classified elsewhere: Secondary | ICD-10-CM | POA: Insufficient documentation

## 2014-10-04 NOTE — Assessment & Plan Note (Signed)
Wet prep consistent with BV.  -Flagyl BID x 7 days, avoid alcohol, and f/u if not improving in 1 week or if new symptoms for reevaluation and testing.  -Handout given -Would consider preventative therapy for recurrent BV in this patient as she has >2 episodes a year.

## 2014-10-04 NOTE — Assessment & Plan Note (Signed)
A: Most likely recurrent BV. Patient without fever or other concerning symtpoms (ie pelvic pain). She declined pregnancy test.  P: Wet prep and GC/Ch obtained.

## 2014-10-06 LAB — GC/CHLAMYDIA PROBE AMP (~~LOC~~) NOT AT ARMC
Chlamydia: NEGATIVE
NEISSERIA GONORRHEA: NEGATIVE

## 2014-10-07 ENCOUNTER — Other Ambulatory Visit: Payer: Self-pay | Admitting: *Deleted

## 2014-10-08 MED ORDER — HYDROCHLOROTHIAZIDE 12.5 MG PO CAPS: ORAL_CAPSULE | ORAL | Status: DC

## 2014-10-10 ENCOUNTER — Other Ambulatory Visit: Payer: Self-pay | Admitting: Family Medicine

## 2014-10-14 ENCOUNTER — Other Ambulatory Visit: Payer: Self-pay | Admitting: *Deleted

## 2014-10-15 MED ORDER — OMEPRAZOLE 20 MG PO CPDR: DELAYED_RELEASE_CAPSULE | ORAL | Status: DC

## 2014-11-21 ENCOUNTER — Ambulatory Visit (INDEPENDENT_AMBULATORY_CARE_PROVIDER_SITE_OTHER): Payer: BLUE CROSS/BLUE SHIELD | Admitting: *Deleted

## 2014-11-21 DIAGNOSIS — Z23 Encounter for immunization: Secondary | ICD-10-CM | POA: Diagnosis not present

## 2015-01-02 ENCOUNTER — Other Ambulatory Visit: Payer: Self-pay | Admitting: Family Medicine

## 2015-01-02 NOTE — Telephone Encounter (Signed)
Rx filled.  Katina Degreealeb M. Jimmey RalphParker, MD [facility] Resident PGY-2 01/02/2015 4:47 PM

## 2015-04-06 ENCOUNTER — Other Ambulatory Visit: Payer: Self-pay | Admitting: Family Medicine

## 2015-04-06 NOTE — Telephone Encounter (Signed)
Rx filled. Patient needs a follow up office visit.  [name]. [name], MD [facility] Resident PGY-2 04/06/2015 11:49 AM

## 2015-04-29 ENCOUNTER — Other Ambulatory Visit: Payer: Self-pay | Admitting: Family Medicine

## 2015-05-01 ENCOUNTER — Other Ambulatory Visit: Payer: Self-pay | Admitting: Family Medicine

## 2015-05-01 NOTE — Telephone Encounter (Signed)
Pt is here to check on status of refill request for propranolol (INDERAL) 10 MG tablet however it was sent to previous PCP instead of Dr. Jimmey RalphParker. Please advise asap. Thank you, Dorothey BasemanSadie Reynolds, ASA

## 2015-05-01 NOTE — Telephone Encounter (Signed)
Gave 30 day supply. Patient needs appointment for further refills.  Paige Degreealeb M. Jimmey RalphParker, MD [facility] Resident PGY-2 05/01/2015 5:33 PM

## 2015-05-01 NOTE — Telephone Encounter (Signed)
Rx sent to PCP.  Clovis PuMartin, Tamika L, RN

## 2015-05-04 NOTE — Telephone Encounter (Signed)
Spoke with patient and she is aware that script has been sent to pharmacy.  She is confused as to why she needs to come in for a follow up if she has seen cardiology and doing well on the medication.  I informed her that our providers like to see their patient every 3-6 months when they are on this medication.  I did advise patient that she can check with her cardiologist and see if they want to manage this medication for her.  Patient voiced understanding and will check her schedule and then call back for an appt. Jazmin Hartsell,CMA

## 2015-05-18 ENCOUNTER — Encounter: Payer: Self-pay | Admitting: Family Medicine

## 2015-05-18 ENCOUNTER — Other Ambulatory Visit (HOSPITAL_COMMUNITY)
Admission: RE | Admit: 2015-05-18 | Discharge: 2015-05-18 | Disposition: A | Discharge status: Home / Self Care | Payer: BLUE CROSS/BLUE SHIELD | Source: Ambulatory Visit | Attending: Family Medicine | Admitting: Family Medicine

## 2015-05-18 ENCOUNTER — Ambulatory Visit (INDEPENDENT_AMBULATORY_CARE_PROVIDER_SITE_OTHER): Payer: BLUE CROSS/BLUE SHIELD | Admitting: Family Medicine

## 2015-05-18 VITALS — BP 116/77 | HR 69 | Temp 98.1°F | Wt 125.4 lb

## 2015-05-18 DIAGNOSIS — Z113 Encounter for screening for infections with a predominantly sexual mode of transmission: Secondary | ICD-10-CM | POA: Insufficient documentation

## 2015-05-18 DIAGNOSIS — N9489 Other specified conditions associated with female genital organs and menstrual cycle: Secondary | ICD-10-CM

## 2015-05-18 DIAGNOSIS — N760 Acute vaginitis: Secondary | ICD-10-CM | POA: Diagnosis not present

## 2015-05-18 DIAGNOSIS — N898 Other specified noninflammatory disorders of vagina: Secondary | ICD-10-CM

## 2015-05-18 DIAGNOSIS — B9689 Other specified bacterial agents as the cause of diseases classified elsewhere: Secondary | ICD-10-CM

## 2015-05-18 DIAGNOSIS — I10 Essential (primary) hypertension: Secondary | ICD-10-CM

## 2015-05-18 DIAGNOSIS — A499 Bacterial infection, unspecified: Secondary | ICD-10-CM

## 2015-05-18 LAB — POCT URINALYSIS DIP (MANUAL ENTRY)
BILIRUBIN UA: NEGATIVE
Bilirubin, UA: NEGATIVE
Blood, UA: NEGATIVE
Glucose, UA: NEGATIVE
NITRITE UA: NEGATIVE
PH UA: 6
PROTEIN UA: NEGATIVE
Spec Grav, UA: <=1.005
UROBILINOGEN UA: 0.2

## 2015-05-18 LAB — POCT WET PREP (WET MOUNT)
Clue Cells Wet Prep Whiff POC: NEGATIVE
WBC, Wet Prep HPF POC: >20

## 2015-05-18 LAB — POCT UA - MICROSCOPIC ONLY

## 2015-05-18 MED ORDER — METRONIDAZOLE 500 MG PO TABS: 500.0000 mg | ORAL_TABLET | Freq: Two times a day (BID) | ORAL | Status: AC

## 2015-05-18 NOTE — Assessment & Plan Note (Signed)
Well controlled today. Continue HCTZ. Consider stopping at future appointment if continues to be well controlled.

## 2015-05-18 NOTE — Progress Notes (Signed)
    Subjective:  Paige RepressChristina Neal is a 35 y.o. female [facility] chief complaint of vaginal [facility].   HPI:  Vaginal Discharge Started 2-3 weeks ago. Yellow in color. Foul smelling. Has not tried any treatments. No itching. Similar to past episodes of BV. Not sexually active. LMP 1 week ago. No fevers or chills. No abdominal pain. No dysuria.  Hypertension BP Readings from Last 3 Encounters:  05/18/15 116/77  10/03/14 129/84  08/22/14 112/80   Home BP monitoring-No Compliant with medications-yes, without side effects ROS-Denies any CP, HA, SOB, blurry vision, LE edema, transient weakness, orthopnea, PND.   ROS: Per HPI  PMH: Smoking history reviewed.    Objective:  Physical Exam: BP 116/77 mmHg  Pulse 69  Temp(Src) 98.1 F (36.7 C) (Oral)  Wt 125 lb 6.4 oz (56.881 kg)  LMP 05/10/2015 (Exact Date)  Gen: NAD, resting comfortably CV: RRR with no murmurs appreciated Pulm: NWOB, CTAB with no crackles, wheezes, or rhonchi GI: Normal bowel sounds present. Soft, Nontender, Nondistended. GU: Normal external female genitalia. Scant amount of yellow-white discharge noted in vaginal vault. No other lesions noted. Skin: warm, dry Neuro: grossly normal, moves all extremities Psych: Normal affect and thought content  Results for orders placed or performed in visit on 05/18/15 (from the past 72 hour(s))  POCT urinalysis dipstick     Status: Abnormal   Collection Time: 05/18/15  9:13 AM  Result Value Ref Range   Color, UA yellow yellow   Clarity, UA clear clear   Glucose, UA negative negative   Bilirubin, UA negative negative   Ketones, POC UA negative negative   Spec Grav, UA <=1.005    Blood, UA negative negative   pH, UA 6.0    Protein Ur, POC negative negative   Urobilinogen, UA 0.2    Nitrite, UA Negative Negative   Leukocytes, UA moderate (2+) (A) Negative  POCT UA - Microscopic Only     Status: None   Collection Time: 05/18/15  9:30 AM    Result Value Ref Range   WBC, Ur, HPF, POC 1-5    RBC, urine, microscopic NONE    Bacteria, U Microscopic FEW    Epithelial cells, urine per micros 1-5   POCT Wet Prep Mellody Drown([place])     Status: Abnormal   Collection Time: 05/18/15  9:30 AM  Result Value Ref Range   Source Wet Prep POC VAG    WBC, Wet Prep HPF POC >20    Bacteria Wet Prep HPF POC Moderate (A) None, Few, Too numerous to count   Clue Cells Wet Prep HPF POC Many (A) None, Too numerous to count   Clue Cells Wet Prep Whiff POC Negative Whiff    Yeast Wet Prep HPF POC None    Trichomonas Wet Prep HPF POC NONE    Assessment/Plan:  Vaginal Discharge. Wet prep consistent with BV. Will treat with course of flagyl. GC/Ct probe pending. HYPERTENSION Well controlled today. Continue HCTZ. Consider stopping at future appointment if continues to be well controlled.   Katina Degreealeb M. Jimmey RalphParker, MD [facility] Resident PGY-2 05/18/2015 10:04 AM

## 2015-05-18 NOTE — Patient Instructions (Signed)

## 2015-05-19 ENCOUNTER — Encounter: Payer: Self-pay | Admitting: Family Medicine

## 2015-05-19 LAB — CERVICOVAGINAL ANCILLARY ONLY
CHLAMYDIA, DNA PROBE: NEGATIVE
Neisseria Gonorrhea: NEGATIVE

## 2015-06-29 ENCOUNTER — Other Ambulatory Visit: Payer: Self-pay | Admitting: *Deleted

## 2015-06-29 MED ORDER — HYDROCHLOROTHIAZIDE 12.5 MG PO CAPS: 12.5000 mg | ORAL_CAPSULE | Freq: Every day | ORAL | Status: DC

## 2015-06-29 NOTE — Telephone Encounter (Signed)
Rx filled.  Paige Degreealeb M. Jimmey RalphParker, MD [facility] Resident PGY-2 06/29/2015 10:57 AM

## 2015-09-28 ENCOUNTER — Other Ambulatory Visit: Payer: Self-pay | Admitting: *Deleted

## 2015-09-28 MED ORDER — HYDROCHLOROTHIAZIDE 12.5 MG PO CAPS: 12.5000 mg | ORAL_CAPSULE | Freq: Every day | ORAL | 0 refills | Status: DC

## 2015-09-28 NOTE — Telephone Encounter (Signed)
30 day supply given. Patient needs office visit with labwork for further refills.  Paige Neal. [name], MD [facility] Family Medicine Resident PGY-3 09/28/2015 1:53 PM

## 2015-09-28 NOTE — Telephone Encounter (Signed)
Pt has appointment on 09/30/15. Marv Alfrey, Maryjo Rochester, CMA

## 2015-09-30 ENCOUNTER — Ambulatory Visit (INDEPENDENT_AMBULATORY_CARE_PROVIDER_SITE_OTHER): Payer: BLUE CROSS/BLUE SHIELD | Admitting: Family Medicine

## 2015-09-30 ENCOUNTER — Encounter: Payer: Self-pay | Admitting: Family Medicine

## 2015-09-30 ENCOUNTER — Other Ambulatory Visit (HOSPITAL_COMMUNITY)
Admission: RE | Admit: 2015-09-30 | Discharge: 2015-09-30 | Disposition: A | Discharge status: Home / Self Care | Payer: BLUE CROSS/BLUE SHIELD | Source: Ambulatory Visit | Attending: Family Medicine | Admitting: Family Medicine

## 2015-09-30 VITALS — BP 108/73 | HR 83 | Temp 98.6°F | Wt 119.0 lb

## 2015-09-30 DIAGNOSIS — Z1151 Encounter for screening for human papillomavirus (HPV): Secondary | ICD-10-CM | POA: Diagnosis present

## 2015-09-30 DIAGNOSIS — Z01419 Encounter for gynecological examination (general) (routine) without abnormal findings: Secondary | ICD-10-CM

## 2015-09-30 DIAGNOSIS — Z114 Encounter for screening for human immunodeficiency virus [HIV]: Secondary | ICD-10-CM | POA: Diagnosis not present

## 2015-09-30 DIAGNOSIS — Z113 Encounter for screening for infections with a predominantly sexual mode of transmission: Secondary | ICD-10-CM | POA: Insufficient documentation

## 2015-09-30 DIAGNOSIS — Z0000 Encounter for general adult medical examination without abnormal findings: Secondary | ICD-10-CM | POA: Diagnosis not present

## 2015-09-30 LAB — BASIC METABOLIC PANEL
BUN: 12 mg/dL (ref 7–25)
CALCIUM: 9.7 mg/dL (ref 8.6–10.2)
CO2: 30 mmol/L (ref 20–31)
Chloride: 101 mmol/L (ref 98–110)
Creat: 0.88 mg/dL (ref 0.50–1.10)
GLUCOSE: 74 mg/dL (ref 65–99)
Potassium: 3.6 mmol/L (ref 3.5–5.3)
SODIUM: 140 mmol/L (ref 135–146)

## 2015-09-30 LAB — CBC
HCT: 37.6 % (ref 35.0–45.0)
HEMOGLOBIN: 12.8 g/dL (ref 11.7–15.5)
MCH: 32.8 pg (ref 27.0–33.0)
MCHC: 34 g/dL (ref 32.0–36.0)
MCV: 96.4 fL (ref 80.0–100.0)
MPV: 10 fL (ref 7.5–12.5)
PLATELETS: 300 10*3/uL (ref 140–400)
RBC: 3.9 MIL/uL (ref 3.80–5.10)
RDW: 13.8 % (ref 11.0–15.0)
WBC: 4.3 10*3/uL (ref 3.8–10.8)

## 2015-09-30 LAB — TSH: TSH: 1.4 m[IU]/L

## 2015-09-30 NOTE — Patient Instructions (Signed)
You had a normal exam today. It takes 3-5 days for the tests to come back. If we need to do anything different we will call you, otherwise you will get a letter in the mail.  Please come back to see me in 6-12 months, or sooner if you need anything else.  Take care,  Dr [name]

## 2015-09-30 NOTE — Progress Notes (Signed)
Subjective:     Paige Neal is a 35 y.o. female and is here for a comprehensive physical exam. The patient reports no problems.  Social History   Social History  . Marital status: Single    Spouse name: N/A  . Number of children: N/A  . Years of education: N/A   Occupational History  . Not on file.   Social History Main Topics  . Smoking status: Never Smoker  . Smokeless tobacco: Not on file  . Alcohol use No  . Drug use: No  . Sexual activity: Not on file   Other Topics Concern  . Not on file   Social History Narrative   Pt lives in [place] and works at [employer],  single   [employer] Date Due  . INFLUENZA VACCINE  09/29/2015  . PAP SMEAR  10/29/2016  . TETANUS/TDAP  06/23/2021  . HIV Screening  Completed    The following portions of the patient's history were reviewed and updated as appropriate: allergies, current medications, past family history, past medical history, past social history, past surgical history and problem list.  Review of Systems A comprehensive review of systems was negative.   Objective:    BP 108/73   Pulse 83   Temp 98.6 F (37 C) (Oral)   Wt 119 lb (54 kg)   LMP 09/21/2015   SpO2 96%   BMI 19.21 kg/m  General appearance: alert, cooperative, appears stated age and no distress Head: Normocephalic, without obvious abnormality, atraumatic Eyes: conjunctivae/corneas clear. PERRL, EOM's intact. Fundi benign. Ears: normal TM's and external ear canals both ears Nose: Nares normal. Septum midline. Mucosa normal. No drainage or sinus tenderness. Throat: lips, mucosa, and tongue normal; teeth and gums normal Neck: no adenopathy, no carotid bruit, no JVD, supple, symmetrical, trachea midline and thyroid not enlarged, symmetric, no tenderness/mass/nodules Back: symmetric, no curvature. ROM normal. No CVA tenderness. Lungs: clear to auscultation bilaterally Breasts: normal appearance, no masses or tenderness Heart: regular  rate and rhythm, S1, S2 normal, no murmur, click, rub or gallop Abdomen: soft, non-tender; bowel sounds normal; no masses,  no organomegaly Pelvic: cervix normal in appearance, external genitalia normal, no adnexal masses or tenderness, no cervical motion tenderness, rectovaginal septum normal, uterus normal size, shape, and consistency and vagina normal without discharge Extremities: extremities normal, atraumatic, no cyanosis or edema Pulses: 2+ and symmetric Skin: Skin color, texture, turgor normal. No rashes or lesions Lymph nodes: Cervical, supraclavicular, and axillary nodes normal. Neurologic: Alert and oriented X 3, normal strength and tone. Normal symmetric reflexes. Normal coordination and gait    Assessment:    Healthy female exam.     Plan:   Will check PAP, HIV, and GC/CT per patient request. Will also check TSH, CBC, and BMP today.  Discussed healthy lifestyle including exercising at least 30 minutes daily and eating a health diet with plenty of fruits and vegetables.    See After Visit Summary for Counseling Recommendations    Follow up in 1 year for next annual physical.

## 2015-10-01 LAB — HIV ANTIBODY (ROUTINE TESTING W REFLEX): HIV: NONREACTIVE

## 2015-10-02 ENCOUNTER — Encounter: Payer: Self-pay | Admitting: Family Medicine

## 2015-10-02 LAB — CYTOLOGY - PAP

## 2015-10-14 ENCOUNTER — Encounter: Payer: Self-pay | Admitting: Family Medicine

## 2015-10-14 ENCOUNTER — Ambulatory Visit (INDEPENDENT_AMBULATORY_CARE_PROVIDER_SITE_OTHER): Payer: BLUE CROSS/BLUE SHIELD | Admitting: Family Medicine

## 2015-10-14 DIAGNOSIS — M542 Cervicalgia: Secondary | ICD-10-CM | POA: Diagnosis not present

## 2015-10-14 DIAGNOSIS — M549 Dorsalgia, unspecified: Secondary | ICD-10-CM
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
## 2015-10-16 ENCOUNTER — Ambulatory Visit: Payer: BLUE CROSS/BLUE SHIELD | Admitting: Cardiovascular Disease

## 2015-10-30 ENCOUNTER — Other Ambulatory Visit: Payer: Self-pay | Admitting: Family Medicine

## 2015-12-07 ENCOUNTER — Other Ambulatory Visit: Payer: Self-pay | Admitting: *Deleted

## 2015-12-07 MED ORDER — HYDROCHLOROTHIAZIDE 12.5 MG PO CAPS: 12.5000 mg | ORAL_CAPSULE | Freq: Every day | ORAL | 11 refills | Status: DC

## 2015-12-08 ENCOUNTER — Ambulatory Visit (INDEPENDENT_AMBULATORY_CARE_PROVIDER_SITE_OTHER): Payer: BLUE CROSS/BLUE SHIELD | Admitting: *Deleted

## 2015-12-08 DIAGNOSIS — Z23 Encounter for immunization: Secondary | ICD-10-CM

## 2016-07-29 ENCOUNTER — Ambulatory Visit (INDEPENDENT_AMBULATORY_CARE_PROVIDER_SITE_OTHER): Payer: BLUE CROSS/BLUE SHIELD | Admitting: Family Medicine

## 2016-07-29 ENCOUNTER — Encounter: Payer: Self-pay | Admitting: Family Medicine

## 2016-07-29 ENCOUNTER — Other Ambulatory Visit (HOSPITAL_COMMUNITY)
Admission: RE | Admit: 2016-07-29 | Discharge: 2016-07-29 | Disposition: A | Discharge status: Home / Self Care | Payer: BLUE CROSS/BLUE SHIELD | Source: Ambulatory Visit | Attending: Family Medicine | Admitting: Family Medicine

## 2016-07-29 VITALS — BP 106/62 | HR 93 | Temp 99.3°F | Wt 125.0 lb

## 2016-07-29 DIAGNOSIS — Z124 Encounter for screening for malignant neoplasm of cervix: Secondary | ICD-10-CM | POA: Diagnosis not present

## 2016-07-29 DIAGNOSIS — I10 Essential (primary) hypertension: Secondary | ICD-10-CM | POA: Diagnosis not present

## 2016-07-29 DIAGNOSIS — H9313 Tinnitus, bilateral: Secondary | ICD-10-CM | POA: Diagnosis not present

## 2016-07-29 DIAGNOSIS — Z01419 Encounter for gynecological examination (general) (routine) without abnormal findings: Secondary | ICD-10-CM | POA: Diagnosis not present

## 2016-07-29 DIAGNOSIS — H9319 Tinnitus, unspecified ear: Secondary | ICD-10-CM | POA: Insufficient documentation

## 2016-07-29 MED ORDER — MELOXICAM 7.5 MG PO TABS: 7.5000 mg | ORAL_TABLET | Freq: Every day | ORAL | 0 refills | Status: DC

## 2016-07-29 NOTE — Patient Instructions (Signed)
Your tests will come back early next week.  Use a white noise machine for the tinnitus.   Do the exercises and take the meloxicam.  Let us know if no improvement in 2-3 weeks.  Take care,  Dr Jimmey RalphParker

## 2016-07-29 NOTE — Progress Notes (Signed)
Subjective:  Paige Neal is a 36 y.o. female who presents to the [facility] today with a chief complaint of annual wellness exam.   HPI:  Groin Pain Started a few weeks ago. Hurts when sitting for long periods of time. No obvious precipitating events.   Tinnitus Chronic problem for patient. Feels like she always hearing a buzzing sound. No hearing loss. No vertigo or dizziness. Think it may be both ears. No loud noise exposure.   Hypertension BP Readings from Last 3 Encounters:  07/29/16 106/62  10/14/15 134/89  09/30/15 108/73   Home BP monitoring- Compliant with medications-yes, without side effects ROS-Denies any CP, HA, SOB, blurry vision, LE edema, transient weakness, orthopnea, PND.   ROS: Per HPI, otherwise all systems reviewed and are negative  PMH:  The following were reviewed and entered/updated in epic: Past Medical History:  Diagnosis Date  . HTN (hypertension)   . Hyperemesis gravidarum   . Panic attack   . Tachycardia    Patient Active Problem List   Diagnosis Date Noted  . Tinnitus 07/29/2016  . Light headedness 08/23/2014  . Prominent vein 04/19/2014  . Contraception management 11/24/2011  . Breast lump 06/27/2011  . Well woman exam 06/24/2011  . Screening for STD (sexually transmitted disease) 05/20/2010  . HYPERTENSION 04/25/2007   Past Surgical History:  Procedure Laterality Date  . BREAST FIBROADENOMA SURGERY    . gardisil x 3 - 08/30/2005      Family History  Problem Relation Age of Onset  . Coronary artery disease Mother   . Hypertension Mother   . Hypertension Father   . Hypertension Sister     Medications- reviewed and updated Current Outpatient Prescriptions  Medication Sig Dispense Refill  . ascorbic acid (VITAMIN C) 1000 MG tablet Take 1,000 mg by mouth daily. Reported on 05/18/2015    . meloxicam (MOBIC) 7.5 MG tablet Take 1 tablet (7.5 mg total) by mouth daily. 30 tablet 0  . Multiple Vitamin (MULTIVITAMIN) tablet Take 1  tablet by mouth daily. Reported on 05/18/2015     No current facility-administered medications for this visit.     Allergies-reviewed and updated No Known Allergies  Social History   Social History  . Marital status: Single    Spouse name: N/A  . Number of children: N/A  . Years of education: N/A   Social History Main Topics  . Smoking status: Never Smoker  . Smokeless tobacco: Never Used  . Alcohol use No  . Drug use: No  . Sexual activity: Not Asked   Other Topics Concern  . None   Social History Narrative   Pt lives in [place] and works at [employer],  single   Objective:  Physical Exam: BP 106/62   Pulse 93   Temp 99.3 F (37.4 C) (Oral)   Wt 125 lb (56.7 kg)   LMP 07/07/2016   SpO2 99%   BMI 20.18 kg/m   Gen: NAD, resting comfortably HEENT:  CV: RRR with no murmurs appreciated Pulm: NWOB, CTAB with no crackles, wheezes, or rhonchi GI: Normal bowel sounds present. Soft, Nontender, Nondistended. GU: Normal external female genitalia. Normal internal female genitalia.  MSK:  - LLE: Pain along left hip flexors.  Hip flexion ROM limited due to pain. Otherwise FROM. Strength 5/5. Sensation to light touch intact. Pain elicited with hip flexion.  Skin: warm, dry Neuro: grossly normal, moves all extremities Psych: Normal affect and thought content  Assessment/Plan:  Tinnitus No red flag signs or symptoms.  Normal neurological exam. Reassured patient. Recommended white noise to drown out sounds.   HYPERTENSION At goal. Stop HCTZ. Continue home monitoring. Follow up if above 140/90. Otherwise follow up in 3-6 months.   Hip Flexor Strain Start meloxicam. Discussed home exercise program. Return precautions reviewed. Follow up in 2-3 weeks if not improving.   Well Woman Exam Pap, GC/CT performed today per patient request.  Katina Degreealeb M. Jimmey RalphParker, MD [facility] Resident PGY-3 07/29/2016 4:52 PM

## 2016-07-29 NOTE — Assessment & Plan Note (Signed)
At goal. Stop HCTZ. Continue home monitoring. Follow up if above 140/90. Otherwise follow up in 3-6 months.

## 2016-07-29 NOTE — Assessment & Plan Note (Signed)
No red flag signs or symptoms. Normal neurological exam. Reassured patient. Recommended white noise to drown out sounds.

## 2016-08-02 LAB — CYTOLOGY - PAP
CHLAMYDIA, DNA PROBE: NEGATIVE
DIAGNOSIS: NEGATIVE
HPV (WINDOPATH): NOT DETECTED
Neisseria Gonorrhea: NEGATIVE

## 2016-08-03 ENCOUNTER — Encounter: Payer: Self-pay | Admitting: Family Medicine

## 2016-09-19 ENCOUNTER — Telehealth: Payer: Self-pay | Admitting: *Deleted

## 2016-09-19 MED ORDER — HYDROCHLOROTHIAZIDE 12.5 MG PO CAPS: 12.5000 mg | ORAL_CAPSULE | Freq: Every day | ORAL | 2 refills | Status: DC

## 2016-09-19 NOTE — Telephone Encounter (Signed)
Pharmacy request for hctz 12.5mg  cap qd. Last filled on 06/20/16

## 2016-09-21 ENCOUNTER — Ambulatory Visit (INDEPENDENT_AMBULATORY_CARE_PROVIDER_SITE_OTHER): Payer: BLUE CROSS/BLUE SHIELD | Admitting: Student

## 2016-09-21 ENCOUNTER — Encounter: Payer: Self-pay | Admitting: Student

## 2016-09-21 VITALS — BP 140/95 | HR 75 | Temp 99.0°F | Ht 66.0 in | Wt 127.4 lb

## 2016-09-21 DIAGNOSIS — G44209 Tension-type headache, unspecified, not intractable: Secondary | ICD-10-CM | POA: Diagnosis not present

## 2016-09-21 DIAGNOSIS — I10 Essential (primary) hypertension: Secondary | ICD-10-CM

## 2016-09-21 MED ORDER — HYDROCHLOROTHIAZIDE 12.5 MG PO CAPS: 12.5000 mg | ORAL_CAPSULE | Freq: Every day | ORAL | 2 refills | Status: DC

## 2016-09-21 NOTE — Progress Notes (Signed)
Subjective:    Paige Neal is a 36 y.o. old female here SDA for headache and blood pressure  HPI Headache: woke up with headache this morning that has gotten worse at work. She had blood pressure checked at work and was elevated to 140's/90's that prompted her to schedule SDA. She works in the [employer].  She has history of headache but felt this is different. Headache is dull and pressure-like mainly over frontal aspect of her head  bilaterally with some radiation to the back of her neck. The headache is better now without intervention. She denies nausea, vomiting, photophobia, phonophobia, vision changes, problem with speech, focal numbness, tingling or weakness. She admits chest pain earlier today that she describes as "pinch" that lasted seconds. Denies shortness of breath or palpitation. Reports bilateral leg swelling. She says her sister had leg swelling when she was taken of her blood pressure medicine which worries her. She also history of panic attack. She says she was prescribed propranolol in the past but hasn't taken it.   Blood pressure: doesn't check her blood pressure but it was elevated to 140's/90's when she checked at work. Patient was taken of her hydrochlorothiazide about a month ago because her blood pressure was normal. She says hypertension runs in her family. She maternal history of early CAD at about 35 years of age. Denies recreational drug use. Not taking NSAID.   PMH/Problem List: has HYPERTENSION; Screening for STD (sexually transmitted disease); Well woman exam; Breast lump; Contraception management; Prominent vein; Headache; Light headedness; and Tinnitus on her problem list.   has a past medical history of HTN (hypertension); Hyperemesis gravidarum; Panic attack; and Tachycardia.  FH:  Family History  Problem Relation Age of Onset  . Coronary artery disease Mother   . Hypertension Mother   . Hypertension Father   . Hypertension Sister     [facility] Social [facility]    Substance Use Topics  . Smoking status: Never Smoker  . Smokeless tobacco: Never Used  . Alcohol use No    Review of Systems Review of systems negative except for pertinent positives and negatives in history of present illness above.     Objective:     Vitals:   09/21/16 1648 09/21/16 1701  BP: (!) 144/96 (!) 140/95  Pulse: 75   Temp: 99 F (37.2 C)   TempSrc: Oral   SpO2: 99%   Weight: 127 lb 6.4 oz (57.8 kg)   Height: 5\' 6"  (1.676 m)     Physical Exam GEN: appears well, no apparent distress. Head: normocephalic and atraumatic. No tenderness over temporal areas. Eyes: conjunctiva without injection, sclera anicteric, PERRL, EOMI, visual field with normal Oropharynx: mmm without erythema or exudation HEM: negative for cervical or periauricular lymphadenopathies ENDO: negative thyromegally CVS: RRR, nl S1&S2, no murmurs, no edema, Radial and DP pulses 2+ bilaterally. No carotid or abdominal bruits RESP: no IWOB, good air movement bilaterally, CTAB GI: BS present & normal, soft, NTND MSK: no focal tenderness or notable swelling SKIN: no apparent skin lesion NEURO: Awake, alert and oriented appropriately. Cranial nerves II-XII  intact, motor 5/5 in all muscle groups of UE and LE bilaterally, normal tone, light sensation intact in all dermatomes of upper and lower ext bilaterally, no pronator drift, biceps, patellar and Achille reflexes 2+ bilaterally, finger to nose intact and normal gait PSYCH: appears anxious. Otherwise, appropriate.     Assessment and Plan:  Headache Likely tension headache based on her description of headache. Headache is better now. Doesn't appear  to be migraine or temporal arteritis. No red flags. Her blood pressure could have some contribution. She also appears anxious about her blood pressure. Cardiopulmonary and neuro exam within normal limit. Will resume her hydrochlorothiazide today. Discussed return precautions.   HYPERTENSION Blood pressure  elevated to 140's/90's. No significant change on repeat. Cardiopulmonary exam within normal limit. Not sure why she has hypertension this young. She appears anxious. I don't know if her anxiety is contributing. Will resume her HCTZ today. Unfortunately, it is too late for blood draw. Patient to follow up in two weeks. Will obtain BMP & TSH when she returns. May worth looking in to secondary causes as well given her age. Discussed return precautions in detail.  Meds ordered this encounter  Medications  . hydrochlorothiazide (MICROZIDE) 12.5 MG capsule    Sig: Take 1 capsule (12.5 mg total) by mouth daily.    Dispense:  30 capsule    Refill:  2   Return in about 2 weeks (around 10/05/2016) for HTN .  Almon Herculesaye T Gonfa, MD 09/21/16 Pager: [phone]

## 2016-09-21 NOTE — Assessment & Plan Note (Signed)
Likely tension headache based on her description of headache. Headache is better now. Doesn't appear to be migraine or temporal arteritis. No red flags. Her blood pressure could have some contribution. She also appears anxious about her blood pressure. Cardiopulmonary and neuro exam within normal limit. Will resume her hydrochlorothiazide today. Discussed return precautions.

## 2016-09-21 NOTE — Assessment & Plan Note (Addendum)
Blood pressure elevated to 140's/90's. No significant change on repeat. Cardiopulmonary exam within normal limit. Not sure why she has hypertension this young. She appears anxious. I don't know if her anxiety is contributing. Will resume her HCTZ today. Unfortunately, it is too late for blood draw. Patient to follow up in two weeks. Will obtain BMP & TSH when she returns. May worth looking in to secondary causes as well given her age. Discussed return precautions in detail.

## 2016-09-21 NOTE — Patient Instructions (Addendum)
It was great seeing you today! We have addressed the following issues today 1. Headache: This is likely tension headache. It could also be due to your blood pressure. We can resume you blood pressure medicine, hydrochlorothiazide. I recommended checking your blood pressure at home 2-3 times a week and keeping blood pressure log. I also recommend follow-up on your blood pressure in 2 weeks. Please seek immediate care if you have severe headache, vision change, arm or leg weakness, chest pain, trouble breathing or other symptoms concerning to you.   If we did any lab work today, and the results require attention, either me or my nurse will get in touch with you. If everything is normal, you will get a letter in mail and a message via . If you don't hear from us in two weeks, please give us a call. Otherwise, we look forward to seeing you again at your next visit. If you have any questions or concerns before then, please call the clinic at [phone].  Please bring all your medications to every doctors visit  Sign up for My Chart to have easy access to your labs results, and communication with your Primary care physician.    Please check-out at the front desk before leaving the clinic.    Take Care,   Dr. Alanda SlimGonfa

## 2016-10-12 ENCOUNTER — Telehealth: Payer: Self-pay | Admitting: *Deleted

## 2016-10-12 DIAGNOSIS — I10 Essential (primary) hypertension: Secondary | ICD-10-CM

## 2016-10-12 NOTE — Telephone Encounter (Signed)
Please place these orders as future in the system.  Thanks  Limited BrandsJazmin Hartsell,CMA

## 2016-10-12 NOTE — Addendum Note (Signed)
Addended by: Leland HerYOO, ELSIA J on: 10/12/2016 10:51 AM   Modules accepted: Orders

## 2016-10-12 NOTE — Telephone Encounter (Signed)
Patient is aware that labs have been ordered and I will fax it to her.  Fax number is [phone] atten Lezlie Lyeebecca Charlet with [employer] employee health. Terie Lear,CMA

## 2016-10-12 NOTE — Telephone Encounter (Signed)
Patient called to get diagnosis code and type of lab work that needed to be completed. Patient stated that her job would do it for free.  Advised patient that she needed a BMP and TSH for hypertension. Pt to fax or send PCP a copy of results.  Clovis PuMartin, Tamika L, RN

## 2016-10-12 NOTE — Addendum Note (Signed)
Addended by: Leland HerYOO, ELSIA J on: 10/12/2016 03:33 PM   Modules accepted: Orders

## 2016-10-12 NOTE — Telephone Encounter (Signed)
Patient called back stating her job would need an order from her PCP to have blood work done.  Clovis PuMartin, Lillah Standre L, RN

## 2016-10-12 NOTE — Telephone Encounter (Signed)
Place orders needed and I can print and fax to her place of employment.

## 2016-10-12 NOTE — Telephone Encounter (Signed)
Orders placed. Thanks.

## 2016-10-12 NOTE — Telephone Encounter (Signed)
Re-entered as future orders. Sorry about that.

## 2016-12-21 ENCOUNTER — Encounter: Payer: Self-pay | Admitting: Family Medicine

## 2017-03-17 ENCOUNTER — Other Ambulatory Visit: Payer: Self-pay | Admitting: Student

## 2017-03-17 DIAGNOSIS — I10 Essential (primary) hypertension: Secondary | ICD-10-CM

## 2017-03-20 ENCOUNTER — Other Ambulatory Visit: Payer: Self-pay | Admitting: *Deleted

## 2017-03-20 NOTE — Telephone Encounter (Signed)
Pt needs refill on HCTZ and propanol.  [facility] [place]

## 2017-03-21 MED ORDER — PROPRANOLOL HCL 10 MG PO TABS: 10.0000 mg | ORAL_TABLET | Freq: Every day | ORAL | 0 refills | Status: DC

## 2017-03-21 NOTE — Telephone Encounter (Signed)
Patient overdue for HTN follow up. Will give 30d refill but patient needs appointment

## 2017-03-22 NOTE — Telephone Encounter (Signed)
Patient informed and will call back once she gets to her calendar to schedule the appt. Jazmin Hartsell,CMA

## 2017-05-08 ENCOUNTER — Other Ambulatory Visit: Payer: Self-pay | Admitting: *Deleted

## 2017-05-08 DIAGNOSIS — I10 Essential (primary) hypertension: Secondary | ICD-10-CM

## 2017-05-08 MED ORDER — HYDROCHLOROTHIAZIDE 12.5 MG PO CAPS: 12.5000 mg | ORAL_CAPSULE | Freq: Every day | ORAL | 0 refills | Status: DC

## 2017-05-08 NOTE — Telephone Encounter (Signed)
Patient overdue for HTN recheck, can she please be called to schedule.

## 2017-05-08 NOTE — Telephone Encounter (Signed)
Patient has an appointment on 05-15-17.  Juanantonio Stolar,CMA

## 2017-05-15 ENCOUNTER — Ambulatory Visit: Payer: BLUE CROSS/BLUE SHIELD | Admitting: Family Medicine

## 2017-05-15 ENCOUNTER — Encounter: Payer: Self-pay | Admitting: Family Medicine

## 2017-05-15 ENCOUNTER — Other Ambulatory Visit: Payer: Self-pay

## 2017-05-15 VITALS — BP 130/84 | HR 78 | Temp 98.7°F | Ht 66.0 in | Wt 127.0 lb

## 2017-05-15 DIAGNOSIS — I10 Essential (primary) hypertension: Secondary | ICD-10-CM | POA: Diagnosis not present

## 2017-05-15 MED ORDER — HYDROCHLOROTHIAZIDE 12.5 MG PO CAPS: 12.5000 mg | ORAL_CAPSULE | Freq: Every day | ORAL | 1 refills | Status: DC

## 2017-05-15 NOTE — Progress Notes (Signed)
    Subjective:  Paige RepressChristina Neal is a 37 y.o. female who [facility]  for HTN follow up  HPI: Last seen on 09/21/16 and started back on HCTZ for elevated BPs. Had not followed up since. States has been taking HCTZ 12,5mg  regularly and tolerating well.  No CP, SOB, lightheadedness, dizziness Does think her anxiety plays a role because although she does not check BP regularly that the few times has been checked has been lower than it is today.  Voices that she would like to eat healthier and exercise more because of her strong fam hx of CAD and HTN. She goes to the gym and uses a home treadmill about once a week at most.  24-hr recall  (Up at 730 AM) B ( 730 AM)- lucky charms, silk milk almond. Water Snk (215 PM)- peanut M&Ms  L ( 230PM)- rice, cabbage, corned beef. water D ( 630PM)- spaghetti with ground Malaw[place]key meat sauce, bread. water Snk (7 PM)- single serving ice cream birthday cake Typical day? Yes except on work days drinks more water and eats more fruit. On work days wakes up 545am so gets 10am gets oatmeal with bananas.  Vegetables - like: broccoli, spinach, carrots, bell peppers,  - dislike: cooked carrots, kale, collards, peas,    ROS: Per HPI  Objective:  Physical Exam: BP 130/84   Pulse 78   Temp 98.7 F (37.1 C) (Oral)   Ht 5\' 6"  (1.676 m)   Wt 127 lb (57.6 kg)   LMP 05/01/2017 (Approximate)   SpO2 97%   BMI 20.50 kg/m   Gen: NAD, resting comfortably CV: RRR with no murmurs appreciated Pulm: NWOB, CTAB with no crackles, wheezes, or rhonchi GI: Normal bowel sounds present. Soft, Nontender, Nondistended. MSK: no edema, cyanosis, or clubbing noted Skin: warm, dry Neuro: grossly normal, moves all extremities Psych: Normal affect and thought content   Assessment/Plan:  HYPERTENSION BP controlled today and doing well on HCTZ 12.5mg  qd so will continue medication. Overdue for bloodwork so will check BMP today and also TSH as per last clinic note,  patient will need secondary work up for HTN given her young age. GAD7 today negative so anxiety is not a significant factor. Discussed healthy lifestyle modifications at length today. Follow up in 3 months.   Leland HerElsia J Yoo, DO PGY-2, [facility] 05/15/2017 4:06 PM

## 2017-05-15 NOTE — Assessment & Plan Note (Signed)
BP controlled today and doing well on HCTZ 12.5mg  qd so will continue medication. Overdue for bloodwork so will check BMP today and also TSH as per last clinic note, patient will need secondary work up for HTN given her young age. GAD7 today negative so anxiety is not a significant factor. Discussed healthy lifestyle modifications at length today. Follow up in 3 months.

## 2017-05-15 NOTE — Patient Instructions (Signed)
It was good to see you today!  For your blood pressure, - Stay on HCTZ - Some goals:  - regular moderate intensity exercise for cardioprotection  - do a 24 hour recall at home - health eating homework: things to try are to intentionally incorporate vegetables into your meals the same way you are doing with son. Edamame (in frozen section), green beans, bell peppers.    We are checking some labs today. If results require attention, either myself or my nurse will get in touch with you. If everything is normal, you will get a letter in the mail or a message in My Chart. Please give us a call if you do not hear from us after 2 weeks.   Please bring all of your medications with you to each visit.   Sign up for My Chart to have easy access to your labs results, and communication with your primary care physician.  Feel free to call with any questions or concerns at any time, at [phone].   Take care,  Dr. Leland HerElsia J Ethan Clayburn, DO [facility]

## 2017-05-16 ENCOUNTER — Encounter: Payer: Self-pay | Admitting: Family Medicine

## 2017-05-16 LAB — BASIC METABOLIC PANEL
BUN/Creatinine Ratio: 15 (ref 9–23)
BUN: 13 mg/dL (ref 6–20)
CALCIUM: 10.1 mg/dL (ref 8.7–10.2)
CHLORIDE: 100 mmol/L (ref 96–106)
CO2: 25 mmol/L (ref 20–29)
Creatinine, Ser: 0.85 mg/dL (ref 0.57–1.00)
GFR calc Af Amer: 103 mL/min/{1.73_m2} (ref >59)
GFR calc non Af Amer: 89 mL/min/{1.73_m2} (ref >59)
Glucose: 98 mg/dL (ref 65–99)
Potassium: 3.8 mmol/L (ref 3.5–5.2)
Sodium: 142 mmol/L (ref 134–144)

## 2017-05-16 LAB — TSH: TSH: 1.45 u[IU]/mL (ref 0.450–4.500)

## 2017-08-03 ENCOUNTER — Other Ambulatory Visit (HOSPITAL_COMMUNITY)
Admission: RE | Admit: 2017-08-03 | Discharge: 2017-08-03 | Disposition: A | Discharge status: Home / Self Care | Payer: BLUE CROSS/BLUE SHIELD | Source: Ambulatory Visit | Attending: Family Medicine | Admitting: Family Medicine

## 2017-08-03 ENCOUNTER — Ambulatory Visit (INDEPENDENT_AMBULATORY_CARE_PROVIDER_SITE_OTHER): Payer: BLUE CROSS/BLUE SHIELD | Admitting: Family Medicine

## 2017-08-03 ENCOUNTER — Encounter: Payer: Self-pay | Admitting: Family Medicine

## 2017-08-03 VITALS — BP 125/80 | HR 84 | Temp 98.8°F | Ht 66.0 in | Wt 122.8 lb

## 2017-08-03 DIAGNOSIS — Z01419 Encounter for gynecological examination (general) (routine) without abnormal findings: Secondary | ICD-10-CM | POA: Diagnosis present

## 2017-08-03 DIAGNOSIS — I10 Essential (primary) hypertension: Secondary | ICD-10-CM | POA: Diagnosis not present

## 2017-08-03 LAB — POCT WET PREP (WET MOUNT)
CLUE CELLS WET PREP WHIFF POC: NEGATIVE
Trichomonas Wet Prep HPF POC: ABSENT

## 2017-08-03 MED ORDER — HYDROCHLOROTHIAZIDE 12.5 MG PO CAPS: 12.5000 mg | ORAL_CAPSULE | Freq: Every day | ORAL | 1 refills | Status: DC

## 2017-08-03 NOTE — Patient Instructions (Signed)
It was good to see you today!  Blood pressure looks good, no changes to medications.   We are checking pap today. If results require attention, either myself or my nurse will get in touch with you. If everything is normal, you will get a letter in the mail or a message in My Chart. Please give us a call if you do not hear from us after 2 weeks.   Please bring all of your medications with you to each visit.   Sign up for My Chart to have easy access to your labs results, and communication with your primary care physician.  Feel free to call with any questions or concerns at any time, at [phone].   Take care,  Dr. Leland HerElsia J Jesiah Grismer, DO [facility]

## 2017-08-03 NOTE — Progress Notes (Signed)
    Subjective:  Paige Neal is a 37 y.o. female [facility]  HPI:  She would like STD testing today. Otherwise doing well. Does have chronic anxiety and stress that is at baseline.    ROS: Per HPI, otherwise all systems reviewed and negative  PMH:  The following were reviewed and entered/updated in epic: Past Medical History:  Diagnosis Date  . HTN (hypertension)   . Hyperemesis gravidarum   . Panic attack   . Tachycardia    Patient Active Problem List   Diagnosis Date Noted  . Headache 08/23/2014  . Breast lump 06/27/2011  . Well woman exam 06/24/2011  . Screening for STD (sexually transmitted disease) 05/20/2010  . HYPERTENSION 04/25/2007   Past Surgical History:  Procedure Laterality Date  . BREAST FIBROADENOMA SURGERY    . gardisil x 3 - 08/30/2005      Family History  Problem Relation Age of Onset  . Coronary artery disease Mother   . Hypertension Mother   . Hypertension Father   . Hypertension Sister     Medications- reviewed and updated Current Outpatient Medications  Medication Sig Dispense Refill  . hydrochlorothiazide (MICROZIDE) 12.5 MG capsule Take 1 capsule (12.5 mg total) by mouth daily. 90 capsule 1  . propranolol (INDERAL) 10 MG tablet Take 1 tablet (10 mg total) by mouth daily. 30 tablet 0   No current facility-administered medications for this visit.     Allergies-reviewed and updated No Known Allergies  Social History:  Exercises regularly on treadmill, elliptical, jogging, racquetball. Never smoker Rare EtOH Feels safe in relationships   [facility]  PHQ-2 Total Score  0      Objective:  Physical Exam: BP 125/80   Pulse 84   Temp 98.8 F (37.1 C) (Oral)   Ht 5\' 6"  (1.676 m)   Wt 122 lb 12.8 oz (55.7 kg)   SpO2 98%   BMI 19.82 kg/m   Gen: NAD, resting comfortably Breast: normal tissue CV: RRR with no murmurs  appreciated Pulm: NWOB, CTAB with no crackles, wheezes, or rhonchi GI: Normal bowel sounds present. Soft, Nontender, Nondistended. Pelvic exam: normal external genitalia, vulva, vagina, cervix. MSK: no edema, cyanosis, or clubbing noted Skin: warm, dry Neuro: grossly normal, moves all extremities Psych: Normal affect and thought content   Assessment/Plan:  1. HYPERTENSION Well controlled. Compliant and tolerating HCTZ well. Continue current medication. Recent BMP is WNL. - hydrochlorothiazide (MICROZIDE) 12.5 MG capsule; Take 1 capsule (12.5 mg total) by mouth daily.  Dispense: 90 capsule; Refill: 1  2. Well woman exam Normal exam. Will follow up on following labs: - POCT Wet Prep [facility] [place] - Cervicovaginal ancillary only - Cytology - PAP  Paige HerElsia J Yoo, DO PGY-2, [facility] 08/03/2017 3:22 PM

## 2017-08-07 LAB — CYTOLOGY - PAP
Diagnosis: NEGATIVE
HPV: NOT DETECTED

## 2017-08-08 ENCOUNTER — Telehealth: Payer: Self-pay | Admitting: Family Medicine

## 2017-08-08 LAB — CERVICOVAGINAL ANCILLARY ONLY
CHLAMYDIA, DNA PROBE: NEGATIVE
Neisseria Gonorrhea: NEGATIVE

## 2017-08-08 NOTE — Telephone Encounter (Signed)
Opened in error

## 2017-08-17 ENCOUNTER — Telehealth: Payer: Self-pay | Admitting: Family Medicine

## 2017-08-17 ENCOUNTER — Encounter: Payer: Self-pay | Admitting: Family Medicine

## 2017-08-17 DIAGNOSIS — R002 Palpitations: Secondary | ICD-10-CM

## 2017-08-17 NOTE — Telephone Encounter (Signed)
Pt is calling because [facility] need a new referral to do a follow up on her. She is having left side pressure, out of breath and some annoying weird feeling flutters.jw

## 2017-08-17 NOTE — Telephone Encounter (Signed)
Patient states that having intermittent flutter feeling, not worsened with exertion. She had felt this when under some stress in the past. No chest pain. Walking without difficulty. Discussed that if she should have chest pain, shortness of breath, diaphoresis, that she should go to ER. She voiced good understanding.

## 2017-08-17 NOTE — Telephone Encounter (Signed)
Will forward to MD to advise. Emmerich Cryer,CMA  

## 2017-08-18 ENCOUNTER — Ambulatory Visit (INDEPENDENT_AMBULATORY_CARE_PROVIDER_SITE_OTHER): Payer: BLUE CROSS/BLUE SHIELD | Admitting: Family Medicine

## 2017-08-18 ENCOUNTER — Ambulatory Visit (HOSPITAL_COMMUNITY)
Admission: RE | Admit: 2017-08-18 | Discharge: 2017-08-18 | Disposition: A | Discharge status: Home / Self Care | Payer: BLUE CROSS/BLUE SHIELD | Source: Ambulatory Visit | Attending: Family Medicine | Admitting: Family Medicine

## 2017-08-18 ENCOUNTER — Other Ambulatory Visit: Payer: Self-pay

## 2017-08-18 ENCOUNTER — Encounter: Payer: Self-pay | Admitting: Family Medicine

## 2017-08-18 VITALS — BP 110/72 | HR 72 | Ht 66.0 in | Wt 123.0 lb

## 2017-08-18 DIAGNOSIS — R002 Palpitations: Secondary | ICD-10-CM | POA: Insufficient documentation

## 2017-08-18 DIAGNOSIS — F411 Generalized anxiety disorder: Secondary | ICD-10-CM

## 2017-08-18 DIAGNOSIS — I10 Essential (primary) hypertension: Secondary | ICD-10-CM

## 2017-08-18 NOTE — Assessment & Plan Note (Addendum)
Negative EKG.   - she has been evaluated for heart conditions in the past.  Normal Echo in 2012.  Normal EKG's at that time as well. - Today this sounds mostly like anxiety.  General sense of "not feeling well" which she has difficult describing further.   - Continue propranolol.  - If symptoms persist, will refer to cardiology for event monitor.   - as per HTN problem -- consideration of secondary causes of HTN, which could also be contributing to palpitations.  FU with PCP - She already has an appt scheduled with cardiology on Monday.  She would like to keep this just for a second opinion and to ensure no ongoing issues.

## 2017-08-18 NOTE — Patient Instructions (Signed)
Your EKG was very good today.  If these symptoms continue, let me know and we can refer you to cardiology for an event monitor, which looks at your heart rhythm over time.   It was good to see you today

## 2017-08-18 NOTE — Assessment & Plan Note (Signed)
Controlled today.   - Continue HCTZ.   - Normal TSH - PCP has discussed wanting to do work-up for secondary causes of HTN.

## 2017-08-18 NOTE — Progress Notes (Signed)
Subjective:    Paige RepressChristina Neal is a 37 y.o. female who [facility] today for palpitations:  1.  Palpitations:  She has had ongoing feelings of "general not feeling well" for the past week or so.  Started this weekend.  Has had some strange feelings which she can only describe as fluttering in her chest.  No actual chest pain.  Some fleeting sensations of difficulty catching her breath that accompanies these flutterings.  No abdominal pain.  No N/V.  Some increased bowel urgency yesterday, but otherwise denies any diarrhea.    Has personal history of HTN for which she takes HCTZ.  Also history of anxiety for which she takes propranolol.  Takes this only as needed.  Hasn't felt like she's needed this much recently.     ROS as above per HPI.   The following portions of the patient's history were reviewed and updated as appropriate: allergies, current medications, past medical history, family and social history, and problem list. Patient is a nonsmoker.    PMH reviewed.  Past Medical History:  Diagnosis Date  . HTN (hypertension)   . Hyperemesis gravidarum   . Panic attack   . Tachycardia    Past Surgical History:  Procedure Laterality Date  . BREAST FIBROADENOMA SURGERY    . gardisil x 3 - 08/30/2005      Medications reviewed. Current Outpatient Medications  Medication Sig Dispense Refill  . hydrochlorothiazide (MICROZIDE) 12.5 MG capsule Take 1 capsule (12.5 mg total) by mouth daily. 90 capsule 1  . propranolol (INDERAL) 10 MG tablet Take 1 tablet (10 mg total) by mouth daily. 30 tablet 0   No current facility-administered medications for this visit.      Objective:   Physical Exam BP 110/72 (BP Location: Left Arm, Patient Position: Sitting, Cuff Size: Normal)   Pulse 72   Ht 5\' 6"  (1.676 m)   Wt 123 lb (55.8 kg)   SpO2 99%   BMI 19.85 kg/m  Gen:  Alert, cooperative patient who appears stated age in no acute distress.  Vital signs reviewed. HEENT: EOMI,  MMM Neck:   No LAD, no thyromegaly.   Cardiac:  Regular rate and rhythm without murmur auscultated.  Good S1/S2. Pulm:  Clear to auscultation bilaterally with good air movement.  No wheezes or rales noted.   Abd:  Soft/nondistended/nontender.   Exts: Non edematous BL  LE, warm and well perfused.

## 2017-08-21 ENCOUNTER — Encounter: Payer: Self-pay | Admitting: Cardiology

## 2017-08-21 ENCOUNTER — Encounter: Payer: Self-pay | Admitting: Family Medicine

## 2017-08-21 ENCOUNTER — Ambulatory Visit: Payer: BLUE CROSS/BLUE SHIELD | Admitting: Cardiology

## 2017-08-21 VITALS — BP 130/78 | HR 96 | Ht 66.0 in | Wt 123.1 lb

## 2017-08-21 DIAGNOSIS — R002 Palpitations: Secondary | ICD-10-CM

## 2017-08-21 DIAGNOSIS — I10 Essential (primary) hypertension: Secondary | ICD-10-CM

## 2017-08-21 DIAGNOSIS — Z13220 Encounter for screening for lipoid disorders: Secondary | ICD-10-CM

## 2017-08-21 NOTE — Progress Notes (Signed)
Cardiology Office Note:    Date:  08/21/2017   ID:  Paige Neal, DOB 08/29/1980, MRN [id number]  PCP:  Leland HerYoo, Elsia J, DO  Cardiologist:  Garwin Brothersajan R Cheyan Frees, MD   Referring MD: Leland HerYoo, Elsia J, DO    ASSESSMENT:    1. HYPERTENSION   2. Palpitations    PLAN:    In order of problems listed above:  1. Primary prevention stressed with the patient.  Importance of compliance with diet and medications stressed and she vocalized understanding.  Her blood pressure is stable.  Diet was discussed for essential hypertension also. 2. In view of her symptoms we will do exercise treadmill stress test.  Echocardiogram will be done to assess murmur heard on auscultation.  In view of history of hypertension I will do a renal artery ultrasound to rule out renal artery stenosis or fibromuscular dysplasia. 3. Her palpitations will be assessed with a 48-hour monitor.  She will be seen in follow-up appointment in 2 months or earlier if she has any concerns I want to do a lipid profile for risk stratification.  She knows to go to the nearest emergency room for significant symptoms or concerns.   Medication Adjustments/Labs and Tests Ordered: Current medicines are reviewed at length with the patient today.  Concerns regarding medicines are outlined above.  No orders of the defined types were placed in this encounter.  No orders of the defined types were placed in this encounter.    History of Present Illness:    Paige Neal is a 37 y.o. female who is being seen today for the evaluation of chest discomfort and essential hypertension at the request of Leland HerYoo, Elsia J, DO.  Patient is a pleasant 37 year old female with past medical history of essential hypertension.  She mentions to me that she has had history of SVT in the remote past.  She occasionally feels palpitations.  She says independent of that she has left arm pain at times and she is concerned about it.  No orthopnea or PND.  At the time of my  evaluation, the patient is alert awake oriented and in no distress.  She lives in active lifestyle and occasionally exercises without any symptoms.  Past Medical History:  Diagnosis Date  . HTN (hypertension)   . Hyperemesis gravidarum   . Panic attack   . Tachycardia     Past Surgical History:  Procedure Laterality Date  . BREAST FIBROADENOMA SURGERY    . gardisil x 3 - 08/30/2005      Current Medications: Current Meds  Medication Sig  . hydrochlorothiazide (MICROZIDE) 12.5 MG capsule Take 1 capsule (12.5 mg total) by mouth daily.  . propranolol (INDERAL) 10 MG tablet Take 1 tablet (10 mg total) by mouth daily. (Patient taking differently: Take 10 mg by mouth as needed. )     Allergies:   Patient has no known allergies.   Social History   Socioeconomic History  . Marital status: Single    Spouse name: Not on file  . Number of children: Not on file  . Years of education: Not on file  . Highest education level: Not on file  Occupational History  . Not on file  Social Needs  . Financial resource strain: Not on file  . Food insecurity:    Worry: Not on file    Inability: Not on file  . Transportation needs:    Medical: Not on file    Non-medical: Not on file  Tobacco Use  .  Smoking status: Never Smoker  . Smokeless tobacco: Never Used  Substance and Sexual Activity  . Alcohol use: No  . Drug use: No  . Sexual activity: Not on file  Lifestyle  . Physical activity:    Days per week: Not on file    Minutes per session: Not on file  . Stress: Not on file  Relationships  . Social connections:    Talks on phone: Not on file    Gets together: Not on file    Attends religious service: Not on file    Active member of club or organization: Not on file    Attends meetings of clubs or organizations: Not on file    Relationship status: Not on file  Other Topics Concern  . Not on file  Social History Narrative   Pt lives in [place] and works at [employer],  single      Family History: The patient's family history includes Coronary artery disease in her mother; Hypertension in her father, mother, and sister.  ROS:   Please see the history of present illness.    All other systems reviewed and are negative.  EKGs/Labs/Other Studies Reviewed:    The following studies were reviewed today: EKG was unremarkable   Recent Labs: 05/15/2017: BUN 13; Creatinine, Ser 0.85; Potassium 3.8; Sodium 142; TSH 1.450  Recent Lipid Panel    Component Value Date/Time   CHOL 129 10/04/2013   TRIG 51 10/04/2013   HDL 60 10/04/2013   CHOLHDL 2.9 Ratio 05/16/2007 1804   VLDL 18 05/16/2007 1804   LDLCALC 59 10/04/2013    Physical Exam:    VS:  BP 130/78 (BP Location: Left Arm, Patient Position: Sitting, Cuff Size: Normal)   Pulse 96   Ht 5\' 6"  (1.676 m)   Wt 123 lb 1.9 oz (55.8 kg)   SpO2 98%   BMI 19.87 kg/m     Wt Readings from Last 3 Encounters:  08/21/17 123 lb 1.9 oz (55.8 kg)  08/18/17 123 lb (55.8 kg)  08/03/17 122 lb 12.8 oz (55.7 kg)     GEN: Patient is in no acute distress HEENT: Normal NECK: No JVD; No carotid bruits LYMPHATICS: No lymphadenopathy CARDIAC: S1 S2 regular, 2/6 systolic murmur at the apex. RESPIRATORY:  Clear to auscultation without rales, wheezing or rhonchi  ABDOMEN: Soft, non-tender, non-distended MUSCULOSKELETAL:  No edema; No deformity  SKIN: Warm and dry NEUROLOGIC:  Alert and oriented x 3 PSYCHIATRIC:  Normal affect    Signed, Garwin Brothers, MD  08/21/2017 4:49 PM     [facility]

## 2017-08-21 NOTE — Patient Instructions (Signed)
Medication Instructions:  Your physician recommends that you continue on your current medications as directed. Please refer to the Current Medication list given to you today.  Labwork: Your physician recommends that you have the following labs drawn: Please go fasting for your testing at either [place] or [address] for a lipid panel to be drawn.  Testing/Procedures: Your physician has requested that you have an exercise tolerance test. For further information please visit [email or web address]. Please also follow instruction sheet, as given.  Your physician has requested that you have an echocardiogram. Echocardiography is a painless test that uses sound waves to create images of your heart. It provides your doctor with information about the size and shape of your heart and how well your heart's chambers and valves are working. This procedure takes approximately one hour. There are no restrictions for this procedure.  Your physician has requested that you have a renal artery duplex. During this test, an ultrasound is used to evaluate blood flow to the kidneys. Allow one hour for this exam. Do not eat after midnight the day before and avoid carbonated beverages. Take your medications as you usually do.  Your physician has recommended that you wear a holter monitor. Holter monitors are medical devices that record the heart's electrical activity. Doctors most often use these monitors to diagnose arrhythmias. Arrhythmias are problems with the speed or rhythm of the heartbeat. The monitor is a small, portable device. You can wear one while you do your normal daily activities. This is usually used to diagnose what is causing palpitations/syncope (passing out).  Follow-Up: Your physician recommends that you schedule a follow-up appointment in: 2 months  Any Other Special Instructions Will Be Listed Below (If Applicable).     If you need a refill on your cardiac medications before your next  appointment, please call your pharmacy.   [facility]  Garey HamAshley A, RN, BSN

## 2017-09-05 ENCOUNTER — Ambulatory Visit (INDEPENDENT_AMBULATORY_CARE_PROVIDER_SITE_OTHER): Payer: BLUE CROSS/BLUE SHIELD

## 2017-09-05 ENCOUNTER — Other Ambulatory Visit: Payer: BLUE CROSS/BLUE SHIELD

## 2017-09-05 DIAGNOSIS — R002 Palpitations: Secondary | ICD-10-CM | POA: Diagnosis not present

## 2017-09-05 DIAGNOSIS — I10 Essential (primary) hypertension: Secondary | ICD-10-CM | POA: Diagnosis not present

## 2017-09-05 LAB — EXERCISE TOLERANCE TEST
CSEPEW: 10.4 METS
Exercise duration (min): 9 min
Exercise duration (sec): 0 s
MPHR: 184 {beats}/min
Peak HR: 176 {beats}/min
Percent HR: 95 %
RPE: 15
Rest HR: 85 {beats}/min

## 2017-09-05 LAB — LIPID PANEL
CHOLESTEROL TOTAL: 130 mg/dL (ref 100–199)
Chol/HDL Ratio: 2.4 ratio (ref 0.0–4.4)
HDL: 54 mg/dL (ref >39)
LDL Calculated: 64 mg/dL (ref 0–99)
TRIGLYCERIDES: 58 mg/dL (ref 0–149)
VLDL Cholesterol Cal: 12 mg/dL (ref 5–40)

## 2017-09-06 ENCOUNTER — Encounter: Payer: Self-pay | Admitting: *Deleted

## 2017-09-06 ENCOUNTER — Encounter (INDEPENDENT_AMBULATORY_CARE_PROVIDER_SITE_OTHER): Payer: Self-pay

## 2017-09-12 ENCOUNTER — Encounter (INDEPENDENT_AMBULATORY_CARE_PROVIDER_SITE_OTHER): Payer: Self-pay

## 2017-09-13 ENCOUNTER — Ambulatory Visit (HOSPITAL_BASED_OUTPATIENT_CLINIC_OR_DEPARTMENT_OTHER)
Admission: RE | Admit: 2017-09-13 | Discharge: 2017-09-13 | Disposition: A | Discharge status: Home / Self Care | Payer: BLUE CROSS/BLUE SHIELD | Source: Ambulatory Visit | Attending: Cardiology | Admitting: Cardiology

## 2017-09-13 DIAGNOSIS — I10 Essential (primary) hypertension: Secondary | ICD-10-CM

## 2017-09-13 DIAGNOSIS — R002 Palpitations: Secondary | ICD-10-CM | POA: Insufficient documentation

## 2017-09-13 DIAGNOSIS — R011 Cardiac murmur, unspecified: Secondary | ICD-10-CM | POA: Diagnosis not present

## 2017-09-13 NOTE — Progress Notes (Signed)
Vas US Renal Artery Duplex     Renal: Right: Normal size right kidney. No evidence of right renal artery stenosis.   Left: Normal size of left kidney. No evidence of left renal artery stenosis.   Mesenteric: Normal Celiac artery and Superior Mesenteric artery findings.    09/13/17 Paige Neal

## 2017-09-13 NOTE — Progress Notes (Signed)
  Echocardiogram 2D Echocardiogram has been performed.  Paige Neal T Sarabella Caprio 09/13/2017, 11:14 AM

## 2017-09-15 ENCOUNTER — Encounter (INDEPENDENT_AMBULATORY_CARE_PROVIDER_SITE_OTHER): Payer: Self-pay

## 2017-11-27 ENCOUNTER — Ambulatory Visit (INDEPENDENT_AMBULATORY_CARE_PROVIDER_SITE_OTHER): Payer: BLUE CROSS/BLUE SHIELD | Admitting: *Deleted

## 2017-11-27 DIAGNOSIS — N912 Amenorrhea, unspecified: Secondary | ICD-10-CM | POA: Diagnosis not present

## 2017-11-27 DIAGNOSIS — Z23 Encounter for immunization: Secondary | ICD-10-CM

## 2017-11-27 LAB — POCT URINE PREGNANCY: Preg Test, Ur: POSITIVE — AB

## 2017-11-27 NOTE — Progress Notes (Signed)
Pt is here for a flu shot.  She also request a pregnancy test as she had positive ones at home.  Upreg positive, pt informed and scheduled appt for NOB  Appt scheduled for 01/04/18  Leonore Frankson, Maryjo Rochester, CMA

## 2017-11-29 ENCOUNTER — Ambulatory Visit: Payer: BLUE CROSS/BLUE SHIELD | Admitting: Student in an Organized Health Care Education/Training Program

## 2017-12-01 ENCOUNTER — Ambulatory Visit (INDEPENDENT_AMBULATORY_CARE_PROVIDER_SITE_OTHER): Payer: BLUE CROSS/BLUE SHIELD | Admitting: Cardiology

## 2017-12-01 ENCOUNTER — Encounter: Payer: Self-pay | Admitting: Cardiology

## 2017-12-01 DIAGNOSIS — Z331 Pregnant state, incidental: Secondary | ICD-10-CM | POA: Diagnosis not present

## 2017-12-01 DIAGNOSIS — Z3490 Encounter for supervision of normal pregnancy, unspecified, unspecified trimester: Secondary | ICD-10-CM | POA: Insufficient documentation

## 2017-12-01 DIAGNOSIS — I10 Essential (primary) hypertension: Secondary | ICD-10-CM

## 2017-12-01 HISTORY — DX: Essential (primary) hypertension: I10

## 2017-12-01 MED ORDER — LABETALOL HCL 100 MG PO TABS: 100.0000 mg | ORAL_TABLET | Freq: Two times a day (BID) | ORAL | 2 refills | Status: DC

## 2017-12-01 NOTE — Progress Notes (Signed)
Cardiology Office Note:    Date:  12/01/2017   ID:  Paige Neal, DOB Oct 22, 1980, MRN [id number]  PCP:  Leland Her, DO  Cardiologist:  Garwin Brothers, MD   Referring MD: Leland Her, DO    ASSESSMENT:    1. Essential hypertension   2. Pregnancy, unspecified gestational age    PLAN:    In order of problems listed above:  1. Primary prevention stressed with the patient.  Importance of compliance with diet and medication stressed.  Her blood pressure is stable.  I mentioned to her that will have to change her meds especially in view of her pregnancy.  I will stop her diuretic and propranolol.  We will change her to labetalol.  She is keeping track of her blood pressures on a regular basis and we will try to treat as needed. 2. Patient will be seen in follow-up appointment in 6 months or earlier if the patient has any concerns    Medication Adjustments/Labs and Tests Ordered: Current medicines are reviewed at length with the patient today.  Concerns regarding medicines are outlined above.  No orders of the defined types were placed in this encounter.  Meds ordered this encounter  Medications  . labetalol (NORMODYNE) 100 MG tablet    Sig: Take 1 tablet (100 mg total) by mouth 2 (two) times daily.    Dispense:  180 tablet    Refill:  2     No chief complaint on file.    History of Present Illness:    Paige Neal is a 37 y.o. female.  The patient has been evaluated by me for hypertension and treated and her blood pressure was stable.  She also underwent Holter monitoring and echo cardiographic testing was normal.  The renal artery evaluation was unremarkable.  Patient mentions to me that she just found out a couple of days ago that she is pregnant.  Her urine test reflect it to be positive.  This was done by her primary care doctor.  The patient mentions to me that she was not expecting to be pregnant and this came to her as a pleasant surprise.  Again no chest  pain orthopnea PND or any palpitations.  At the time of my evaluation, the patient is alert awake oriented and in no distress.  Past Medical History:  Diagnosis Date  . HTN (hypertension)   . Hyperemesis gravidarum   . Panic attack   . Tachycardia     Past Surgical History:  Procedure Laterality Date  . BREAST FIBROADENOMA SURGERY    . gardisil x 3 - 08/30/2005      Current Medications: Current Meds  Medication Sig  . [DISCONTINUED] hydrochlorothiazide (MICROZIDE) 12.5 MG capsule Take 1 capsule (12.5 mg total) by mouth daily.  . [DISCONTINUED] propranolol (INDERAL) 10 MG tablet Take 1 tablet (10 mg total) by mouth daily. (Patient taking differently: Take 10 mg by mouth as needed. )     Allergies:   Patient has no known allergies.   Social History   Socioeconomic History  . Marital status: Single    Spouse name: Not on file  . Number of children: Not on file  . Years of education: Not on file  . Highest education level: Not on file  Occupational History  . Not on file  Social Needs  . Financial resource strain: Not on file  . Food insecurity:    Worry: Not on file    Inability: Not on file  .  Transportation needs:    Medical: Not on file    Non-medical: Not on file  Tobacco Use  . Smoking status: Never Smoker  . Smokeless tobacco: Never Used  Substance and Sexual Activity  . Alcohol use: No  . Drug use: No  . Sexual activity: Not on file  Lifestyle  . Physical activity:    Days per week: Not on file    Minutes per session: Not on file  . Stress: Not on file  Relationships  . Social connections:    Talks on phone: Not on file    Gets together: Not on file    Attends religious service: Not on file    Active member of club or organization: Not on file    Attends meetings of clubs or organizations: Not on file    Relationship status: Not on file  Other Topics Concern  . Not on file  Social History Narrative   Pt lives in [place] and works at [employer],   single     Family History: The patient's family history includes Coronary artery disease in her mother; Hypertension in her father, mother, and sister.  ROS:   Please see the history of present illness.    All other systems reviewed and are negative.  EKGs/Labs/Other Studies Reviewed:    The following studies were reviewed today: I discussed the results of the echocardiogram and Holter monitoring with the patient at length.   Recent Labs: 05/15/2017: BUN 13; Creatinine, Ser 0.85; Potassium 3.8; Sodium 142; TSH 1.450  Recent Lipid Panel    Component Value Date/Time   CHOL 130 09/05/2017 0000   TRIG 58 09/05/2017 0000   HDL 54 09/05/2017 0000   CHOLHDL 2.4 09/05/2017 0000   CHOLHDL 2.9 Ratio 05/16/2007 1804   VLDL 18 05/16/2007 1804   LDLCALC 64 09/05/2017 0000    Physical Exam:    VS:  BP 126/72 (BP Location: Right Arm, Patient Position: Sitting, Cuff Size: Normal)   Pulse 81   Ht 5\' 6"  (1.676 m)   Wt 121 lb (54.9 kg)   SpO2 96%   BMI 19.53 kg/m     Wt Readings from Last 3 Encounters:  12/01/17 121 lb (54.9 kg)  08/21/17 123 lb 1.9 oz (55.8 kg)  08/18/17 123 lb (55.8 kg)     GEN: Patient is in no acute distress HEENT: Normal NECK: No JVD; No carotid bruits LYMPHATICS: No lymphadenopathy CARDIAC: Hear sounds regular, 2/6 systolic murmur at the apex. RESPIRATORY:  Clear to auscultation without rales, wheezing or rhonchi  ABDOMEN: Soft, non-tender, non-distended MUSCULOSKELETAL:  No edema; No deformity  SKIN: Warm and dry NEUROLOGIC:  Alert and oriented x 3 PSYCHIATRIC:  Normal affect   Signed, Garwin Brothers, MD  12/01/2017 3:14 PM    [facility]

## 2017-12-01 NOTE — Patient Instructions (Addendum)
Medication Instructions:  Your physician has recommended you make the following change in your medication:   STOP HCTZ and propanolol START labetolol 100 mg twice daily  If you need a refill on your cardiac medications before your next appointment, please call your pharmacy.   Lab work: None needed   If you have labs (blood work) drawn today and your tests are completely normal, you will receive your results only by: Marland Kitchen MyChart Message (if you have MyChart) OR . A paper copy in the mail If you have any lab test that is abnormal or we need to change your treatment, we will call you to review the results.  Testing/Procedures: Your physician recommends that you schedule a follow-up appointment in: 6 months  Follow-Up: At [facility], you and your health needs are our priority.  As part of our continuing mission to provide you with exceptional heart care, we have created designated Provider Care Teams.  These Care Teams include your primary Cardiologist (physician) and Advanced Practice Providers (APPs -  Physician Assistants and Nurse Practitioners) who all work together to provide you with the care you need, when you need it.   You will need a follow up appointment in 6 months.  Please call our office 2 months in advance to schedule this appointment.  You may see another member of our [employer] Provider Team in [place]: Gypsy Balsam, MD . Norman Herrlich, MD  Any Other Special Instructions Will Be Listed Below (If Applicable). Labetalol tablets What is this medicine? LABETALOL (la BET a lole) is a beta-blocker. Beta-blockers reduce the workload on the heart and help it to beat more regularly. This medicine is used to treat high blood pressure. This medicine may be used for other purposes; ask your health care provider or pharmacist if you have questions. COMMON BRAND NAME(S): Normodyne, Trandate What should I tell my health care provider before I take this medicine? They  need to know if you have any of these conditions: -diabetes -history of heart attack, heart disease or heart failure -kidney disease -liver disease -lung or breathing disease, like asthma or emphysema -pheochromocytoma -thyroid disease -an unusual or allergic reaction to labetalol, other beta-blockers, medicines, foods, dyes, or preservatives -pregnant or trying to get pregnant -breast-feeding How should I use this medicine? Take this medicine by mouth with a glass of water. Follow the directions on the prescription label. Take your doses at regular intervals. Do not take your medicine more often than directed. Do not stop taking this medicine suddenly. This could lead to serious heart-related effects. Talk to your pediatrician regarding the use of this medicine in children. Special care may be needed. Overdosage: If you think you have taken too much of this medicine contact a poison control center or emergency room at once. NOTE: This medicine is only for you. Do not share this medicine with others. What if I miss a dose? If you miss a dose, take it as soon as you can. If it is almost time for your next dose, take only that dose. Do not take double or extra doses. What may interact with this medicine? This medicine also interact with the following medications: -certain medicines for blood pressure, heart disease, irregular heart beat -cimetidine -general anesthetics -medicines for asthma or lung disease like albuterol -medicines for depression -nitroglycerin This list may not describe all possible interactions. Give your health care provider a list of all the medicines, herbs, non-prescription drugs, or dietary supplements you use. Also tell them  if you smoke, drink alcohol, or use illegal drugs. Some items may interact with your medicine. What should I watch for while using this medicine? Visit your doctor or health care professional for regular check ups. Check your blood pressure and  pulse rate regularly. Ask your health care professional what your blood pressure and pulse rate should be, and when you should contact him or her. You may get drowsy or dizzy. Do not drive, use machinery, or do anything that needs mental alertness until you know how this medicine affects you. Do not stand or sit up quickly. Alcohol may interfere with the effect of this medicine. Avoid alcoholic drinks. This medicine can affect blood sugar levels. If you have diabetes, check with your doctor or health care professional before you change your diet or the dose of your diabetic medicine. Do not treat yourself for coughs, colds, or pain while you are taking this medicine without asking your doctor or health care professional for advice. Some ingredients may increase your blood pressure. What side effects may I notice from receiving this medicine? Side effects that you should report to your doctor or health care professional as soon as possible: -allergic reactions like skin rash, itching or hives, swelling of the face, lips, or tongue -breathing problems -cold hands or feet -dark urine -depression -general ill feeling or flu-like symptoms -irregular heartbeat -light-colored stools -loss of appetite, nausea -pain or trouble passing urine -right upper belly pain -slow heart rate (fewer than recommended by your doctor or health care professional) -swollen legs or ankles -tingling of the scalp or skin -unusually weak or tired -vomiting -yellowing of the eyes or skin Side effects that usually do not require medical attention (report to your doctor or health care professional if they continue or are bothersome): -decreased sexual function or desire -dry itching skin -headache -tiredness This list may not describe all possible side effects. Call your doctor for medical advice about side effects. You may report side effects to FDA at 1-800-FDA-1088. Where should I keep my medicine? Keep out of the  reach of children. Store at room temperature between 15 and 30 degrees C (59 and 86 degrees F). Protect from light. Keep container tightly closed. Throw away any unused medicine after the expiration date. NOTE: This sheet is a summary. It may not cover all possible information. If you have questions about this medicine, talk to your doctor, pharmacist, or health care provider.  2018 Elsevier/Gold Standard (2012-10-19 14:34:23)    Medication Instructions:  [facility]  Garey Ham, RN, BSN

## 2017-12-02 ENCOUNTER — Other Ambulatory Visit: Payer: Self-pay | Admitting: Family Medicine

## 2017-12-02 DIAGNOSIS — I10 Essential (primary) hypertension: Secondary | ICD-10-CM

## 2017-12-05 NOTE — Telephone Encounter (Signed)
Recently changed to labetalol per cardiology.

## 2017-12-12 ENCOUNTER — Ambulatory Visit: Payer: BLUE CROSS/BLUE SHIELD | Admitting: Cardiology

## 2017-12-28 ENCOUNTER — Telehealth: Payer: Self-pay | Admitting: *Deleted

## 2017-12-28 NOTE — Telephone Encounter (Signed)
Pt called because she has one episode of vomiting this am.  She is concerned because it had a "little streak of blood in it"  Also states that she has heartburn.  Advised that it is possible that the slight blood came from the irritation of heartburn as she has not other symptoms and this is the first time she has vomited the entire pregnancy.   She is not having abdominal pain, diarrhea, fever.  Advised to use tums for heartburn and to call us if she has anymore episodes. Has NOB appt next week.   Pt also states that she check her BP right after she vomited and the "bottom number was 90".  Advised that the number could be high because she just vomited.  Asked her to sit and relax for at least 10 minutes and recheck BP.  If stays in the 90s to give [place] a call back. Jordi Kamm, Maryjo Rochester, CMA

## 2017-12-29 ENCOUNTER — Telehealth: Payer: Self-pay | Admitting: *Deleted

## 2017-12-29 NOTE — Telephone Encounter (Signed)
Acknowledged. Agree with CMA recommendations.

## 2017-12-29 NOTE — Telephone Encounter (Signed)
Patient called with concern about her blood pressure the past couple of days. Patient states her BP has been ranging from 123-138/79-96. Yesterday, she was experiencing morning sickness all day and today she has a headache but thinks it is because she needs to eat because she has not eaten much today. Dr. Tomie China advised that patient should follow up with her OB doctor for further evaluation and keep current medications the same. Patient verbalized understanding. No further questions.

## 2018-01-04 ENCOUNTER — Ambulatory Visit (INDEPENDENT_AMBULATORY_CARE_PROVIDER_SITE_OTHER): Payer: BLUE CROSS/BLUE SHIELD | Admitting: Family Medicine

## 2018-01-04 ENCOUNTER — Encounter: Payer: Self-pay | Admitting: Family Medicine

## 2018-01-04 ENCOUNTER — Other Ambulatory Visit: Payer: Self-pay

## 2018-01-04 ENCOUNTER — Other Ambulatory Visit (HOSPITAL_COMMUNITY)
Admission: RE | Admit: 2018-01-04 | Discharge: 2018-01-04 | Disposition: A | Discharge status: Home / Self Care | Payer: BLUE CROSS/BLUE SHIELD | Source: Ambulatory Visit | Attending: Family Medicine | Admitting: Family Medicine

## 2018-01-04 VITALS — BP 118/72 | HR 90 | Temp 98.9°F | Wt 124.0 lb

## 2018-01-04 DIAGNOSIS — O0991 Supervision of high risk pregnancy, unspecified, first trimester: Secondary | ICD-10-CM

## 2018-01-04 DIAGNOSIS — Z3490 Encounter for supervision of normal pregnancy, unspecified, unspecified trimester: Secondary | ICD-10-CM | POA: Insufficient documentation

## 2018-01-04 NOTE — Patient Instructions (Addendum)
Start taking aspirin 81mg  daily at 12 weeks (01/19/18).    Pregnancy After Age 36 Women who become pregnant after the age of 26 have a higher risk for certain problems during pregnancy. This is because older women may already have health problems before becoming pregnant. Older women who are healthy before pregnancy may still develop problems during pregnancy. These problems may affect the mother, the unborn baby (fetus), or both. What are the risks for me? If you are over age 62 and you want to become pregnant or are pregnant, you may have a higher risk of:  Not being able to get pregnant (infertility).  Going into labor early (preterm labor).  Needing surgical delivery of your baby (cesarean delivery, or C-section).  Having high blood pressure (hypertension).  Having complications during pregnancy, such as high blood pressure and other symptoms (preeclampsia).  Having diabetes during pregnancy (gestational diabetes).  Being pregnant with more than one baby.  Loss of the unborn baby before 20 weeks (miscarriage) or after 20 weeks of pregnancy (stillbirth).  What are the risks for my baby? Babies born to women over the age of 86 have a higher risk for:  Being born early (prematurity).  Low birth weight, which is less than 5 lb, 8 oz (2.5 kg).  Birth defects, such as Down syndrome and cleft palate.  Health complications, including problems with growth and development.  How is prenatal care different for women over age 50? All women should see their health care provider before they try to become pregnant. This is especially important for women over the age of 42. Tell your health care provider about:  Any health problems you have.  Any medicines you take.  Any family history of health problems or chromosome-related defects.  Any problems you have had with past pregnancies or deliveries.  If you are over age 62 and you plan to become pregnant:  Start taking a daily  multivitamin a month or more before you try to get pregnant. Your multivitamin should contain 400 mcg (micrograms) of folic acid.  If you are over age 47 and pregnant, make sure you:  Keep taking your multivitamin unless your health care provider tells you not to take it.  Keep all prenatal visits as told by your health care provider. This is important.  Have ultrasounds regularly throughout your pregnancy to check for problems.  Talk with your health care provider about other prenatal screening tests that you may need.  What additional prenatal tests are needed? Screening tests show whether your baby has a higher risk for birth defects than other babies. Screening tests include:  Ultrasound tests to look for markers that indicate a risk for birth defects.  Maternal blood screening. These are blood tests that measure certain substances in your blood to determine your baby's risk for defects.  Screening tests do not show whether your baby has or does not have defects. They only show your baby's risk for certain defects. If your screening tests show that risk factors are present, you may need tests to confirm the defect (diagnostic testing). These tests may include:  Chorionic villus sampling. For this procedure, a tissue sample is taken from the organ that forms in your uterus to nourish your baby (placenta). The sample is removed through your cervix or abdomen and tested.  Amniocentesis. For this procedure, a small amount of the fluid that surrounds the baby in the uterus (amniotic fluid) is removed and tested.  What can I do to stay healthy  during my pregnancy? Staying healthy during pregnancy can help you and your baby to have a lower risk for problems during pregnancy, during delivery, or both. Talk with your health care provider for specific instructions about staying healthy during your pregnancy. Nutrition  At each meal, eat a variety of foods from each of the five food groups.  These groups include: ? Proteins such as lean meats, poultry, fish that is low in fat, beans, eggs, and nuts. ? Vegetables such as leafy greens, raw and cooked vegetables, and vegetable juice. ? Fruits that are fresh, frozen, or canned, or 100% fruit juice. ? Dairy products such as low-fat yogurt, cheese, and milk. ? Whole grains including rice, cereal, pasta, and bread.  Talk with your health care provider about how much food in each group is right for you.  Follow instructions from your health care provider about eating and drinking restrictions during pregnancy. ? Do not eat raw eggs, raw meat, or raw fish or seafood. ? Do not eat any fish that contains high amounts of mercury, such as swordfish or mackerel.  Drink 6-8 or more glasses of water a day. You should drink enough fluid to keep your urine pale yellow. Managing weight gain  Ask your health care provider how much weight gain is healthy during pregnancy.  Stay at a healthy weight. If needed, work with your health care provider to lose weight safely. Activity  Exercise regularly, as directed by your health care provider. Ask your health care provider what forms of exercise are safe for you. General instructions  Do not use any products that contain nicotine or tobacco, such as cigarettes and e-cigarettes. If you need help quitting, ask your health care provider.  Do not drink alcohol, use drugs, or abuse prescription medicine.  Take over-the-counter and prescription medicines only as told by your health care provider.  Do not use hot tubs, steam rooms, or saunas.  Talk with your health care provider about your risk of exposure to harmful environmental conditions. This includes exposure to chemicals, radiation, cleaning products, and cat feces. Follow advice from your health care provider about how to limit your exposure. Summary  Women who become pregnant after the age of 20 have a higher risk for complications during  pregnancy.  Problems may affect the mother, the unborn baby (fetus), or both.  All women should see their health care provider before they try to become pregnant. This is especially important for women over the age of 30.  Staying healthy during pregnancy can help both you and your baby to have a lower risk for some of the problems that can happen during pregnancy, during delivery, or both. This information is not intended to replace advice given to you by your health care provider. Make sure you discuss any questions you have with your health care provider. Document Released: 06/06/2016 Document Revised: 06/06/2016 Document Reviewed: 06/06/2016 Elsevier Interactive Patient Education  2018 [employer].  Safe Medications in Pregnancy   Acne:  Benzoyl Peroxide  Salicylic Acid   Backache/Headache:  Tylenol: 2 regular strength every 4 hours OR        2 Extra strength every 6 hours   Colds/Coughs/Allergies:  Benadryl (alcohol free) 25 mg every 6 hours as needed  Breath right strips  Claritin  Cepacol throat lozenges  Chloraseptic throat spray  Cold-Eeze- up to three times per day  Cough drops, alcohol free  Flonase (by prescription only)  Guaifenesin  Mucinex  Robitussin DM (plain only, alcohol free)  Saline nasal spray/drops  Sudafed (pseudoephedrine) & Actifed * use only after [redacted] weeks gestation and if you do not have high blood pressure  Tylenol  Vicks Vaporub  Zinc lozenges  Zyrtec   Constipation:  Colace  Ducolax suppositories  Fleet enema  Glycerin suppositories  Metamucil  Milk of magnesia  Miralax  Senokot  Smooth move tea   Diarrhea:  Kaopectate  Imodium A-D   *NO pepto Bismol   Hemorrhoids:  Anusol  Anusol HC  Preparation H  Tucks   Indigestion:  Tums  Maalox  Mylanta  Zantac  Pepcid   Insomnia:  Benadryl (alcohol free) 25mg  every 6 hours as needed  Tylenol PM  Unisom, no Gelcaps   Leg Cramps:  Tums  MagGel    Nausea/Vomiting:  Bonine  Dramamine  Emetrol  Ginger extract  Sea bands  Meclizine  Nausea medication to take during pregnancy:  Unisom (doxylamine succinate 25 mg tablets) Take one tablet daily at bedtime. If symptoms are not adequately controlled, the dose can be increased to a maximum recommended dose of two tablets daily (1/2 tablet in the morning, 1/2 tablet mid-afternoon and one at bedtime).  Vitamin B6 100mg  tablets. Take one tablet twice a day (up to 200 mg per day).   Skin Rashes:  Aveeno products  Benadryl cream or 25mg  every 6 hours as needed  Calamine Lotion  1% cortisone cream   Yeast infection:  Gyne-lotrimin 7  Monistat 7    **If taking multiple medications, please check labels to avoid duplicating the same active ingredients  **take medication as directed on the label  ** Do not exceed 4000 mg of tylenol in 24 hours  **Do not take medications that contain aspirin or ibuprofen

## 2018-01-04 NOTE — Progress Notes (Signed)
Paige Neal is a 37 y.o. yo G2P1001 at [redacted]w[redacted]d who presents for her initial prenatal visit. Pregnancy is planned She reports morning sickness. Only has vomited maybe twice. States that nausea is overall very managable. She  is taking PNV. See flow sheet for details.  PMH, POBH, FH, meds, allergies and Social Hx reviewed.  Prenatal Exam: Gen: Well nourished, well developed.  No distress.  Vitals noted. HEENT: Normocephalic, atraumatic.  Neck supple without cervical lymphadenopathy, thyromegaly or thyroid nodules.  Fair dentition. CV: RRR no murmur, gallops or rubs Lungs: CTAB.  Normal respiratory effort without wheezes or rales. Abd: soft, NTND. +BS.  Uterus not appreciated above pelvis. GU: Normal external female genitalia without lesions.  Normal vaginal, well rugated without lesions. No vaginal discharge.  Bimanual exam: No adnexal mass or TTP. No CMT.  Uterus size not palpable. Ext: No clubbing, cyanosis or edema. Psych: Normal grooming and dress.  Not depressed or anxious appearing.  Normal thought content and process without flight of ideas or looseness of associations.  Assessment & Plan: 1) 37 y.o. yo G2P1001 at [redacted]w[redacted]d via LMP doing well.  Current pregnancy issues include cHTN. Dating is reliable. Prenatal labs collected today Genetic screening offered: patient would like when appropriate. Early glucola is not indicated.  PHQ-9 and Pregnancy Medical Home forms completed and reviewed. Bleeding and pain precautions reviewed. Importance of prenatal vitamins reviewed.   Precepted and referred to [facility] hospital clinic for high risk pregnancy d/t cHTN. Baseline CMP and urine P/C obtained today. Patient was advised to start ASA81mg  at 12w gestation.  Leland Her, DO PGY-3, [facility] 01/05/2018 12:10 PM

## 2018-01-05 LAB — OBSTETRIC PANEL, INCLUDING HIV
Antibody Screen: NEGATIVE
Basophils Absolute: 0 10*3/uL (ref 0.0–0.2)
Basos: 0 %
EOS (ABSOLUTE): 0.1 10*3/uL (ref 0.0–0.4)
Eos: 2 %
HEMOGLOBIN: 11.6 g/dL (ref 11.1–15.9)
HEP B S AG: NEGATIVE
HIV Screen 4th Generation wRfx: NONREACTIVE
Hematocrit: 34.7 % (ref 34.0–46.6)
IMMATURE GRANULOCYTES: 0 %
Immature Grans (Abs): 0 10*3/uL (ref 0.0–0.1)
LYMPHS ABS: 1.5 10*3/uL (ref 0.7–3.1)
Lymphs: 26 %
MCH: 32 pg (ref 26.6–33.0)
MCHC: 33.4 g/dL (ref 31.5–35.7)
MCV: 96 fL (ref 79–97)
Monocytes Absolute: 0.7 10*3/uL (ref 0.1–0.9)
Monocytes: 12 %
NEUTROS PCT: 60 %
Neutrophils Absolute: 3.4 10*3/uL (ref 1.4–7.0)
Platelets: 270 10*3/uL (ref 150–450)
RBC: 3.63 x10E6/uL — ABNORMAL LOW (ref 3.77–5.28)
RDW: 14 % (ref 12.3–15.4)
RPR: NONREACTIVE
RUBELLA: 2.32 {index} (ref >0.99)
Rh Factor: POSITIVE
WBC: 5.6 10*3/uL (ref 3.4–10.8)

## 2018-01-05 LAB — CMP14+EGFR
ALBUMIN: 4.1 g/dL (ref 3.5–5.5)
ALK PHOS: 57 IU/L (ref 39–117)
ALT: 12 IU/L (ref 0–32)
AST: 18 IU/L (ref 0–40)
Albumin/Globulin Ratio: 1.6 (ref 1.2–2.2)
BUN / CREAT RATIO: 13 (ref 9–23)
BUN: 9 mg/dL (ref 6–20)
CHLORIDE: 100 mmol/L (ref 96–106)
CO2: 24 mmol/L (ref 20–29)
Calcium: 9.5 mg/dL (ref 8.7–10.2)
Creatinine, Ser: 0.67 mg/dL (ref 0.57–1.00)
GFR calc non Af Amer: 113 mL/min/{1.73_m2} (ref >59)
GFR, EST AFRICAN AMERICAN: 131 mL/min/{1.73_m2} (ref >59)
GLUCOSE: 75 mg/dL (ref 65–99)
Globulin, Total: 2.5 g/dL (ref 1.5–4.5)
Potassium: 4.2 mmol/L (ref 3.5–5.2)
SODIUM: 136 mmol/L (ref 134–144)
TOTAL PROTEIN: 6.6 g/dL (ref 6.0–8.5)

## 2018-01-05 LAB — PROTEIN / CREATININE RATIO, URINE
CREATININE, UR: 166.5 mg/dL
Protein, Ur: 11.1 mg/dL
Protein/Creat Ratio: 67 mg/g creat (ref 0–200)

## 2018-01-06 LAB — CULTURE, OB URINE

## 2018-01-06 LAB — URINE CULTURE, OB REFLEX: ORGANISM ID, BACTERIA: NO GROWTH

## 2018-01-08 LAB — CERVICOVAGINAL ANCILLARY ONLY
CHLAMYDIA, DNA PROBE: NEGATIVE
Neisseria Gonorrhea: NEGATIVE
Trichomonas: NEGATIVE

## 2018-01-09 ENCOUNTER — Encounter: Payer: Self-pay | Admitting: Family Medicine

## 2018-01-22 ENCOUNTER — Ambulatory Visit: Payer: BLUE CROSS/BLUE SHIELD | Admitting: Clinical

## 2018-01-22 ENCOUNTER — Ambulatory Visit (INDEPENDENT_AMBULATORY_CARE_PROVIDER_SITE_OTHER): Payer: BLUE CROSS/BLUE SHIELD | Admitting: Family Medicine

## 2018-01-22 ENCOUNTER — Encounter: Payer: Self-pay | Admitting: Family Medicine

## 2018-01-22 VITALS — BP 133/80 | HR 82 | Wt 123.9 lb

## 2018-01-22 DIAGNOSIS — O09529 Supervision of elderly multigravida, unspecified trimester: Secondary | ICD-10-CM | POA: Insufficient documentation

## 2018-01-22 DIAGNOSIS — O0990 Supervision of high risk pregnancy, unspecified, unspecified trimester: Secondary | ICD-10-CM | POA: Insufficient documentation

## 2018-01-22 DIAGNOSIS — O10911 Unspecified pre-existing hypertension complicating pregnancy, first trimester: Secondary | ICD-10-CM

## 2018-01-22 DIAGNOSIS — O10919 Unspecified pre-existing hypertension complicating pregnancy, unspecified trimester: Secondary | ICD-10-CM | POA: Insufficient documentation

## 2018-01-22 DIAGNOSIS — O09521 Supervision of elderly multigravida, first trimester: Secondary | ICD-10-CM

## 2018-01-22 DIAGNOSIS — O0991 Supervision of high risk pregnancy, unspecified, first trimester: Secondary | ICD-10-CM

## 2018-01-22 MED ORDER — ASPIRIN EC 81 MG PO TBEC: 81.0000 mg | DELAYED_RELEASE_TABLET | Freq: Every day | ORAL | 2 refills | Status: DC

## 2018-01-22 NOTE — Patient Instructions (Addendum)
Tums for acid reduction Start aspirin 81mg   Try Gas-X for gas pain If your nausea is triggered by the prenatal vitamin, take Folic acid    Hyperemesis Gravidarum Hyperemesis gravidarum is a severe form of nausea and vomiting that happens during pregnancy. Hyperemesis is worse than morning sickness. It may cause you to have nausea or vomiting all day for many days. It may keep you from eating and drinking enough food and liquids. Hyperemesis usually occurs during the first half (the first 20 weeks) of pregnancy. It often goes away once a woman is in her second half of pregnancy. However, sometimes hyperemesis continues through an entire pregnancy. What are the causes? The cause of this condition is not known. It may be related to changes in chemicals (hormones) in the body during pregnancy, such as the high level of pregnancy hormone (human chorionic gonadotropin) or the increase in the female sex hormone (estrogen). What are the signs or symptoms? Symptoms of this condition include:  Severe nausea and vomiting.  Nausea that does not go away.  Vomiting that does not allow you to keep any food down.  Weight loss.  Body fluid loss (dehydration).  Having no desire to eat, or not liking food that you have previously enjoyed.  How is this diagnosed? This condition may be diagnosed based on:  A physical exam.  Your medical history.  Your symptoms.  Blood tests.  Urine tests.  How is this treated? This condition may be managed with medicine. If medicines to do not help relieve nausea and vomiting, you may need to receive fluids through an IV tube at the hospital. Follow these instructions at home:  Take over-the-counter and prescription medicines only as told by your health care provider.  Avoid iron pills and multivitamins that contain iron for the first 3-4 months of pregnancy. If you take prescription iron pills, do not stop taking them unless your health care provider  approves.  Take the following actions to help prevent nausea and vomiting: ? In the morning, before getting out of bed, try eating a couple of dry crackers or a piece of toast. ? Avoid foods and smells that upset your stomach. Fatty and spicy foods may make nausea worse. ? Eat 5-6 small meals a day. ? Do not drink fluids while eating meals. Drink between meals. ? Eat or suck on things that have ginger in them. Ginger can help relieve nausea. ? Avoid food preparation. The smell of food can spoil your appetite or trigger nausea.  Follow instructions from your health care provider about eating or drinking restrictions.  For snacks, eat high-protein foods, such as cheese.  Keep all follow-up and pre-birth (prenatal) visits as told by your health care provider. This is important. Contact a health care provider if:  You have pain in your abdomen.  You have a severe headache.  You have vision problems.  You are losing weight. Get help right away if:  You cannot drink fluids without vomiting.  You vomit blood.  You have constant nausea and vomiting.  You are very weak.  You are very thirsty.  You feel dizzy.  You faint.  You have a fever or other symptoms that last for more than 2-3 days.  You have a fever and your symptoms suddenly get worse. Summary  Hyperemesis gravidarum is a severe form of nausea and vomiting that happens during pregnancy.  Making some changes to your eating habits may help relieve nausea and vomiting.  This condition may  be managed with medicine.  If medicines to do not help relieve nausea and vomiting, you may need to receive fluids through an IV tube at the hospital. This information is not intended to replace advice given to you by your health care provider. Make sure you discuss any questions you have with your health care provider. Document Released: 02/14/2005 Document Revised: 10/14/2015 Document Reviewed: 10/14/2015 Elsevier Interactive  Patient Education  2017 [employer]

## 2018-01-22 NOTE — BH Specialist Note (Signed)
Integrated Behavioral Health Initial Visit  MRN: [id number] Name: Arlyss RepressChristina Burchell  Number of Integrated Behavioral Health Clinician visits:: 1/6 Session Start time: 4:55 Session End time: 5:06 Total time: 15 minutes  Type of Service: Integrated Behavioral Health- Individual/Family Interpretor:No. Interpretor Name and Language: n/a   Warm Hand Off Completed.       SUBJECTIVE: Arlyss RepressChristina Burchell is a 37 y.o. female accompanied by n/a Patient was referred by Candelaria CelesteJacob Stinson, DO for Initial OB introduction to integrated behavioral health services . Patient reports the following symptoms/concerns: Pt states no particular concern today.  Duration of problem: n/a; Severity of problem: n/a  OBJECTIVE: Mood: Normal and Affect: Appropriate Risk of harm to self or others: No plan to harm self or others  LIFE CONTEXT: Family and Social: Pt has one child (9yo) School/Work: - Self-Care: - Life Changes: Current pregnancy  GOALS ADDRESSED: Patient will: 1. Increase knowledge and/or ability of: healthy habits  2.  INTERVENTIONS: Interventions utilized: Psychoeducation and/or Health Education  Standardized Assessments completed: GAD-7 and PHQ 9  ASSESSMENT: Patient currently experiencing Supervision of high risk pregnancy, antepartum   Patient may benefit from Initial OB introduction to integrated behavioral health services .  PLAN: 1. Follow up with behavioral health clinician on : As needed 2. Behavioral recommendations:  -Continue taking prenatal vitamin, as recommended by medical provider 3. Referral(s): [facility] 4. "From scale of 1-10, how likely are you to follow plan?": 10  Rohaan Durnil C Azaria Bartell, LCSW

## 2018-01-22 NOTE — Progress Notes (Signed)
Subjective:  Paige RepressChristina Neal is a 37 y.o. G2P1001 at 6248w3d being seen today for initial prenatal care. She is currently monitored for the following issues for this high-risk pregnancy and has hypertension on her problem list.  She was referred by her PCP, Dr. Artist PaisYoo, who did an initial OB visit and ordered prenatal labs. She reports daily nausea without vomiting. She also has occasional dyspepsia. She tried taking B6 once but felt that it made the nausea worse. She denies vaginal bleeding, discharge, contractions, leaking, or abdominal pain. She has felt the occasional "twinge" in her lower abdomen that lasts a few seconds and spontaneously resolves. She is experiencing nasal congestion with mild sinus pressure that she feels has been worse since her pregnancy.    She is planning to breast feed but had some difficulty with her last baby and   Her last pregnancy was 9 years ago. She delivered at 39 weeks and a few days via spontaneous vaginal delivery with no reported complications. Baby's weight was 7lb 9oz.        The following portions of the patient's history were reviewed and updated as appropriate: allergies, current medications, past family history, past medical history, past social history, past surgical history and problem list. Problem list updated.  Objective:   Vitals:   01/22/18 1534  BP: 133/80  Pulse: 82  Weight: 123 lb 14.4 oz (56.2 kg)    Fetal Status: Fetal Heart Rate (bpm): 161         General:  Alert, oriented and cooperative. Patient is in no acute distress.  Skin: Skin is warm and dry. No rash noted.   Cardiovascular: Normal heart rate noted  Respiratory: Normal respiratory effort, no problems with respiration noted  Abdomen: Soft, gravid, appropriate for gestational age. Pain/Pressure: Absent     Pelvic: Vag. Bleeding: None Vag D/C Character: White   Cervical exam deferred        Extremities: Normal range of motion.     Mental Status: Normal mood and affect. Normal  behavior. Normal judgment and thought content.   Urinalysis:      Assessment and Plan:  Pregnancy: G2P1001 at 9248w3d  1. Supervision of high risk pregnancy, antepartum PNL normal. Tums for dyspepsia. OTC med list given. - [facility] AMB BABYSCRIPTS OPT IN - Genetic Screening  2. Chronic hypertension in pregnancy Start ASA 81mg  Discussed serial US starting at 20wks Antenatal testing at 32 weeks Delivery at 39 weeks  3. AMA (advanced maternal age) multigravida 35+, unspecified trimester Panorama today.   Preterm labor symptoms and general obstetric precautions including but not limited to vaginal bleeding, contractions, leaking of fluid and fetal movement were reviewed in detail with the patient. Please refer to After Visit Summary for other counseling recommendations.  Return in about 4 weeks (around 02/19/2018).  Levie HeritageJacob J Dylin Breeden, DO 01/22/2018 4:59 PM

## 2018-02-06 ENCOUNTER — Encounter: Payer: Self-pay | Admitting: *Deleted

## 2018-02-06 DIAGNOSIS — Z029 Encounter for administrative examinations, unspecified: Secondary | ICD-10-CM

## 2018-02-19 ENCOUNTER — Encounter: Payer: BLUE CROSS/BLUE SHIELD | Admitting: Family Medicine

## 2018-02-23 ENCOUNTER — Ambulatory Visit (INDEPENDENT_AMBULATORY_CARE_PROVIDER_SITE_OTHER): Payer: BLUE CROSS/BLUE SHIELD | Admitting: Obstetrics and Gynecology

## 2018-02-23 ENCOUNTER — Encounter: Payer: Self-pay | Admitting: Obstetrics and Gynecology

## 2018-02-23 VITALS — BP 122/84 | HR 93 | Wt 174.0 lb

## 2018-02-23 DIAGNOSIS — O0990 Supervision of high risk pregnancy, unspecified, unspecified trimester: Secondary | ICD-10-CM

## 2018-02-23 DIAGNOSIS — O10919 Unspecified pre-existing hypertension complicating pregnancy, unspecified trimester: Secondary | ICD-10-CM

## 2018-02-23 DIAGNOSIS — O09529 Supervision of elderly multigravida, unspecified trimester: Secondary | ICD-10-CM

## 2018-02-23 DIAGNOSIS — O10912 Unspecified pre-existing hypertension complicating pregnancy, second trimester: Secondary | ICD-10-CM

## 2018-02-23 DIAGNOSIS — O0992 Supervision of high risk pregnancy, unspecified, second trimester: Secondary | ICD-10-CM

## 2018-02-23 DIAGNOSIS — O09522 Supervision of elderly multigravida, second trimester: Secondary | ICD-10-CM

## 2018-02-23 NOTE — Patient Instructions (Addendum)
Intrauterine Device Information An intrauterine device (IUD) is a medical device that is inserted in the uterus to prevent pregnancy. It is a small, T-shaped device that has one or two nylon strings hanging down from it. The strings hang out of the lower part of the uterus (cervix) to allow for future IUD removal. There are two types of IUDs available:  Hormone IUD. This type of IUD is made of plastic and contains the hormone progestin (synthetic progesterone). A hormone IUD may last 3-5 years.  Copper IUD. This type of IUD has copper wire wrapped around it. A copper IUD may last up to 10 years. How is an IUD inserted? An IUD is inserted through the vagina and placed into the uterus with a minor medical procedure. The exact procedure for IUD insertion may vary among health care providers and hospitals. How does an IUD work? Synthetic progesterone in a hormonal IUD prevents pregnancy by:  Thickening cervical mucus to prevent sperm from entering the uterus.  Thinning the uterine lining to prevent a fertilized egg from being implanted there. Copper in a copper IUD prevents pregnancy by making the uterus and fallopian tubes produce a fluid that kills sperm. What are the advantages of an IUD? Advantages of either type of IUD  It is highly effective in preventing pregnancy.  It is reversible. You can become pregnant shortly after the IUD is removed.  It is low-maintenance and can stay in place for a long time.  There are no estrogen-related side effects.  It can be used when breastfeeding.  It is not associated with weight gain.  It can be inserted right after childbirth, an abortion, or a miscarriage. Advantages of a hormone IUD  If it is inserted within 7 days of your period starting, it works right after it is inserted. If the hormone IUD is inserted at any other time in your cycle, you will need to use a backup method of birth control for 7 days after insertion.  It can make  menstrual periods lighter.  It can reduce menstrual cramping.  It can be used for 3-5 years. Advantages of a copper IUD  It works right after it is inserted.  It can be used as a form of emergency birth control if it is inserted within 5 days after having unprotected sex.  It does not interfere with your body's natural hormones.  It can be used for 10 years. What are the disadvantages of an IUD?  An IUD may cause irregular menstrual bleeding for a period of time after insertion.  You may have pain during insertion and have cramping and vaginal bleeding after insertion.  An IUD may cut the uterus (uterine perforation) when it is inserted. This is rare.  An IUD may cause pelvic inflammatory disease (PID), which is an infection in the uterus and fallopian tubes. This is rare, and it usually happens during the first 20 days after the IUD is inserted.  A copper IUD can make your menstrual flow heavier and more painful. How is an IUD removed?  You will lie on your back with your knees bent and your feet in footrests (stirrups).  A device will be inserted into your vagina to spread apart the vaginal walls (speculum). This will allow your health care provider to see the strings attached to the IUD.  Your health care provider will use a small instrument (forceps) to grasp the IUD strings and pull firmly until the IUD is removed. You may have some discomfort   when the IUD is removed. Your health care provider may recommend taking over-the-counter pain relievers, such as ibuprofen, before the procedure. You may also have minor spotting for a few days after the procedure. The exact procedure for IUD removal may vary among health care providers and hospitals. Is the IUD right for me? Your health care provider will make sure you are a good candidate for an IUD and will discuss the advantages, disadvantages, and possible side effects with you. Summary  An intrauterine device (IUD) is a medical  device that is inserted in the uterus to prevent pregnancy. It is a small, T-shaped device that has one or two nylon strings hanging down from it.  A hormone IUD contains the hormone progestin (synthetic progesterone). A copper IUD has copper wire wrapped around it.  Synthetic progesterone in a hormone IUD prevents pregnancy by thickening cervical mucus and thinning the walls of the uterus. Copper in a copper IUD prevents pregnancy by making the uterus and fallopian tubes produce a fluid that kills sperm.  A hormone IUD can be left in place for 3-5 years. A copper IUD can be left in place for up to 10 years.  An IUD is inserted and removed by a health care provider. You may feel some pain during insertion and removal. Your health care provider may recommend taking over-the-counter pain medicine, such as ibuprofen, before an IUD procedure. This information is not intended to replace advice given to you by your health care provider. Make sure you discuss any questions you have with your health care provider. Document Released: 01/19/2004 Document Revised: 03/15/2016 Document Reviewed: 03/15/2016 Elsevier Interactive Patient Education  2019 [employer]. Contraception Choices Contraception, also called birth control, refers to methods or devices that prevent pregnancy. Hormonal methods Contraceptive implant A contraceptive implant is a thin, plastic tube that contains a hormone. It is inserted into the upper part of the arm. It can remain in place for up to 3 years. Progestin-only injections Progestin-only injections are injections of progestin, a synthetic form of the hormone progesterone. They are given every 3 months by a health care provider. Birth control pills Birth control pills are pills that contain hormones that prevent pregnancy. They must be taken once a day, preferably at the same time each day. Birth control patch The birth control patch contains hormones that prevent pregnancy. It  is placed on the skin and must be changed once a week for three weeks and removed on the fourth week. A prescription is needed to use this method of contraception. Vaginal ring A vaginal ring contains hormones that prevent pregnancy. It is placed in the vagina for three weeks and removed on the fourth week. After that, the process is repeated with a new ring. A prescription is needed to use this method of contraception. Emergency contraceptive Emergency contraceptives prevent pregnancy after unprotected sex. They come in pill form and can be taken up to 5 days after sex. They work best the sooner they are taken after having sex. Most emergency contraceptives are available without a prescription. This method should not be used as your only form of birth control. Barrier methods Female condom A female condom is a thin sheath that is worn over the penis during sex. Condoms keep sperm from going inside a woman's body. They can be used with a spermicide to increase their effectiveness. They should be disposed after a single use. Female condom A female condom is a soft, loose-fitting sheath that is put into the vagina  before sex. The condom keeps sperm from going inside a woman's body. They should be disposed after a single use.  Intrauterine contraception Intrauterine device (IUD) An IUD is a T-shaped device that is put in a woman's uterus. There are two types:  Hormone IUD.This type contains progestin, a synthetic form of the hormone progesterone. This type can stay in place for 3-5 years.  Copper IUD.This type is wrapped in copper wire. It can stay in place for 10 years.  Permanent methods of contraception Female tubal ligation In this method, a woman's fallopian tubes are sealed, tied, or blocked during surgery to prevent eggs from traveling to the uterus.  Female sterilization This is a procedure to tie off the tubes that carry sperm (vasectomy). After the procedure, the man can still ejaculate  fluid (semen).  Summary  Contraception, also called birth control, means methods or devices that prevent pregnancy.  Hormonal methods of contraception include implants, injections, pills, patches, vaginal rings, and emergency contraceptives.  Barrier methods of contraception can include female condoms, female condoms, diaphragms, cervical caps, sponges, and spermicides.  There are two types of IUDs (intrauterine devices). An IUD can be put in a woman's uterus to prevent pregnancy for 3-5 years.  Permanent sterilization can be done through a procedure for males, females, or both. This information is not intended to replace advice given to you by your health care provider. Make sure you discuss any questions you have with your health care provider. Document Released: 02/14/2005 Document Revised: 03/19/2016 Document Reviewed: 03/19/2016 Elsevier Interactive Patient Education  2018 [employer]

## 2018-02-23 NOTE — Progress Notes (Signed)
   PRENATAL VISIT NOTE  Subjective:  Paige Neal is a 37 y.o. G2P1001 at 5311w0d being seen today for ongoing prenatal care.  She is currently monitored for the following issues for this high-risk pregnancy and has HYPERTENSION; Well woman exam; Breast lump; Headache; Palpitations; Essential hypertension; Supervision of high risk pregnancy, antepartum; Chronic hypertension in pregnancy; and AMA (advanced maternal age) multigravida 35+, unspecified trimester on their problem list.  Patient reports sinus congestion.  Contractions: Not present. Vag. Bleeding: None.  Movement: Present. Denies leaking of fluid.   The following portions of the patient's history were reviewed and updated as appropriate: allergies, current medications, past family history, past medical history, past social history, past surgical history and problem list. Problem list updated.  Objective:   Vitals:   02/23/18 0944  BP: 122/84  Pulse: 93  Weight: 174 lb (78.9 kg)    Fetal Status: Fetal Heart Rate (bpm): 145   Movement: Present     General:  Alert, oriented and cooperative. Patient is in no acute distress.  Skin: Skin is warm and dry. No rash noted.   Cardiovascular: Normal heart rate noted  Respiratory: Normal respiratory effort, no problems with respiration noted  Abdomen: Soft, gravid, appropriate for gestational age.  Pain/Pressure: Present     Pelvic: Cervical exam deferred        Extremities: Normal range of motion.  Edema: None  Mental Status: Normal mood and affect. Normal behavior. Normal judgment and thought content.   Assessment and Plan:  Pregnancy: G2P1001 at 511w0d  1. Supervision of high risk pregnancy, antepartum Anatomy scheduled for 03/09/18 Reviewed options for contraception, gave info  2. AMA (advanced maternal age) multigravida 35+, unspecified trimester  3. Chronic hypertension in pregnancy On labetalol 100 mg BID Cont baby ASA BP stable  Preterm labor symptoms and general  obstetric precautions including but not limited to vaginal bleeding, contractions, leaking of fluid and fetal movement were reviewed in detail with the patient. Please refer to After Visit Summary for other counseling recommendations.  Return in about 4 weeks (around 03/23/2018) for OB visit (MD).  Future Appointments  Date Time Provider Department Center  03/09/2018  2:30 PM [facility] [place] 5 [facility]    Conan BowensKelly M Dara Camargo, MD

## 2018-02-28 NOTE — L&D Delivery Note (Signed)
OB/GYN Faculty Practice Delivery Note  Paige Neal is a 38 y.o. P1P2162 s/p SVD at [redacted]w[redacted]d. She was admitted for induction of labor for superimposed severe preeclampsia.   ROM: 3h 50m with meconium-stained fluid GBS Status: negative Maximum Maternal Temperature: Temp (48hrs), Avg:98.5 F (36.9 C), Min:97.6 F (36.4 C), Max:99.1 F (37.3 C)  Labor Progress: . Induction started evening 5/8 with cytotec, FB . FB out 5/10 am and transitioned to pitocin . SROM light meconium fluid  Delivery Date/Time: 07/08/18 at 1242 Delivery: Called to room and patient was complete and pushing. Head delivered ROA. No nuchal cord present. Shoulder and body delivered in usual fashion. Infant with spontaneous cry, placed on mother's abdomen, dried and stimulated. Cord clamped x 2 after 1-minute delay, and cut by provider. Cord blood drawn. Placenta delivered spontaneously with gentle cord traction. Fundus firm with massage and Pitocin. Labia, perineum, vagina, and cervix inspected inspected with 1st degree perineal laceration.   Placenta: spontaneous, intact, 3-vessel cord (to be discarded) Complications: terminal meconium  increased bleeding noted after delivery of placenta related to decreased uterine tone - rectal cytotec given and sweep of lower uterine segment with improvement in bleeding  Lacerations: 1st degree perineal repaired with 4-0 Monocryl EBL: 772cc per triton Analgesia: epidural  Postpartum Planning [x]  message to sent to schedule follow-up  [x]  vaccines UTD  Infant: Vigorous infant  APGARs 8, 9  2764g (6lb 1.5oz)  [place] Behlke S. Earlene Plater, DO OB/GYN Fellow, Faculty Practice

## 2018-03-02 ENCOUNTER — Encounter (HOSPITAL_COMMUNITY): Payer: Self-pay

## 2018-03-09 ENCOUNTER — Encounter (HOSPITAL_COMMUNITY): Payer: Self-pay

## 2018-03-09 ENCOUNTER — Ambulatory Visit (HOSPITAL_COMMUNITY)
Admission: RE | Admit: 2018-03-09 | Discharge: 2018-03-09 | Disposition: A | Discharge status: Home / Self Care | Payer: BLUE CROSS/BLUE SHIELD | Source: Ambulatory Visit | Attending: Obstetrics and Gynecology | Admitting: Obstetrics and Gynecology

## 2018-03-09 DIAGNOSIS — O10012 Pre-existing essential hypertension complicating pregnancy, second trimester: Secondary | ICD-10-CM | POA: Diagnosis not present

## 2018-03-09 DIAGNOSIS — O10919 Unspecified pre-existing hypertension complicating pregnancy, unspecified trimester: Secondary | ICD-10-CM | POA: Insufficient documentation

## 2018-03-09 DIAGNOSIS — O0990 Supervision of high risk pregnancy, unspecified, unspecified trimester: Secondary | ICD-10-CM | POA: Diagnosis present

## 2018-03-09 DIAGNOSIS — Z3A19 19 weeks gestation of pregnancy: Secondary | ICD-10-CM | POA: Diagnosis not present

## 2018-03-09 DIAGNOSIS — Z363 Encounter for antenatal screening for malformations: Secondary | ICD-10-CM

## 2018-03-09 DIAGNOSIS — O09522 Supervision of elderly multigravida, second trimester: Secondary | ICD-10-CM | POA: Diagnosis not present

## 2018-03-12 ENCOUNTER — Other Ambulatory Visit (HOSPITAL_COMMUNITY): Payer: Self-pay | Admitting: *Deleted

## 2018-03-12 DIAGNOSIS — Z362 Encounter for other antenatal screening follow-up: Secondary | ICD-10-CM

## 2018-03-22 ENCOUNTER — Ambulatory Visit (INDEPENDENT_AMBULATORY_CARE_PROVIDER_SITE_OTHER): Payer: BLUE CROSS/BLUE SHIELD | Admitting: Obstetrics and Gynecology

## 2018-03-22 ENCOUNTER — Encounter: Payer: Self-pay | Admitting: Obstetrics and Gynecology

## 2018-03-22 VITALS — BP 125/70 | HR 70 | Wt 134.3 lb

## 2018-03-22 DIAGNOSIS — O10912 Unspecified pre-existing hypertension complicating pregnancy, second trimester: Secondary | ICD-10-CM

## 2018-03-22 DIAGNOSIS — O0992 Supervision of high risk pregnancy, unspecified, second trimester: Secondary | ICD-10-CM

## 2018-03-22 DIAGNOSIS — O10919 Unspecified pre-existing hypertension complicating pregnancy, unspecified trimester: Secondary | ICD-10-CM

## 2018-03-22 DIAGNOSIS — O09529 Supervision of elderly multigravida, unspecified trimester: Secondary | ICD-10-CM

## 2018-03-22 DIAGNOSIS — O0990 Supervision of high risk pregnancy, unspecified, unspecified trimester: Secondary | ICD-10-CM

## 2018-03-22 DIAGNOSIS — O09522 Supervision of elderly multigravida, second trimester: Secondary | ICD-10-CM

## 2018-03-22 NOTE — Progress Notes (Signed)
Prenatal Visit Note Date: 03/22/2018 Clinic: [facility]  Subjective:  Paige Neal is a 38 y.o. G2P1001 at [redacted]w[redacted]d being seen today for ongoing prenatal care.  She is currently monitored for the following issues for this high-risk pregnancy and has Breast lump; Headache; Palpitations; Essential hypertension; Supervision of high risk pregnancy, antepartum; Chronic hypertension in pregnancy; and AMA (advanced maternal age) multigravida 35+, unspecified trimester on their problem list.  Patient reports no complaints.   Contractions: Not present. Vag. Bleeding: None.  Movement: Present. Denies leaking of fluid.   The following portions of the patient's history were reviewed and updated as appropriate: allergies, current medications, past family history, past medical history, past social history, past surgical history and problem list. Problem list updated.  Objective:   Vitals:   03/22/18 1605  BP: 125/70  Pulse: 70  Weight: 134 lb 4.8 oz (60.9 kg)    Fetal Status: Fetal Heart Rate (bpm): 148   Movement: Present     General:  Alert, oriented and cooperative. Patient is in no acute distress.  Skin: Skin is warm and dry. No rash noted.   Cardiovascular: Normal heart rate noted  Respiratory: Normal respiratory effort, no problems with respiration noted  Abdomen: Soft, gravid, appropriate for gestational age. Pain/Pressure: Present     Pelvic:  Cervical exam deferred        Extremities: Normal range of motion.  Edema: None  Mental Status: Normal mood and affect. Normal behavior. Normal judgment and thought content.   Urinalysis:      Assessment and Plan:  Pregnancy: G2P1001 at [redacted]w[redacted]d  1. Chronic hypertension in pregnancy Doing well on labetalol 100/100 and low dose asa. Has u/s already scheduled  2. Supervision of high risk pregnancy, antepartum Declines afp  3. AMA (advanced maternal age) multigravida 35+, unspecified trimester Low risk panorama, normal  anatomy u/s  Preterm labor symptoms and general obstetric precautions including but not limited to vaginal bleeding, contractions, leaking of fluid and fetal movement were reviewed in detail with the patient. Please refer to After Visit Summary for other counseling recommendations.  Return in about 3 weeks (around 04/12/2018).   [place] Bing, MD

## 2018-03-22 NOTE — Patient Instructions (Signed)
[email or web address]

## 2018-04-06 ENCOUNTER — Ambulatory Visit (HOSPITAL_COMMUNITY)
Admission: RE | Admit: 2018-04-06 | Discharge: 2018-04-06 | Disposition: A | Discharge status: Home / Self Care | Payer: BLUE CROSS/BLUE SHIELD | Source: Ambulatory Visit | Attending: Obstetrics and Gynecology | Admitting: Obstetrics and Gynecology

## 2018-04-06 ENCOUNTER — Encounter (HOSPITAL_COMMUNITY): Payer: Self-pay

## 2018-04-06 DIAGNOSIS — O09522 Supervision of elderly multigravida, second trimester: Secondary | ICD-10-CM | POA: Diagnosis not present

## 2018-04-06 DIAGNOSIS — Z3A23 23 weeks gestation of pregnancy: Secondary | ICD-10-CM

## 2018-04-06 DIAGNOSIS — O0990 Supervision of high risk pregnancy, unspecified, unspecified trimester: Secondary | ICD-10-CM | POA: Diagnosis present

## 2018-04-06 DIAGNOSIS — Z362 Encounter for other antenatal screening follow-up: Secondary | ICD-10-CM

## 2018-04-06 DIAGNOSIS — O10012 Pre-existing essential hypertension complicating pregnancy, second trimester: Secondary | ICD-10-CM

## 2018-04-09 ENCOUNTER — Other Ambulatory Visit (HOSPITAL_COMMUNITY): Payer: Self-pay | Admitting: *Deleted

## 2018-04-09 DIAGNOSIS — O10912 Unspecified pre-existing hypertension complicating pregnancy, second trimester: Secondary | ICD-10-CM

## 2018-04-12 ENCOUNTER — Ambulatory Visit (INDEPENDENT_AMBULATORY_CARE_PROVIDER_SITE_OTHER): Payer: BLUE CROSS/BLUE SHIELD | Admitting: Obstetrics & Gynecology

## 2018-04-12 VITALS — BP 134/89 | HR 70 | Wt 139.6 lb

## 2018-04-12 DIAGNOSIS — O0990 Supervision of high risk pregnancy, unspecified, unspecified trimester: Secondary | ICD-10-CM

## 2018-04-12 DIAGNOSIS — O0992 Supervision of high risk pregnancy, unspecified, second trimester: Secondary | ICD-10-CM

## 2018-04-12 DIAGNOSIS — Z3A23 23 weeks gestation of pregnancy: Secondary | ICD-10-CM

## 2018-04-12 LAB — POCT URINALYSIS DIP (DEVICE)
Bilirubin Urine: NEGATIVE
Glucose, UA: NEGATIVE mg/dL
Hgb urine dipstick: NEGATIVE
Ketones, ur: NEGATIVE mg/dL
Leukocytes,Ua: NEGATIVE
NITRITE: NEGATIVE
PROTEIN: NEGATIVE mg/dL
Specific Gravity, Urine: 1.015 (ref 1.005–1.030)
Urobilinogen, UA: 0.2 mg/dL (ref 0.0–1.0)
pH: 7 (ref 5.0–8.0)

## 2018-04-12 NOTE — Patient Instructions (Signed)

## 2018-04-12 NOTE — Progress Notes (Signed)
   PRENATAL VISIT NOTE  Subjective:  Paige Neal is a 38 y.o. G2P1001 at [redacted]w[redacted]d being seen today for ongoing prenatal care.  She is currently monitored for the following issues for this high-risk pregnancy and has Headache; Palpitations; Essential hypertension; Supervision of high risk pregnancy, antepartum; Chronic hypertension in pregnancy; and AMA (advanced maternal age) multigravida 35+, unspecified trimester on their problem list.  Patient reports occasional contractions.  Contractions: Irritability. Vag. Bleeding: None.  Movement: Present. Denies leaking of fluid.   The following portions of the patient's history were reviewed and updated as appropriate: allergies, current medications, past family history, past medical history, past social history, past surgical history and problem list. Problem list updated.  Objective:   Vitals:   04/12/18 1633  BP: 134/89  Pulse: 70  Weight: 139 lb 9.6 oz (63.3 kg)    Fetal Status: Fetal Heart Rate (bpm): 153   Movement: Present     General:  Alert, oriented and cooperative. Patient is in no acute distress.  Skin: Skin is warm and dry. No rash noted.   Cardiovascular: Normal heart rate noted  Respiratory: Normal respiratory effort, no problems with respiration noted  Abdomen: Soft, gravid, appropriate for gestational age.  Pain/Pressure: Present     Pelvic: Cervical exam deferred        Extremities: Normal range of motion.  Edema: Trace  Mental Status: Normal mood and affect. Normal behavior. Normal judgment and thought content.   Assessment and Plan:  Pregnancy: G2P1001 at [redacted]w[redacted]d  1. Supervision of high risk pregnancy, antepartum Genetic screen - AFP, Serum, Open Spina Bifida - SMN1 Copy Number Analysis - Cystic Fibrosis Mutation 97  Preterm labor symptoms and general obstetric precautions including but not limited to vaginal bleeding, contractions, leaking of fluid and fetal movement were reviewed in detail with the  patient. Please refer to After Visit Summary for other counseling recommendations.  Return in about 3 weeks (around 05/03/2018) for 2 hr GTT.  Future Appointments  Date Time Provider Department Center  05/04/2018  3:30 PM [facility] [place] 1 [facility]    Scheryl Darter, MD

## 2018-04-24 LAB — AFP, SERUM, OPEN SPINA BIFIDA
AFP MoM: 1.27
AFP Value: 133.6 ng/mL
Gest. Age on Collection Date: 23.6 weeks
Maternal Age At EDD: 37.1 yr
OSBR Risk 1 IN: 10000
Test Results:: NEGATIVE
Weight: 139 [lb_av]

## 2018-04-24 LAB — INHERITEST(R) CF/SMA PANEL

## 2018-05-03 ENCOUNTER — Other Ambulatory Visit: Payer: Self-pay

## 2018-05-03 ENCOUNTER — Other Ambulatory Visit: Payer: BLUE CROSS/BLUE SHIELD

## 2018-05-03 ENCOUNTER — Ambulatory Visit (INDEPENDENT_AMBULATORY_CARE_PROVIDER_SITE_OTHER): Payer: BLUE CROSS/BLUE SHIELD | Admitting: Obstetrics and Gynecology

## 2018-05-03 ENCOUNTER — Encounter: Payer: Self-pay | Admitting: Obstetrics and Gynecology

## 2018-05-03 VITALS — BP 136/86 | HR 77 | Wt 143.9 lb

## 2018-05-03 DIAGNOSIS — Z3A26 26 weeks gestation of pregnancy: Secondary | ICD-10-CM

## 2018-05-03 DIAGNOSIS — O0990 Supervision of high risk pregnancy, unspecified, unspecified trimester: Secondary | ICD-10-CM

## 2018-05-03 DIAGNOSIS — O0992 Supervision of high risk pregnancy, unspecified, second trimester: Secondary | ICD-10-CM

## 2018-05-03 DIAGNOSIS — Z23 Encounter for immunization: Secondary | ICD-10-CM | POA: Diagnosis not present

## 2018-05-03 DIAGNOSIS — O09529 Supervision of elderly multigravida, unspecified trimester: Secondary | ICD-10-CM

## 2018-05-03 DIAGNOSIS — O09522 Supervision of elderly multigravida, second trimester: Secondary | ICD-10-CM

## 2018-05-03 DIAGNOSIS — O10912 Unspecified pre-existing hypertension complicating pregnancy, second trimester: Secondary | ICD-10-CM

## 2018-05-03 DIAGNOSIS — O10919 Unspecified pre-existing hypertension complicating pregnancy, unspecified trimester: Secondary | ICD-10-CM

## 2018-05-03 NOTE — Progress Notes (Signed)
Subjective:  Paige Neal is a 38 y.o. G2P1001 at [redacted]w[redacted]d being seen today for ongoing prenatal care.  She is currently monitored for the following issues for this high-risk pregnancy and has Headache; Palpitations; Essential hypertension; Supervision of high risk pregnancy, antepartum; Chronic hypertension in pregnancy; and AMA (advanced maternal age) multigravida 35+, unspecified trimester on their problem list.  Patient reports no complaints.  Contractions: Irritability. Vag. Bleeding: None.  Movement: Present. Denies leaking of fluid.   The following portions of the patient's history were reviewed and updated as appropriate: allergies, current medications, past family history, past medical history, past social history, past surgical history and problem list. Problem list updated.  Objective:   Vitals:   05/03/18 0836  BP: 136/86  Pulse: 77  Weight: 65.3 kg    Fetal Status: Fetal Heart Rate (bpm): 154   Movement: Present     General:  Alert, oriented and cooperative. Patient is in no acute distress.  Skin: Skin is warm and dry. No rash noted.   Cardiovascular: Normal heart rate noted  Respiratory: Normal respiratory effort, no problems with respiration noted  Abdomen: Soft, gravid, appropriate for gestational age. Pain/Pressure: Present     Pelvic:  Cervical exam deferred        Extremities: Normal range of motion.  Edema: Trace  Mental Status: Normal mood and affect. Normal behavior. Normal judgment and thought content.   Urinalysis:      Assessment and Plan:  Pregnancy: G2P1001 at [redacted]w[redacted]d  1. Supervision of high risk pregnancy, antepartum Stable Considering BTL, papers signed today - Tdap vaccine greater than or equal to 7yo IM  2. Chronic hypertension in pregnancy BP stable Growth scan tomorrow  3. AMA (advanced maternal age) multigravida 35+, unspecified trimester LR NIPS  Preterm labor symptoms and general obstetric precautions including but not limited to  vaginal bleeding, contractions, leaking of fluid and fetal movement were reviewed in detail with the patient. Please refer to After Visit Summary for other counseling recommendations.  Return in about 2 weeks (around 05/17/2018) for OB visit.   Hermina Staggers, MD

## 2018-05-03 NOTE — Patient Instructions (Signed)
Third Trimester of Pregnancy The third trimester is from week 28 through week 40 (months 7 through 9). The third trimester is a time when the unborn baby (fetus) is growing rapidly. At the end of the ninth month, the fetus is about 20 inches in length and weighs 6-10 pounds. Body changes during your third trimester Your body will continue to go through many changes during pregnancy. The changes vary from woman to woman. During the third trimester:  Your weight will continue to increase. You can expect to gain 25-35 pounds (11-16 kg) by the end of the pregnancy.  You may begin to get stretch marks on your hips, abdomen, and breasts.  You may urinate more often because the fetus is moving lower into your pelvis and pressing on your bladder.  You may develop or continue to have heartburn. This is caused by increased hormones that slow down muscles in the digestive tract.  You may develop or continue to have constipation because increased hormones slow digestion and cause the muscles that push waste through your intestines to relax.  You may develop hemorrhoids. These are swollen veins (varicose veins) in the rectum that can itch or be painful.  You may develop swollen, bulging veins (varicose veins) in your legs.  You may have increased body aches in the pelvis, back, or thighs. This is due to weight gain and increased hormones that are relaxing your joints.  You may have changes in your hair. These can include thickening of your hair, rapid growth, and changes in texture. Some women also have hair loss during or after pregnancy, or hair that feels dry or thin. Your hair will most likely return to normal after your baby is born.  Your breasts will continue to grow and they will continue to become tender. A yellow fluid (colostrum) may leak from your breasts. This is the first milk you are producing for your baby.  Your belly button may stick out.  You may notice more swelling in your hands,  face, or ankles.  You may have increased tingling or numbness in your hands, arms, and legs. The skin on your belly may also feel numb.  You may feel short of breath because of your expanding uterus.  You may have more problems sleeping. This can be caused by the size of your belly, increased need to urinate, and an increase in your body's metabolism.  You may notice the fetus "dropping," or moving lower in your abdomen (lightening).  You may have increased vaginal discharge.  You may notice your joints feel loose and you may have pain around your pelvic bone. What to expect at prenatal visits You will have prenatal exams every 2 weeks until week 36. Then you will have weekly prenatal exams. During a routine prenatal visit:  You will be weighed to make sure you and the baby are growing normally.  Your blood pressure will be taken.  Your abdomen will be measured to track your baby's growth.  The fetal heartbeat will be listened to.  Any test results from the previous visit will be discussed.  You may have a cervical check near your due date to see if your cervix has softened or thinned (effaced).  You will be tested for Group B streptococcus. This happens between 35 and 37 weeks. Your health care provider may ask you:  What your birth plan is.  How you are feeling.  If you are feeling the baby move.  If you have had any abnormal   symptoms, such as leaking fluid, bleeding, severe headaches, or abdominal cramping.  If you are using any tobacco products, including cigarettes, chewing tobacco, and electronic cigarettes.  If you have any questions. Other tests or screenings that may be performed during your third trimester include:  Blood tests that check for low iron levels (anemia).  Fetal testing to check the health, activity level, and growth of the fetus. Testing is done if you have certain medical conditions or if there are problems during the pregnancy.  Nonstress test  (NST). This test checks the health of your baby to make sure there are no signs of problems, such as the baby not getting enough oxygen. During this test, a belt is placed around your belly. The baby is made to move, and its heart rate is monitored during movement. What is false labor? False labor is a condition in which you feel small, irregular tightenings of the muscles in the womb (contractions) that usually go away with rest, changing position, or drinking water. These are called Braxton Hicks contractions. Contractions may last for hours, days, or even weeks before true labor sets in. If contractions come at regular intervals, become more frequent, increase in intensity, or become painful, you should see your health care provider. What are the signs of labor?  Abdominal cramps.  Regular contractions that start at 10 minutes apart and become stronger and more frequent with time.  Contractions that start on the top of the uterus and spread down to the lower abdomen and back.  Increased pelvic pressure and dull back pain.  A watery or bloody mucus discharge that comes from the vagina.  Leaking of amniotic fluid. This is also known as your "water breaking." It could be a slow trickle or a gush. Let your health care provider know if it has a color or strange odor. If you have any of these signs, call your health care provider right away, even if it is before your due date. Follow these instructions at home: Medicines  Follow your health care provider's instructions regarding medicine use. Specific medicines may be either safe or unsafe to take during pregnancy.  Take a prenatal vitamin that contains at least 600 micrograms (mcg) of folic acid.  If you develop constipation, try taking a stool softener if your health care provider approves. Eating and drinking   Eat a balanced diet that includes fresh fruits and vegetables, whole grains, good sources of protein such as meat, eggs, or tofu,  and low-fat dairy. Your health care provider will help you determine the amount of weight gain that is right for you.  Avoid raw meat and uncooked cheese. These carry germs that can cause birth defects in the baby.  If you have low calcium intake from food, talk to your health care provider about whether you should take a daily calcium supplement.  Eat four or five small meals rather than three large meals a day.  Limit foods that are high in fat and processed sugars, such as fried and sweet foods.  To prevent constipation: ? Drink enough fluid to keep your urine clear or pale yellow. ? Eat foods that are high in fiber, such as fresh fruits and vegetables, whole grains, and beans. Activity  Exercise only as directed by your health care provider. Most women can continue their usual exercise routine during pregnancy. Try to exercise for 30 minutes at least 5 days a week. Stop exercising if you experience uterine contractions.  Avoid heavy lifting.  Do   not exercise in extreme heat or humidity, or at high altitudes.  Wear low-heel, comfortable shoes.  Practice good posture.  You may continue to have sex unless your health care provider tells you otherwise. Relieving pain and discomfort  Take frequent breaks and rest with your legs elevated if you have leg cramps or low back pain.  Take warm sitz baths to soothe any pain or discomfort caused by hemorrhoids. Use hemorrhoid cream if your health care provider approves.  Wear a good support bra to prevent discomfort from breast tenderness.  If you develop varicose veins: ? Wear support pantyhose or compression stockings as told by your healthcare provider. ? Elevate your feet for 15 minutes, 3-4 times a day. Prenatal care  Write down your questions. Take them to your prenatal visits.  Keep all your prenatal visits as told by your health care provider. This is important. Safety  Wear your seat belt at all times when driving.  Make  a list of emergency phone numbers, including numbers for family, friends, the hospital, and police and fire departments. General instructions  Avoid cat litter boxes and soil used by cats. These carry germs that can cause birth defects in the baby. If you have a cat, ask someone to clean the litter box for you.  Do not travel far distances unless it is absolutely necessary and only with the approval of your health care provider.  Do not use hot tubs, steam rooms, or saunas.  Do not drink alcohol.  Do not use any products that contain nicotine or tobacco, such as cigarettes and e-cigarettes. If you need help quitting, ask your health care provider.  Do not use any medicinal herbs or unprescribed drugs. These chemicals affect the formation and growth of the baby.  Do not douche or use tampons or scented sanitary pads.  Do not cross your legs for long periods of time.  To prepare for the arrival of your baby: ? Take prenatal classes to understand, practice, and ask questions about labor and delivery. ? Make a trial run to the hospital. ? Visit the hospital and tour the maternity area. ? Arrange for maternity or paternity leave through employers. ? Arrange for family and friends to take care of pets while you are in the hospital. ? Purchase a rear-facing car seat and make sure you know how to install it in your car. ? Pack your hospital bag. ? Prepare the baby's nursery. Make sure to remove all pillows and stuffed animals from the baby's crib to prevent suffocation.  Visit your dentist if you have not gone during your pregnancy. Use a soft toothbrush to brush your teeth and be gentle when you floss. Contact a health care provider if:  You are unsure if you are in labor or if your water has broken.  You become dizzy.  You have mild pelvic cramps, pelvic pressure, or nagging pain in your abdominal area.  You have lower back pain.  You have persistent nausea, vomiting, or  diarrhea.  You have an unusual or bad smelling vaginal discharge.  You have pain when you urinate. Get help right away if:  Your water breaks before 37 weeks.  You have regular contractions less than 5 minutes apart before 37 weeks.  You have a fever.  You are leaking fluid from your vagina.  You have spotting or bleeding from your vagina.  You have severe abdominal pain or cramping.  You have rapid weight loss or weight gain.  You have   shortness of breath with chest pain.  You notice sudden or extreme swelling of your face, hands, ankles, feet, or legs.  Your baby makes fewer than 10 movements in 2 hours.  You have severe headaches that do not go away when you take medicine.  You have vision changes. Summary  The third trimester is from week 28 through week 40, months 7 through 9. The third trimester is a time when the unborn baby (fetus) is growing rapidly.  During the third trimester, your discomfort may increase as you and your baby continue to gain weight. You may have abdominal, leg, and back pain, sleeping problems, and an increased need to urinate.  During the third trimester your breasts will keep growing and they will continue to become tender. A yellow fluid (colostrum) may leak from your breasts. This is the first milk you are producing for your baby.  False labor is a condition in which you feel small, irregular tightenings of the muscles in the womb (contractions) that eventually go away. These are called Braxton Hicks contractions. Contractions may last for hours, days, or even weeks before true labor sets in.  Signs of labor can include: abdominal cramps; regular contractions that start at 10 minutes apart and become stronger and more frequent with time; watery or bloody mucus discharge that comes from the vagina; increased pelvic pressure and dull back pain; and leaking of amniotic fluid. This information is not intended to replace advice given to you by your  health care provider. Make sure you discuss any questions you have with your health care provider. Document Released: 02/08/2001 Document Revised: 03/22/2016 Document Reviewed: 03/22/2016 Elsevier Interactive Patient Education  2019 [employer]  

## 2018-05-04 ENCOUNTER — Encounter (HOSPITAL_COMMUNITY): Payer: Self-pay

## 2018-05-04 ENCOUNTER — Ambulatory Visit (HOSPITAL_COMMUNITY)
Admission: RE | Admit: 2018-05-04 | Discharge: 2018-05-04 | Disposition: A | Discharge status: Home / Self Care | Payer: BLUE CROSS/BLUE SHIELD | Source: Ambulatory Visit | Attending: Obstetrics and Gynecology | Admitting: Obstetrics and Gynecology

## 2018-05-04 ENCOUNTER — Ambulatory Visit (HOSPITAL_COMMUNITY): Payer: BLUE CROSS/BLUE SHIELD | Admitting: *Deleted

## 2018-05-04 VITALS — BP 130/86 | HR 73 | Wt 145.2 lb

## 2018-05-04 DIAGNOSIS — O0990 Supervision of high risk pregnancy, unspecified, unspecified trimester: Secondary | ICD-10-CM

## 2018-05-04 DIAGNOSIS — O10912 Unspecified pre-existing hypertension complicating pregnancy, second trimester: Secondary | ICD-10-CM | POA: Diagnosis not present

## 2018-05-04 DIAGNOSIS — O10012 Pre-existing essential hypertension complicating pregnancy, second trimester: Secondary | ICD-10-CM

## 2018-05-04 DIAGNOSIS — O09529 Supervision of elderly multigravida, unspecified trimester: Secondary | ICD-10-CM

## 2018-05-04 DIAGNOSIS — O09522 Supervision of elderly multigravida, second trimester: Secondary | ICD-10-CM

## 2018-05-04 DIAGNOSIS — Z362 Encounter for other antenatal screening follow-up: Secondary | ICD-10-CM

## 2018-05-04 DIAGNOSIS — Z3A27 27 weeks gestation of pregnancy: Secondary | ICD-10-CM

## 2018-05-04 LAB — CBC
Hematocrit: 32.6 % — ABNORMAL LOW (ref 34.0–46.6)
Hemoglobin: 10.9 g/dL — ABNORMAL LOW (ref 11.1–15.9)
MCH: 32.3 pg (ref 26.6–33.0)
MCHC: 33.4 g/dL (ref 31.5–35.7)
MCV: 97 fL (ref 79–97)
Platelets: 237 10*3/uL (ref 150–450)
RBC: 3.37 x10E6/uL — ABNORMAL LOW (ref 3.77–5.28)
RDW: 13.4 % (ref 11.7–15.4)
WBC: 8.5 10*3/uL (ref 3.4–10.8)

## 2018-05-04 LAB — RPR: RPR Ser Ql: NONREACTIVE

## 2018-05-04 LAB — HIV ANTIBODY (ROUTINE TESTING W REFLEX): HIV Screen 4th Generation wRfx: NONREACTIVE

## 2018-05-04 LAB — GLUCOSE TOLERANCE, 2 HOURS W/ 1HR
GLUCOSE, 1 HOUR: 109 mg/dL (ref 65–179)
Glucose, 2 hour: 90 mg/dL (ref 65–152)
Glucose, Fasting: 72 mg/dL (ref 65–91)

## 2018-05-07 ENCOUNTER — Other Ambulatory Visit (HOSPITAL_COMMUNITY): Payer: Self-pay | Admitting: *Deleted

## 2018-05-07 ENCOUNTER — Telehealth: Payer: Self-pay | Admitting: *Deleted

## 2018-05-07 DIAGNOSIS — O10919 Unspecified pre-existing hypertension complicating pregnancy, unspecified trimester: Secondary | ICD-10-CM

## 2018-05-07 MED ORDER — FERROUS SULFATE 325 (65 FE) MG PO TABS: 325.0000 mg | ORAL_TABLET | Freq: Every day | ORAL | 3 refills | Status: DC

## 2018-05-07 NOTE — Telephone Encounter (Addendum)
-----   Message from Hermina Staggers, MD sent at 05/07/2018  9:00 AM EDT ----- Please send in Rx for iron supplement qd Thanks [name]  05/07/18  1740  Called pt and left message stating that her iron level is low. A prescription for daily iron supplement has been sent to her [facility] pharmacy. She may call back if she has questions.

## 2018-05-16 ENCOUNTER — Telehealth: Payer: Self-pay | Admitting: *Deleted

## 2018-05-16 NOTE — Telephone Encounter (Signed)
Attempted to call pt regarding her upcoming appointment. Pt did not pick up.  Detailed voicemail left about new check in process.

## 2018-05-17 ENCOUNTER — Other Ambulatory Visit: Payer: Self-pay

## 2018-05-17 ENCOUNTER — Ambulatory Visit (INDEPENDENT_AMBULATORY_CARE_PROVIDER_SITE_OTHER): Payer: BLUE CROSS/BLUE SHIELD | Admitting: Obstetrics and Gynecology

## 2018-05-17 VITALS — BP 137/89 | HR 88 | Wt 148.6 lb

## 2018-05-17 DIAGNOSIS — O10913 Unspecified pre-existing hypertension complicating pregnancy, third trimester: Secondary | ICD-10-CM

## 2018-05-17 DIAGNOSIS — O10919 Unspecified pre-existing hypertension complicating pregnancy, unspecified trimester: Secondary | ICD-10-CM

## 2018-05-17 DIAGNOSIS — Z3A28 28 weeks gestation of pregnancy: Secondary | ICD-10-CM

## 2018-05-17 DIAGNOSIS — O0990 Supervision of high risk pregnancy, unspecified, unspecified trimester: Secondary | ICD-10-CM

## 2018-05-17 NOTE — Progress Notes (Signed)
Prenatal Visit Note Date: 05/17/2018 Clinic: [facility]  Subjective:  Paige Neal is a 38 y.o. G2P1001 at 103w6d being seen today for ongoing prenatal care.  She is currently monitored for the following issues for this high-risk pregnancy and has Headache; Palpitations; Essential hypertension; Supervision of high risk pregnancy, antepartum; Chronic hypertension in pregnancy; and AMA (advanced maternal age) multigravida 35+, unspecified trimester on their problem list.  Patient reports no complaints.   Contractions: Irritability. Vag. Bleeding: None.  Movement: Present. Denies leaking of fluid.   The following portions of the patient's history were reviewed and updated as appropriate: allergies, current medications, past family history, past medical history, past social history, past surgical history and problem list. Problem list updated.  Objective:   Vitals:   05/17/18 1636  BP: 137/89  Pulse: 88  Weight: 148 lb 9.6 oz (67.4 kg)    Fetal Status: Fetal Heart Rate (bpm): 141   Movement: Present     General:  Alert, oriented and cooperative. Patient is in no acute distress.  Skin: Skin is warm and dry. No rash noted.   Cardiovascular: Normal heart rate noted  Respiratory: Normal respiratory effort, no problems with respiration noted  Abdomen: Soft, gravid, appropriate for gestational age. Pain/Pressure: Absent     Pelvic:  Cervical exam deferred        Extremities: Normal range of motion.  Edema: Trace  Mental Status: Normal mood and affect. Normal behavior. Normal judgment and thought content.   Urinalysis:      Assessment and Plan:  Pregnancy: G2P1001 at [redacted]w[redacted]d  1. Chronic hypertension in pregnancy Continue low dose ASA and labetalol 100/100. Normal growth on 3/6 and has rpt in one month. Start weekly testing at 32wks  2. Supervision of high risk pregnancy, antepartum Routine care. D/w pt more re: BC nv.  Preterm labor symptoms and general  obstetric precautions including but not limited to vaginal bleeding, contractions, leaking of fluid and fetal movement were reviewed in detail with the patient. Please refer to After Visit Summary for other counseling recommendations.  Return in about 2 weeks (around 05/31/2018) for hrob.   Magazine Bing, MD

## 2018-05-30 ENCOUNTER — Telehealth: Payer: Self-pay | Admitting: Family Medicine

## 2018-05-30 NOTE — Telephone Encounter (Signed)
Called pt to see if she is able to retrieve her previous Baby Script OPT In email that was sent on 01/22/2018. Asked if she can check both Spam & Trash, pt states that she does not see it in either. Explained that we have to email [employer] to get them to re-send her another email. Pt also states that she has access to a BP Cuff. Advised will contact her back within 24 to 48hrs to see if she rcvd new email to register. Pt verbalized understanding.

## 2018-05-30 NOTE — Telephone Encounter (Signed)
Spoke with patient about changes in the office. Informed her we would schedule her next appointment in May where she will come into the office to be seen. Then she will do a Webex appointment. I helped her get the app for that. She is not sure if she has baby scripts. She is requesting a call.

## 2018-05-31 ENCOUNTER — Telehealth: Payer: Self-pay

## 2018-05-31 NOTE — Telephone Encounter (Signed)
Called pt to verify if received new Baby Scripts Opt In email & she did & registered and is linked to BP option already. Pt has own Cuff, so will manually input readings. Had pt to walk thru & she verbalized understanding.

## 2018-06-01 ENCOUNTER — Other Ambulatory Visit (HOSPITAL_COMMUNITY): Payer: Self-pay | Admitting: *Deleted

## 2018-06-01 ENCOUNTER — Ambulatory Visit (HOSPITAL_COMMUNITY): Payer: BLUE CROSS/BLUE SHIELD | Admitting: *Deleted

## 2018-06-01 ENCOUNTER — Encounter (HOSPITAL_COMMUNITY): Payer: Self-pay

## 2018-06-01 ENCOUNTER — Ambulatory Visit (HOSPITAL_COMMUNITY)
Admission: RE | Admit: 2018-06-01 | Discharge: 2018-06-01 | Disposition: A | Discharge status: Home / Self Care | Payer: BLUE CROSS/BLUE SHIELD | Source: Ambulatory Visit | Attending: Obstetrics and Gynecology | Admitting: Obstetrics and Gynecology

## 2018-06-01 ENCOUNTER — Other Ambulatory Visit: Payer: Self-pay

## 2018-06-01 VITALS — BP 135/91 | HR 88 | Temp 98.5°F

## 2018-06-01 DIAGNOSIS — O0990 Supervision of high risk pregnancy, unspecified, unspecified trimester: Secondary | ICD-10-CM

## 2018-06-01 DIAGNOSIS — O10919 Unspecified pre-existing hypertension complicating pregnancy, unspecified trimester: Secondary | ICD-10-CM | POA: Insufficient documentation

## 2018-06-01 DIAGNOSIS — O10013 Pre-existing essential hypertension complicating pregnancy, third trimester: Secondary | ICD-10-CM

## 2018-06-01 DIAGNOSIS — Z362 Encounter for other antenatal screening follow-up: Secondary | ICD-10-CM

## 2018-06-01 DIAGNOSIS — O09523 Supervision of elderly multigravida, third trimester: Secondary | ICD-10-CM | POA: Diagnosis not present

## 2018-06-01 DIAGNOSIS — Z3A31 31 weeks gestation of pregnancy: Secondary | ICD-10-CM | POA: Diagnosis not present

## 2018-06-04 ENCOUNTER — Encounter: Payer: BLUE CROSS/BLUE SHIELD | Admitting: Family Medicine

## 2018-06-05 ENCOUNTER — Other Ambulatory Visit: Payer: Self-pay

## 2018-06-05 ENCOUNTER — Inpatient Hospital Stay (HOSPITAL_COMMUNITY)
Admission: AD | Admit: 2018-06-05 | Discharge: 2018-06-05 | Disposition: A | Discharge status: Home / Self Care | Payer: BLUE CROSS/BLUE SHIELD | Attending: Obstetrics & Gynecology | Admitting: Obstetrics & Gynecology

## 2018-06-05 ENCOUNTER — Encounter (HOSPITAL_COMMUNITY): Payer: Self-pay | Admitting: *Deleted

## 2018-06-05 ENCOUNTER — Telehealth: Payer: Self-pay | Admitting: *Deleted

## 2018-06-05 DIAGNOSIS — O10913 Unspecified pre-existing hypertension complicating pregnancy, third trimester: Secondary | ICD-10-CM | POA: Diagnosis not present

## 2018-06-05 DIAGNOSIS — Z7982 Long term (current) use of aspirin: Secondary | ICD-10-CM | POA: Insufficient documentation

## 2018-06-05 DIAGNOSIS — O163 Unspecified maternal hypertension, third trimester: Secondary | ICD-10-CM | POA: Diagnosis not present

## 2018-06-05 DIAGNOSIS — Z3A31 31 weeks gestation of pregnancy: Secondary | ICD-10-CM | POA: Diagnosis not present

## 2018-06-05 DIAGNOSIS — O10919 Unspecified pre-existing hypertension complicating pregnancy, unspecified trimester: Secondary | ICD-10-CM

## 2018-06-05 DIAGNOSIS — O0990 Supervision of high risk pregnancy, unspecified, unspecified trimester: Secondary | ICD-10-CM

## 2018-06-05 DIAGNOSIS — Z79899 Other long term (current) drug therapy: Secondary | ICD-10-CM | POA: Insufficient documentation

## 2018-06-05 LAB — CBC
HCT: 34.5 % — ABNORMAL LOW (ref 36.0–46.0)
Hemoglobin: 11.6 g/dL — ABNORMAL LOW (ref 12.0–15.0)
MCH: 33 pg (ref 26.0–34.0)
MCHC: 33.6 g/dL (ref 30.0–36.0)
MCV: 98 fL (ref 80.0–100.0)
Platelets: 218 10*3/uL (ref 150–400)
RBC: 3.52 MIL/uL — ABNORMAL LOW (ref 3.87–5.11)
RDW: 15.9 % — ABNORMAL HIGH (ref 11.5–15.5)
WBC: 6.5 10*3/uL (ref 4.0–10.5)
nRBC: 0 % (ref 0.0–0.2)

## 2018-06-05 LAB — COMPREHENSIVE METABOLIC PANEL
ALT: 23 U/L (ref 0–44)
AST: 26 U/L (ref 15–41)
Albumin: 2.7 g/dL — ABNORMAL LOW (ref 3.5–5.0)
Alkaline Phosphatase: 97 U/L (ref 38–126)
Anion gap: 8 (ref 5–15)
BUN: 7 mg/dL (ref 6–20)
CO2: 25 mmol/L (ref 22–32)
Calcium: 9.1 mg/dL (ref 8.9–10.3)
Chloride: 105 mmol/L (ref 98–111)
Creatinine, Ser: 0.73 mg/dL (ref 0.44–1.00)
GFR calc Af Amer: >60 mL/min (ref >60)
GFR calc non Af Amer: >60 mL/min (ref >60)
Glucose, Bld: 82 mg/dL (ref 70–99)
Potassium: 4 mmol/L (ref 3.5–5.1)
Sodium: 138 mmol/L (ref 135–145)
Total Bilirubin: 0.4 mg/dL (ref 0.3–1.2)
Total Protein: 6.3 g/dL — ABNORMAL LOW (ref 6.5–8.1)

## 2018-06-05 LAB — URINALYSIS, ROUTINE W REFLEX MICROSCOPIC
Bilirubin Urine: NEGATIVE
Glucose, UA: NEGATIVE mg/dL
Hgb urine dipstick: NEGATIVE
Ketones, ur: NEGATIVE mg/dL
Leukocytes,Ua: NEGATIVE
Nitrite: NEGATIVE
Protein, ur: NEGATIVE mg/dL
Specific Gravity, Urine: 1.02 (ref 1.005–1.030)
pH: 6 (ref 5.0–8.0)

## 2018-06-05 LAB — PROTEIN / CREATININE RATIO, URINE
Creatinine, Urine: 216.01 mg/dL
Protein Creatinine Ratio: 0.14 mg/mg{Cre} (ref 0.00–0.15)
Total Protein, Urine: 31 mg/dL

## 2018-06-05 MED ORDER — NIFEDIPINE 10 MG PO CAPS
10.0000 mg | ORAL_CAPSULE | Freq: Once | ORAL | Status: AC
  Administered 2018-06-05: 17:00:00 10 mg via ORAL
  Filled 2018-06-05: qty 1

## 2018-06-05 MED ORDER — LABETALOL HCL 300 MG PO TABS: 300.0000 mg | ORAL_TABLET | Freq: Two times a day (BID) | ORAL | 1 refills | Status: DC

## 2018-06-05 MED ORDER — LABETALOL HCL 100 MG PO TABS
200.0000 mg | ORAL_TABLET | Freq: Once | ORAL | Status: AC
  Administered 2018-06-05: 200 mg via ORAL
  Filled 2018-06-05: qty 2

## 2018-06-05 NOTE — [facility] Note (Signed)
Presents with c/o elevated BP, took BP @ home.  Denies H/A, visual disturbances, or epigastric pain.  Reports +FM.

## 2018-06-05 NOTE — Telephone Encounter (Signed)
Received a phone call with BP alerts from [employer] .  Reported BP 145/90 and repeat 132/91.also reported abd pain on survey I called Katerria and she states she is taking her labetolol  BID.States she has been off work for a few weeks due to covid 19 and yesterday went to office to get items needed to work from home. States she did not lift anything heavy. Denies headache, denies edema, states baby is moving a lot , maybe a little less yesterday but still a lot. C/o pressure a little more in " crease above thigh". She denies uc's or bleeding or leaking. We discussed pelvic pressure may be normal due to 31 weeks and was more active but if she has increasing pressure or severe pain or bleeding or uc's needs to be assessed. I informed her I will discuss her bp's and assessment with provider and call her back. I discussed with Vonzella Nipple, PA and called patient back and instructed her to go to [facility]U for labwork and evaluation.  I instructed her to wear mask if she has one.  She voices understanding.

## 2018-06-05 NOTE — Discharge Instructions (Signed)
Labetalol tablets What is this medicine? LABETALOL (la BET a lole) is a beta-blocker. Beta-blockers reduce the workload on the heart and help it to beat more regularly. This medicine is used to treat high blood pressure. This medicine may be used for other purposes; ask your health care provider or pharmacist if you have questions. COMMON BRAND NAME(S): Normodyne, Trandate What should I tell my health care provider before I take this medicine? They need to know if you have any of these conditions: -diabetes -history of heart attack, heart disease or heart failure -kidney disease -liver disease -lung or breathing disease, like asthma or emphysema -pheochromocytoma -thyroid disease -an unusual or allergic reaction to labetalol, other beta-blockers, medicines, foods, dyes, or preservatives -pregnant or trying to get pregnant -breast-feeding How should I use this medicine? How to Take Your Blood Pressure Blood pressure is a measurement of how strongly your blood is pressing against the walls of your arteries. Arteries are blood vessels that carry blood from your heart throughout your body. Your health care provider takes your blood pressure at each office visit. You can also take your own blood pressure at home with a blood pressure machine. You may need to take your own blood pressure:  To confirm a diagnosis of high blood pressure (hypertension).  To monitor your blood pressure over time.  To make sure your blood pressure medicine is working. Supplies needed: To take your blood pressure, you will need a blood pressure machine. You can buy a blood pressure machine, or blood pressure monitor, at most drugstores or online. There are several types of home blood pressure monitors. When choosing one, consider the following:  Choose a monitor that has an arm cuff.  Choose a cuff that wraps snugly around your upper arm. You should be able to fit only one finger between your arm and the  cuff.  Do not choose a monitor that measures your blood pressure from your wrist or finger. Your health care provider can suggest a reliable monitor that will meet your needs. How to prepare To get the most accurate reading, avoid the following for 30 minutes before you check your blood pressure:  Drinking caffeine.  Drinking alcohol.  Eating.  Smoking.  Exercising. Five minutes before you check your blood pressure:  Empty your bladder.  Sit quietly without talking in a dining chair, rather than in a soft couch or armchair. How to take your blood pressure To check your blood pressure, follow the instructions in the manual that came with your blood pressure monitor. If you have a digital blood pressure monitor, the instructions may be as follows: 1. Sit up straight. 2. Place your feet on the floor. Do not cross your ankles or legs. 3. Rest your left arm at the level of your heart on a table or desk or on the arm of a chair. 4. Pull up your shirt sleeve. 5. Wrap the blood pressure cuff around the upper part of your left arm, 1 inch (2.5 cm) above your elbow. It is best to wrap the cuff around bare skin. 6. Fit the cuff snugly around your arm. You should be able to place only one finger between the cuff and your arm. 7. Position the cord inside the groove of your elbow. 8. Press the power button. 9. Sit quietly while the cuff inflates and deflates. 10. Read the digital reading on the monitor screen and write it down (record it). 11. Wait 2-3 minutes, then repeat the steps, starting at step  1. What does my blood pressure reading mean? A blood pressure reading consists of a higher number over a lower number. Ideally, your blood pressure should be below 120/80. The first ("top") number is called the systolic pressure. It is a measure of the pressure in your arteries as your heart beats. The second ("bottom") number is called the diastolic pressure. It is a measure of the pressure in your  arteries as the heart relaxes. Blood pressure is classified into four stages. The following are the stages for adults who do not have a short-term serious illness or a chronic condition. Systolic pressure and diastolic pressure are measured in a unit called mm Hg. Normal  Systolic pressure: below 120.  Diastolic pressure: below 80. Elevated  Systolic pressure: 120-129.  Diastolic pressure: below 80. Hypertension stage 1  Systolic pressure: 130-139.  Diastolic pressure: 80-89. Hypertension stage 2  Systolic pressure: 140 or above.  Diastolic pressure: 90 or above. You can have prehypertension or hypertension even if only the systolic or only the diastolic number in your reading is higher than normal. Follow these instructions at home:  Check your blood pressure as often as recommended by your health care provider.  Take your monitor to the next appointment with your health care provider to make sure: ? That you are using it correctly. ? That it provides accurate readings.  Be sure you understand what your goal blood pressure numbers are.  Tell your health care provider if you are having any side effects from blood pressure medicine. Contact a health care provider if:  Your blood pressure is consistently high. Get help right away if:  Your systolic blood pressure is higher than 180.  Your diastolic blood pressure is higher than 110. This information is not intended to replace advice given to you by your health care provider. Make sure you discuss any questions you have with your health care provider. Document Released: 07/24/2015 Document Revised: 12/27/2016 Document Reviewed: 07/24/2015 Elsevier Interactive Patient Education  2019 [employer]  Take this medicine by mouth with a glass of water. Follow the directions on the prescription label. Take your doses at regular intervals. Do not take your medicine more often than directed. Do not stop taking this medicine  suddenly. This could lead to serious heart-related effects. Talk to your pediatrician regarding the use of this medicine in children. Special care may be needed. Overdosage: If you think you have taken too much of this medicine contact a poison control center or emergency room at once. NOTE: This medicine is only for you. Do not share this medicine with others. What if I miss a dose? If you miss a dose, take it as soon as you can. If it is almost time for your next dose, take only that dose. Do not take double or extra doses. What may interact with this medicine? This medicine may interact with the following medications: -certain medicines for blood pressure, heart disease, irregular heart beat -cimetidine -general anesthetics -medicines for asthma or lung disease like albuterol -medicines for depression -nitroglycerin This list may not describe all possible interactions. Give your health care provider a list of all the medicines, herbs, non-prescription drugs, or dietary supplements you use. Also tell them if you smoke, drink alcohol, or use illegal drugs. Some items may interact with your medicine. What should I watch for while using this medicine? Visit your doctor or health care professional for regular check ups. Check your blood pressure and pulse rate regularly. Ask your health care  professional what your blood pressure and pulse rate should be, and when you should contact him or her. You may get drowsy or dizzy. Do not drive, use machinery, or do anything that needs mental alertness until you know how this medicine affects you. Do not stand or sit up quickly. Alcohol may interfere with the effect of this medicine. Avoid alcoholic drinks. This medicine can affect blood sugar levels. If you have diabetes, check with your doctor or health care professional before you change your diet or the dose of your diabetic medicine. Do not treat yourself for coughs, colds, or pain while you are taking  this medicine without asking your doctor or health care professional for advice. Some ingredients may increase your blood pressure. What side effects may I notice from receiving this medicine? Side effects that you should report to your doctor or health care professional as soon as possible: -allergic reactions like skin rash, itching or hives, swelling of the face, lips, or tongue -breathing problems -cold hands or feet -dark urine -depression -general ill feeling or flu-like symptoms -irregular heartbeat -light-colored stools -loss of appetite, nausea -pain or trouble passing urine -right upper belly pain -slow heart rate (fewer than recommended by your doctor or health care professional) -swollen legs or ankles -tingling of the scalp or skin -unusually weak or tired -vomiting -yellowing of the eyes or skin Side effects that usually do not require medical attention (report to your doctor or health care professional if they continue or are bothersome): -decreased sexual function or desire -dry itching skin -headache -tiredness This list may not describe all possible side effects. Call your doctor for medical advice about side effects. You may report side effects to FDA at 1-800-FDA-1088. Where should I keep my medicine? Keep out of the reach of children. Store at room temperature between 15 and 30 degrees C (59 and 86 degrees F). Protect from light. Keep container tightly closed. Throw away any unused medicine after the expiration date. NOTE: This sheet is a summary. It may not cover all possible information. If you have questions about this medicine, talk to your doctor, pharmacist, or health care provider.  2019 Elsevier/Gold Standard (2017-01-20 09:40:56)

## 2018-06-05 NOTE — MAU Provider Note (Signed)
History     CSN: [id number]  Arrival date and time: 06/05/18 1345   First Provider Initiated Contact with Patient 06/05/18 1448      Chief Complaint  Patient presents with  . BP Evaluation   Paige RepressChristina Neal is a 38 y.o. G2P1001 at 6373w4d who presents for BP Evaluation.  Patient was contacted from office and instructed to report to MAU for evaluation s/p elevated blood pressures noted via baby scripts.  Patient denies HA, visual disturbances, SOB, and epigastric pain.  She reports fetal movement and irregular contractions, but denies vaginal concerns including LOF, VB, and discharge.  She states she has been complaint with her labetalol dosing and takes "the lowest dose" twice daily.  She also reports taking her baby aspirin daily.  Patient states she "tries to take" her blood pressure once a day.     OB History    Gravida  2   Para  1   Term  1   Preterm  0   AB  0   Living  1     SAB      TAB      Ectopic      Multiple      Live Births  1           Past Medical History:  Diagnosis Date  . HTN (hypertension)   . Hyperemesis gravidarum   . Panic attack   . Tachycardia     Past Surgical History:  Procedure Laterality Date  . BREAST FIBROADENOMA SURGERY    . gardisil x 3 - 08/30/2005      Family History  Problem Relation Age of Onset  . Coronary artery disease Mother   . Hypertension Mother   . Hypertension Father   . Hypertension Sister     Social History   Tobacco Use  . Smoking status: Never Smoker  . Smokeless tobacco: Never Used  Substance Use Topics  . Alcohol use: No  . Drug use: No    Allergies: No Known Allergies  Medications Prior to Admission  Medication Sig Dispense Refill Last Dose  . aspirin EC 81 MG tablet Take 1 tablet (81 mg total) by mouth daily. Take after 12 weeks for prevention of preeclampsia later in pregnancy 300 tablet 2 06/04/2018 at Unknown time  . ferrous sulfate 325 (65 FE) MG tablet Take 1 tablet (325 mg  total) by mouth daily with breakfast. 30 tablet 3 06/05/2018 at Unknown time  . labetalol (NORMODYNE) 100 MG tablet Take 1 tablet (100 mg total) by mouth 2 (two) times daily. 180 tablet 2 06/05/2018 at Unknown time  . prenatal vitamin w/FE, FA (PRENATAL 1 + 1) 27-1 MG TABS tablet Take 1 tablet by mouth daily at 12 noon.   06/05/2018 at Unknown time  . fluticasone (FLONASE) 50 MCG/ACT nasal spray Place into both nostrils daily.   Taking    Review of Systems  Constitutional: Negative for chills and fever.  Eyes: Negative for photophobia and visual disturbance.  Respiratory: Negative for cough and shortness of breath.   Cardiovascular: Negative for chest pain.  Gastrointestinal: Negative for abdominal pain, constipation, diarrhea, nausea and vomiting.  Genitourinary: Negative for vaginal bleeding and vaginal discharge.  Neurological: Negative for dizziness, light-headedness and headaches.   Physical Exam   Temperature 98.2 F (36.8 C), temperature source Oral, resp. rate 20, height 5\' 6"  (1.676 m), weight 66.8 kg, last menstrual period 10/27/2017, SpO2 99 %.  Vitals:   06/05/18 1445 06/05/18 1500 06/05/18  1515 06/05/18 1530  BP: (!) 150/94 (!) 153/97 (!) 149/97 (!) 157/103  Pulse: 67 65 68 68  Resp:      Temp:      TempSrc:      SpO2:      Weight:      Height:         Physical Exam  Constitutional: She is oriented to person, place, and time. She appears well-developed and well-nourished.  HENT:  Head: Normocephalic and atraumatic.  Eyes: Conjunctivae are normal.  Neck: Normal range of motion.  Cardiovascular: Normal rate.  Respiratory: Effort normal.  GI: Soft.  Musculoskeletal: Normal range of motion.  Neurological: She is alert and oriented to person, place, and time.  Skin: Skin is warm and dry.  Psychiatric: She has a normal mood and affect. Her behavior is normal.    Fetal Assessment 130 bpm, Mod Var, -Decels, +Accels Toco: Occassional  MAU Course   Results for  orders placed or performed during the hospital encounter of 06/05/18 (from the past 24 hour(s))  Protein / creatinine ratio, urine     Status: None   Collection Time: 06/05/18  2:12 PM  Result Value Ref Range   Creatinine, Urine 216.01 mg/dL   Total Protein, Urine 31 mg/dL   Protein Creatinine Ratio 0.14 0.00 - 0.15 mg/mg[Cre]  Urinalysis, Routine w reflex microscopic     Status: None   Collection Time: 06/05/18  2:15 PM  Result Value Ref Range   Color, Urine YELLOW YELLOW   APPearance CLEAR CLEAR   Specific Gravity, Urine 1.020 1.005 - 1.030   pH 6.0 5.0 - 8.0   Glucose, UA NEGATIVE NEGATIVE mg/dL   Hgb urine dipstick NEGATIVE NEGATIVE   Bilirubin Urine NEGATIVE NEGATIVE   Ketones, ur NEGATIVE NEGATIVE mg/dL   Protein, ur NEGATIVE NEGATIVE mg/dL   Nitrite NEGATIVE NEGATIVE   Leukocytes,Ua NEGATIVE NEGATIVE  CBC     Status: Abnormal   Collection Time: 06/05/18  2:55 PM  Result Value Ref Range   WBC 6.5 4.0 - 10.5 K/uL   RBC 3.52 (L) 3.87 - 5.11 MIL/uL   Hemoglobin 11.6 (L) 12.0 - 15.0 g/dL   HCT 16.1 (L) 09.6 - 04.5 %   MCV 98.0 80.0 - 100.0 fL   MCH 33.0 26.0 - 34.0 pg   MCHC 33.6 30.0 - 36.0 g/dL   RDW 40.9 (H) 81.1 - 91.4 %   Platelets 218 150 - 400 K/uL   nRBC 0.0 0.0 - 0.2 %  Comprehensive metabolic panel     Status: Abnormal   Collection Time: 06/05/18  2:55 PM  Result Value Ref Range   Sodium 138 135 - 145 mmol/L   Potassium 4.0 3.5 - 5.1 mmol/L   Chloride 105 98 - 111 mmol/L   CO2 25 22 - 32 mmol/L   Glucose, Bld 82 70 - 99 mg/dL   BUN 7 6 - 20 mg/dL   Creatinine, Ser 7.82 0.44 - 1.00 mg/dL   Calcium 9.1 8.9 - 95.6 mg/dL   Total Protein 6.3 (L) 6.5 - 8.1 g/dL   Albumin 2.7 (L) 3.5 - 5.0 g/dL   AST 26 15 - 41 U/L   ALT 23 0 - 44 U/L   Alkaline Phosphatase 97 38 - 126 U/L   Total Bilirubin 0.4 0.3 - 1.2 mg/dL   GFR calc non Af Amer >60 >60 mL/min   GFR calc Af Amer >60 >60 mL/min   Anion gap 8 5 - 15   No  results found.  MDM PE Labs:CBC, CMP, PC  Ratio EFM  Assessment and Plan  38 year old G2P1001 at 31.4 weeks Cat I FT CHTN  -Exam findings discussed -PIH labs sent -Will give 200mg  of Labetalol now. -Informed of need to increase labetalol dosing daily. -Educated on taking blood pressure at least twice daily.  Instructed to take 2 hours after dosing. -Patient expresses concern with remembering, reassurances given that instructions would be provided.   Follow Up (3:34 PM)  -Elevated BP noted -Labetalol dosing not yet given.  -Nurse instructed to give now, will monitor.  Follow Up (4:32 PM)  Vitals:   06/05/18 1530 06/05/18 1545 06/05/18 1600 06/05/18 1615  BP: (!) 157/103 (!) 155/100 (!) 165/101 (!) 158/93  Pulse: 68 62 63 63  Resp:      Temp:      TempSrc:      SpO2:      Weight:      Height:        -Patient questions when she will be discharged. -Discussed bp trends and need for additional checks. -Informed of lab results. -patient reports HA of 3/10, but declines pain medication stating it is manageable. -Requests something to eat. -Dr. Adrian Blackwater consulted regarding patient blood pressure and advises: *Give 10mg  Procardia now and reassess *Increase labetalol to 300mg  BID -Will obtain 2-3 more blood pressures and if in good range will discharge.   Follow Up (5:56 PM)  Vitals:   06/05/18 1700 06/05/18 1715 06/05/18 1730 06/05/18 1745  BP: (!) 155/106 (!) 157/96 (!) 146/93 130/82  Pulse: 77 63 71 96  Resp:      Temp:      TempSrc:      SpO2:      Weight:      Height:       -Blood pressures improved since Procardia dosing -Discussed change in labetalol dosing to 300mg  BID. -Reiterated need to check blood pressures twice daily after dosing and report to babyscripts. -Labor Precautions given. -No other questions or concerns. -Encouraged to call or return to MAU if symptoms worsen or with the onset of new symptoms. -Discharged to home in stable condition  Cherre Robins MSN, CNM 06/05/2018, 2:48 PM

## 2018-06-14 ENCOUNTER — Ambulatory Visit (HOSPITAL_COMMUNITY): Payer: BLUE CROSS/BLUE SHIELD

## 2018-06-14 ENCOUNTER — Encounter (HOSPITAL_COMMUNITY): Payer: Self-pay

## 2018-06-14 ENCOUNTER — Ambulatory Visit (HOSPITAL_COMMUNITY): Payer: BLUE CROSS/BLUE SHIELD | Admitting: *Deleted

## 2018-06-14 ENCOUNTER — Other Ambulatory Visit: Payer: Self-pay

## 2018-06-14 ENCOUNTER — Ambulatory Visit (HOSPITAL_COMMUNITY)
Admission: RE | Admit: 2018-06-14 | Discharge: 2018-06-14 | Disposition: A | Discharge status: Home / Self Care | Payer: BLUE CROSS/BLUE SHIELD | Source: Ambulatory Visit | Attending: Obstetrics and Gynecology | Admitting: Obstetrics and Gynecology

## 2018-06-14 VITALS — BP 142/90 | HR 89 | Temp 98.3°F

## 2018-06-14 DIAGNOSIS — O0990 Supervision of high risk pregnancy, unspecified, unspecified trimester: Secondary | ICD-10-CM | POA: Diagnosis present

## 2018-06-14 DIAGNOSIS — O289 Unspecified abnormal findings on antenatal screening of mother: Secondary | ICD-10-CM | POA: Diagnosis not present

## 2018-06-14 DIAGNOSIS — O10919 Unspecified pre-existing hypertension complicating pregnancy, unspecified trimester: Secondary | ICD-10-CM | POA: Insufficient documentation

## 2018-06-14 DIAGNOSIS — Z3A32 32 weeks gestation of pregnancy: Secondary | ICD-10-CM

## 2018-06-14 DIAGNOSIS — O10013 Pre-existing essential hypertension complicating pregnancy, third trimester: Secondary | ICD-10-CM | POA: Diagnosis not present

## 2018-06-14 DIAGNOSIS — O09523 Supervision of elderly multigravida, third trimester: Secondary | ICD-10-CM

## 2018-06-18 ENCOUNTER — Encounter: Payer: Self-pay | Admitting: General Practice

## 2018-06-18 ENCOUNTER — Telehealth: Payer: Self-pay | Admitting: General Practice

## 2018-06-18 NOTE — Telephone Encounter (Signed)
Received alert via Babyscripts for elevated BP 4/18 of 134/94. Called patient and asked her to check her BP this morning while on the phone. Patient reports BP of 135/92. Asked patient when she took her BP medication. Patient states around 8am. Patient denies headaches, dizziness, or blurry vision. Asked patient to recheck BP in 5 minutes and upload into babyscripts. Patient verbalized understanding & had no questions. Per Babyscripts review, repeat BP is 126/87. Reviewed with Dr Adrian Blackwater who agreed patient can continue to monitor at home as previously discussed without intervention at this time. Will send patient mychart message.

## 2018-06-21 ENCOUNTER — Other Ambulatory Visit: Payer: Self-pay

## 2018-06-21 ENCOUNTER — Encounter (HOSPITAL_COMMUNITY): Payer: Self-pay

## 2018-06-21 ENCOUNTER — Ambulatory Visit (HOSPITAL_COMMUNITY): Payer: BLUE CROSS/BLUE SHIELD | Admitting: *Deleted

## 2018-06-21 ENCOUNTER — Ambulatory Visit (HOSPITAL_COMMUNITY)
Admission: RE | Admit: 2018-06-21 | Discharge: 2018-06-21 | Disposition: A | Discharge status: Home / Self Care | Payer: BLUE CROSS/BLUE SHIELD | Source: Ambulatory Visit | Attending: Obstetrics and Gynecology | Admitting: Obstetrics and Gynecology

## 2018-06-21 VITALS — BP 130/94 | HR 75 | Temp 98.8°F

## 2018-06-21 DIAGNOSIS — O10013 Pre-existing essential hypertension complicating pregnancy, third trimester: Secondary | ICD-10-CM | POA: Diagnosis not present

## 2018-06-21 DIAGNOSIS — O0990 Supervision of high risk pregnancy, unspecified, unspecified trimester: Secondary | ICD-10-CM | POA: Insufficient documentation

## 2018-06-21 DIAGNOSIS — O09523 Supervision of elderly multigravida, third trimester: Secondary | ICD-10-CM | POA: Diagnosis not present

## 2018-06-21 DIAGNOSIS — O10919 Unspecified pre-existing hypertension complicating pregnancy, unspecified trimester: Secondary | ICD-10-CM

## 2018-06-21 DIAGNOSIS — O289 Unspecified abnormal findings on antenatal screening of mother: Secondary | ICD-10-CM

## 2018-06-21 DIAGNOSIS — Z3A33 33 weeks gestation of pregnancy: Secondary | ICD-10-CM

## 2018-06-27 ENCOUNTER — Other Ambulatory Visit: Payer: Self-pay

## 2018-06-27 ENCOUNTER — Ambulatory Visit (INDEPENDENT_AMBULATORY_CARE_PROVIDER_SITE_OTHER): Payer: BLUE CROSS/BLUE SHIELD | Admitting: Obstetrics and Gynecology

## 2018-06-27 ENCOUNTER — Encounter: Payer: Self-pay | Admitting: Obstetrics and Gynecology

## 2018-06-27 DIAGNOSIS — O0990 Supervision of high risk pregnancy, unspecified, unspecified trimester: Secondary | ICD-10-CM

## 2018-06-27 DIAGNOSIS — Z3A34 34 weeks gestation of pregnancy: Secondary | ICD-10-CM

## 2018-06-27 DIAGNOSIS — O10913 Unspecified pre-existing hypertension complicating pregnancy, third trimester: Secondary | ICD-10-CM

## 2018-06-27 DIAGNOSIS — O09529 Supervision of elderly multigravida, unspecified trimester: Secondary | ICD-10-CM

## 2018-06-27 DIAGNOSIS — O0993 Supervision of high risk pregnancy, unspecified, third trimester: Secondary | ICD-10-CM

## 2018-06-27 DIAGNOSIS — O10919 Unspecified pre-existing hypertension complicating pregnancy, unspecified trimester: Secondary | ICD-10-CM

## 2018-06-27 DIAGNOSIS — O09523 Supervision of elderly multigravida, third trimester: Secondary | ICD-10-CM

## 2018-06-27 NOTE — Progress Notes (Signed)
   PRENATAL VISIT NOTE TELEHEALTH VIRTUAL OBSTETRICS VISIT ENCOUNTER NOTE  I connected with Paige Neal on 06/27/18 at 10:15 AM EDT by Webex at home and verified that I am speaking with the correct person using two identifiers.   I discussed the limitations, risks, security and privacy concerns of performing an evaluation and management service by telephone and the availability of in person appointments. I also discussed with the patient that there may be a patient responsible charge related to this service. The patient expressed understanding and agreed to proceed. Subjective:  Paige Neal is a 38 y.o. G2P1001 at [redacted]w[redacted]d being seen today for ongoing prenatal care.  She is currently monitored for the following issues for this high-risk pregnancy and has Headache; Palpitations; Essential hypertension; Supervision of high risk pregnancy, antepartum; Chronic hypertension in pregnancy; and AMA (advanced maternal age) multigravida 35+, unspecified trimester on their problem list.  Patient reports no complaints.  Reports fetal movement. Contractions: Irritability. Vag. Bleeding: None.  Movement: Present. Denies any contractions, bleeding or leaking of fluid.   The following portions of the patient's history were reviewed and updated as appropriate: allergies, current medications, past family history, past medical history, past social history, past surgical history and problem list.   Objective:  There were no vitals filed for this visit.  Fetal Status:     Movement: Present     General:  Alert, oriented and cooperative. Patient is in no acute distress.  Respiratory: Normal respiratory effort, no problems with respiration noted  Mental Status: Normal mood and affect. Normal behavior. Normal judgment and thought content.  Rest of physical exam deferred due to type of encounter  Assessment and Plan:  Pregnancy: G2P1001 at [redacted]w[redacted]d  1. Chronic hypertension in pregnancy Cont labetalol 300  mg BID Per pt 132/88 Cont baby ASA  2. Supervision of high risk pregnancy, antepartum Undecided regarding contraception, had extended conversation counseling about options, she will consider, sent info via mychart  3. AMA (advanced maternal age) multigravida 35+, unspecified trimester   Preterm labor symptoms and general obstetric precautions including but not limited to vaginal bleeding, contractions, leaking of fluid and fetal movement were reviewed in detail with the patient. I discussed the assessment and treatment plan with the patient. The patient was provided an opportunity to ask questions and all were answered. The patient agreed with the plan and demonstrated an understanding of the instructions. The patient was advised to call back or seek an in-person office evaluation/go to [facility]U at [facility] for any urgent or concerning symptoms. Please refer to After Visit Summary for other counseling recommendations.   I provided 20 minutes of non-face-to-face time during this encounter. Return in about 2 weeks (around 07/11/2018) for OB visit (MD).  Future Appointments  Date Time Provider Department Center  06/28/2018  3:30 PM [facility] NURSE [facility]  06/28/2018  3:30 PM [facility] [place] [facility]    Conan Bowens, MD Center for [facility], [facility]

## 2018-06-28 ENCOUNTER — Other Ambulatory Visit: Payer: Self-pay

## 2018-06-28 ENCOUNTER — Encounter (HOSPITAL_COMMUNITY): Payer: Self-pay | Admitting: *Deleted

## 2018-06-28 ENCOUNTER — Ambulatory Visit (HOSPITAL_COMMUNITY): Payer: BLUE CROSS/BLUE SHIELD | Admitting: *Deleted

## 2018-06-28 ENCOUNTER — Ambulatory Visit (HOSPITAL_COMMUNITY)
Admission: RE | Admit: 2018-06-28 | Discharge: 2018-06-28 | Disposition: A | Discharge status: Home / Self Care | Payer: BLUE CROSS/BLUE SHIELD | Source: Ambulatory Visit | Attending: Obstetrics and Gynecology | Admitting: Obstetrics and Gynecology

## 2018-06-28 DIAGNOSIS — Z362 Encounter for other antenatal screening follow-up: Secondary | ICD-10-CM | POA: Diagnosis not present

## 2018-06-28 DIAGNOSIS — O0990 Supervision of high risk pregnancy, unspecified, unspecified trimester: Secondary | ICD-10-CM | POA: Insufficient documentation

## 2018-06-28 DIAGNOSIS — O10919 Unspecified pre-existing hypertension complicating pregnancy, unspecified trimester: Secondary | ICD-10-CM | POA: Diagnosis not present

## 2018-06-28 DIAGNOSIS — O09523 Supervision of elderly multigravida, third trimester: Secondary | ICD-10-CM | POA: Diagnosis not present

## 2018-06-28 DIAGNOSIS — O289 Unspecified abnormal findings on antenatal screening of mother: Secondary | ICD-10-CM | POA: Diagnosis not present

## 2018-06-28 DIAGNOSIS — O10013 Pre-existing essential hypertension complicating pregnancy, third trimester: Secondary | ICD-10-CM

## 2018-06-28 DIAGNOSIS — Z3A34 34 weeks gestation of pregnancy: Secondary | ICD-10-CM

## 2018-06-29 ENCOUNTER — Other Ambulatory Visit (HOSPITAL_COMMUNITY): Payer: Self-pay | Admitting: *Deleted

## 2018-06-29 DIAGNOSIS — O09523 Supervision of elderly multigravida, third trimester: Secondary | ICD-10-CM

## 2018-07-04 ENCOUNTER — Ambulatory Visit (HOSPITAL_COMMUNITY): Payer: BLUE CROSS/BLUE SHIELD | Admitting: *Deleted

## 2018-07-04 ENCOUNTER — Encounter (HOSPITAL_COMMUNITY): Payer: Self-pay

## 2018-07-04 ENCOUNTER — Ambulatory Visit (HOSPITAL_COMMUNITY)
Admission: RE | Admit: 2018-07-04 | Discharge: 2018-07-04 | Disposition: A | Discharge status: Home / Self Care | Payer: BLUE CROSS/BLUE SHIELD | Source: Ambulatory Visit | Attending: Obstetrics and Gynecology | Admitting: Obstetrics and Gynecology

## 2018-07-04 ENCOUNTER — Other Ambulatory Visit: Payer: Self-pay

## 2018-07-04 VITALS — BP 142/91 | HR 74 | Temp 98.4°F

## 2018-07-04 DIAGNOSIS — Z3A35 35 weeks gestation of pregnancy: Secondary | ICD-10-CM

## 2018-07-04 DIAGNOSIS — O289 Unspecified abnormal findings on antenatal screening of mother: Secondary | ICD-10-CM | POA: Diagnosis not present

## 2018-07-04 DIAGNOSIS — O09523 Supervision of elderly multigravida, third trimester: Secondary | ICD-10-CM

## 2018-07-04 DIAGNOSIS — O359XX0 Maternal care for (suspected) fetal abnormality and damage, unspecified, not applicable or unspecified: Secondary | ICD-10-CM | POA: Diagnosis present

## 2018-07-04 DIAGNOSIS — O10013 Pre-existing essential hypertension complicating pregnancy, third trimester: Secondary | ICD-10-CM

## 2018-07-06 ENCOUNTER — Other Ambulatory Visit: Payer: Self-pay

## 2018-07-06 ENCOUNTER — Encounter (HOSPITAL_COMMUNITY): Payer: Self-pay | Admitting: *Deleted

## 2018-07-06 ENCOUNTER — Inpatient Hospital Stay (HOSPITAL_COMMUNITY): Payer: BLUE CROSS/BLUE SHIELD | Admitting: Anesthesiology

## 2018-07-06 ENCOUNTER — Inpatient Hospital Stay (HOSPITAL_COMMUNITY)
Admission: AD | Admit: 2018-07-06 | Discharge: 2018-07-10 | DRG: 807 | Disposition: A | Discharge status: Home / Self Care | Payer: BLUE CROSS/BLUE SHIELD | Attending: Obstetrics and Gynecology | Admitting: Obstetrics and Gynecology

## 2018-07-06 ENCOUNTER — Telehealth (INDEPENDENT_AMBULATORY_CARE_PROVIDER_SITE_OTHER): Payer: BLUE CROSS/BLUE SHIELD | Admitting: *Deleted

## 2018-07-06 DIAGNOSIS — O10919 Unspecified pre-existing hypertension complicating pregnancy, unspecified trimester: Secondary | ICD-10-CM

## 2018-07-06 DIAGNOSIS — R51 Headache: Secondary | ICD-10-CM

## 2018-07-06 DIAGNOSIS — Z3A36 36 weeks gestation of pregnancy: Secondary | ICD-10-CM

## 2018-07-06 DIAGNOSIS — O1002 Pre-existing essential hypertension complicating childbirth: Secondary | ICD-10-CM | POA: Diagnosis present

## 2018-07-06 DIAGNOSIS — O114 Pre-existing hypertension with pre-eclampsia, complicating childbirth: Secondary | ICD-10-CM | POA: Diagnosis present

## 2018-07-06 DIAGNOSIS — O09529 Supervision of elderly multigravida, unspecified trimester: Secondary | ICD-10-CM

## 2018-07-06 DIAGNOSIS — R519 Headache, unspecified: Secondary | ICD-10-CM

## 2018-07-06 DIAGNOSIS — O1414 Severe pre-eclampsia complicating childbirth: Secondary | ICD-10-CM | POA: Diagnosis not present

## 2018-07-06 DIAGNOSIS — O0990 Supervision of high risk pregnancy, unspecified, unspecified trimester: Secondary | ICD-10-CM

## 2018-07-06 DIAGNOSIS — R030 Elevated blood-pressure reading, without diagnosis of hypertension: Secondary | ICD-10-CM | POA: Diagnosis not present

## 2018-07-06 DIAGNOSIS — O10913 Unspecified pre-existing hypertension complicating pregnancy, third trimester: Secondary | ICD-10-CM

## 2018-07-06 DIAGNOSIS — O119 Pre-existing hypertension with pre-eclampsia, unspecified trimester: Secondary | ICD-10-CM | POA: Diagnosis present

## 2018-07-06 LAB — COMPREHENSIVE METABOLIC PANEL
ALT: 23 U/L (ref 0–44)
AST: 25 U/L (ref 15–41)
Albumin: 2.7 g/dL — ABNORMAL LOW (ref 3.5–5.0)
Alkaline Phosphatase: 116 U/L (ref 38–126)
Anion gap: 12 (ref 5–15)
BUN: 10 mg/dL (ref 6–20)
CO2: 23 mmol/L (ref 22–32)
Calcium: 9 mg/dL (ref 8.9–10.3)
Chloride: 102 mmol/L (ref 98–111)
Creatinine, Ser: 0.78 mg/dL (ref 0.44–1.00)
GFR calc Af Amer: >60 mL/min (ref >60)
GFR calc non Af Amer: >60 mL/min (ref >60)
Glucose, Bld: 89 mg/dL (ref 70–99)
Potassium: 4.2 mmol/L (ref 3.5–5.1)
Sodium: 137 mmol/L (ref 135–145)
Total Bilirubin: 0.4 mg/dL (ref 0.3–1.2)
Total Protein: 6 g/dL — ABNORMAL LOW (ref 6.5–8.1)

## 2018-07-06 LAB — TYPE AND SCREEN
ABO/RH(D): A POS
Antibody Screen: NEGATIVE

## 2018-07-06 LAB — PROTEIN / CREATININE RATIO, URINE
Creatinine, Urine: 242.7 mg/dL
Protein Creatinine Ratio: 0.24 mg/mg{Cre} — ABNORMAL HIGH (ref 0.00–0.15)
Total Protein, Urine: 58 mg/dL

## 2018-07-06 LAB — CBC
HCT: 36.2 % (ref 36.0–46.0)
Hemoglobin: 12.2 g/dL (ref 12.0–15.0)
MCH: 33.7 pg (ref 26.0–34.0)
MCHC: 33.7 g/dL (ref 30.0–36.0)
MCV: 100 fL (ref 80.0–100.0)
Platelets: 156 10*3/uL (ref 150–400)
RBC: 3.62 MIL/uL — ABNORMAL LOW (ref 3.87–5.11)
RDW: 15.5 % (ref 11.5–15.5)
WBC: 5.9 10*3/uL (ref 4.0–10.5)
nRBC: 0 % (ref 0.0–0.2)

## 2018-07-06 LAB — ABO/RH: ABO/RH(D): A POS

## 2018-07-06 MED ORDER — HYDRALAZINE HCL 20 MG/ML IJ SOLN
5.0000 mg | INTRAMUSCULAR | Status: DC | PRN
  Administered 2018-07-06 – 2018-07-07 (×2): 5 mg via INTRAVENOUS
  Filled 2018-07-06 (×2): qty 1

## 2018-07-06 MED ORDER — HYDROXYZINE HCL 50 MG PO TABS: 25.0000 mg | ORAL_TABLET | Freq: Three times a day (TID) | ORAL | Status: DC | PRN

## 2018-07-06 MED ORDER — MAGNESIUM SULFATE 40 G IN LACTATED RINGERS - SIMPLE: 2.0000 g/h | Freq: Once | INTRAVENOUS | Status: DC

## 2018-07-06 MED ORDER — ONDANSETRON HCL 4 MG/2ML IJ SOLN
4.0000 mg | Freq: Four times a day (QID) | INTRAMUSCULAR | Status: DC | PRN
  Administered 2018-07-07 – 2018-07-08 (×3): 4 mg via INTRAVENOUS
  Filled 2018-07-06 (×4): qty 2

## 2018-07-06 MED ORDER — ACETAMINOPHEN 325 MG PO TABS
650.0000 mg | ORAL_TABLET | ORAL | Status: DC | PRN
  Administered 2018-07-07 (×2): 650 mg via ORAL
  Filled 2018-07-06 (×3): qty 2

## 2018-07-06 MED ORDER — HYDRALAZINE HCL 20 MG/ML IJ SOLN
10.0000 mg | INTRAMUSCULAR | Status: DC | PRN
  Administered 2018-07-06: 10 mg via INTRAVENOUS

## 2018-07-06 MED ORDER — MAGNESIUM SULFATE 4 GM/100ML IV SOLN: 4.0000 g | Freq: Once | INTRAVENOUS | Status: DC

## 2018-07-06 MED ORDER — LACTATED RINGERS IV SOLN: 500.0000 mL | INTRAVENOUS | Status: DC | PRN

## 2018-07-06 MED ORDER — SODIUM CHLORIDE 0.9 % IV SOLN
5.0000 10*6.[IU] | Freq: Once | INTRAVENOUS | Status: AC
  Administered 2018-07-06: 5 10*6.[IU] via INTRAVENOUS
  Filled 2018-07-06: qty 5

## 2018-07-06 MED ORDER — LACTATED RINGERS IV SOLN
INTRAVENOUS | Status: DC
  Administered 2018-07-06: 16:00:00 via INTRAVENOUS

## 2018-07-06 MED ORDER — PENICILLIN G 3 MILLION UNITS IVPB - SIMPLE MED
3.0000 10*6.[IU] | INTRAVENOUS | Status: DC
  Administered 2018-07-06 – 2018-07-08 (×8): 3 10*6.[IU] via INTRAVENOUS
  Filled 2018-07-06 (×8): qty 100

## 2018-07-06 MED ORDER — MAGNESIUM SULFATE 40 G IN LACTATED RINGERS - SIMPLE
2.0000 g/h | INTRAVENOUS | Status: DC
  Administered 2018-07-07 – 2018-07-08 (×2): 2 g/h via INTRAVENOUS
  Filled 2018-07-06 (×3): qty 500

## 2018-07-06 MED ORDER — SOD CITRATE-CITRIC ACID 500-334 MG/5ML PO SOLN: 30.0000 mL | ORAL | Status: DC | PRN

## 2018-07-06 MED ORDER — MISOPROSTOL 25 MCG QUARTER TABLET
25.0000 ug | ORAL_TABLET | ORAL | Status: DC | PRN
  Administered 2018-07-06: 25 ug via VAGINAL
  Filled 2018-07-06: qty 1

## 2018-07-06 MED ORDER — LABETALOL HCL 5 MG/ML IV SOLN
40.0000 mg | INTRAVENOUS | Status: DC | PRN
  Administered 2018-07-06 (×2): 40 mg via INTRAVENOUS
  Filled 2018-07-06 (×2): qty 8

## 2018-07-06 MED ORDER — OXYTOCIN BOLUS FROM INFUSION
500.0000 mL | Freq: Once | INTRAVENOUS | Status: AC
  Administered 2018-07-08: 500 mL via INTRAVENOUS

## 2018-07-06 MED ORDER — TERBUTALINE SULFATE 1 MG/ML IJ SOLN: 0.2500 mg | Freq: Once | INTRAMUSCULAR | Status: DC | PRN

## 2018-07-06 MED ORDER — FENTANYL CITRATE (PF) 100 MCG/2ML IJ SOLN
100.0000 ug | INTRAMUSCULAR | Status: DC | PRN
  Administered 2018-07-07: 50 ug via INTRAVENOUS
  Administered 2018-07-07: 100 ug via INTRAVENOUS
  Administered 2018-07-07: 50 ug via INTRAVENOUS
  Administered 2018-07-08: 100 ug via INTRAVENOUS
  Filled 2018-07-06 (×5): qty 2

## 2018-07-06 MED ORDER — LACTATED RINGERS IV SOLN
INTRAVENOUS | Status: DC
  Administered 2018-07-07 – 2018-07-08 (×4): via INTRAVENOUS

## 2018-07-06 MED ORDER — ZOLPIDEM TARTRATE 5 MG PO TABS: 5.0000 mg | ORAL_TABLET | Freq: Every evening | ORAL | Status: DC | PRN

## 2018-07-06 MED ORDER — OXYCODONE-ACETAMINOPHEN 5-325 MG PO TABS: 1.0000 | ORAL_TABLET | ORAL | Status: DC | PRN

## 2018-07-06 MED ORDER — FENTANYL-BUPIVACAINE-NACL 0.5-0.125-0.9 MG/250ML-% EP SOLN: 12.0000 mL/h | EPIDURAL | Status: DC | PRN

## 2018-07-06 MED ORDER — LABETALOL HCL 5 MG/ML IV SOLN
20.0000 mg | INTRAVENOUS | Status: DC | PRN
  Administered 2018-07-06 (×2): 20 mg via INTRAVENOUS
  Filled 2018-07-06 (×2): qty 4

## 2018-07-06 MED ORDER — MISOPROSTOL 50MCG HALF TABLET
50.0000 ug | ORAL_TABLET | ORAL | Status: DC | PRN
  Administered 2018-07-07 (×2): 50 ug via BUCCAL
  Filled 2018-07-06 (×5): qty 1

## 2018-07-06 MED ORDER — OXYCODONE-ACETAMINOPHEN 5-325 MG PO TABS: 2.0000 | ORAL_TABLET | ORAL | Status: DC | PRN

## 2018-07-06 MED ORDER — LIDOCAINE HCL (PF) 1 % IJ SOLN: 30.0000 mL | INTRAMUSCULAR | Status: DC | PRN

## 2018-07-06 MED ORDER — MAGNESIUM SULFATE BOLUS VIA INFUSION
4.0000 g | Freq: Once | INTRAVENOUS | Status: AC
  Administered 2018-07-06: 4 g via INTRAVENOUS
  Filled 2018-07-06: qty 500

## 2018-07-06 MED ORDER — OXYTOCIN 40 UNITS IN NORMAL SALINE INFUSION - SIMPLE MED: 2.5000 [IU]/h | INTRAVENOUS | Status: DC

## 2018-07-06 MED ORDER — LABETALOL HCL 200 MG PO TABS
300.0000 mg | ORAL_TABLET | Freq: Two times a day (BID) | ORAL | Status: DC
  Administered 2018-07-06 – 2018-07-09 (×7): 300 mg via ORAL
  Filled 2018-07-06 (×7): qty 1

## 2018-07-06 NOTE — H&P (Addendum)
History     CSN: [id number]  Arrival date and time: 07/06/18 1518   First Provider Initiated Contact with Patient 07/06/18 1601      Chief Complaint  Patient presents with  . Hypertension   [name] is a 38 y.o. G2P1001 at [redacted]w[redacted]d by LMP who presents to MAU for preeclampsia evaluation after [employer] notified her office that she had elevated Bps at home of 140s/90s and was told by her office to come in to MAU.  Pt denies HA, blurry vision/seeing spots, N/V, epigastric pain, swelling in face and hands, sudden weight gain. Of note, pt reports she had a HA all of yesterday and this morning, but it went away without treatment and is not present at time time. Pt also reports she was seeing spots yesterday "off and on" for short periods of time, but this visual disturbance is not present at the time. Pt denies chest pain and SOB.  Pt denies constipation, diarrhea, or urinary problems. Pt denies fever, chills, fatigue, sweating or changes in appetite. Pt denies dizziness, light-headedness, weakness.  Pt denies VB, LOF and reports good FM. Pt reports irregular ctx. Pt reports possible, slight decrease in FM.  Current pregnancy problems? cHTN Blood Type? A Positive Allergies? NKDA Current medications? baby ASA, labetalol  BID, PNVs, iron; pt reports she did take her labetalol this morning as prescribed Current PNC & next appt? [facility], next week  Pt had two severe range pressures on arrival to MAU. Hydralazine protocol started as pt is already taking labetalol orally.   OB History    Gravida  2   Para  1   Term  1   Preterm  0   AB  0   Living  1     SAB      TAB      Ectopic      Multiple      Live Births  1           Past Medical History:  Diagnosis Date  . HTN (hypertension)   . Hyperemesis gravidarum   . Panic attack   . Tachycardia     Past Surgical History:  Procedure Laterality Date  . BREAST FIBROADENOMA SURGERY    .  gardisil x 3 - 08/30/2005      Family History  Problem Relation Age of Onset  . Coronary artery disease Mother   . Hypertension Mother   . Hypertension Father   . Hypertension Sister     Social History   Tobacco Use  . Smoking status: Never Smoker  . Smokeless tobacco: Never Used  Substance Use Topics  . Alcohol use: No  . Drug use: No    Allergies: No Known Allergies  Medications Prior to Admission  Medication Sig Dispense Refill Last Dose  . aspirin EC 81 MG tablet Take 1 tablet (81 mg total) by mouth daily. Take after 12 weeks for prevention of preeclampsia later in pregnancy 300 tablet 2 07/05/2018 at Unknown time  . ferrous sulfate 325 (65 FE) MG tablet Take 1 tablet (325 mg total) by mouth daily with breakfast. 30 tablet 3 07/06/2018 at Unknown time  . fluticasone (FLONASE) 50 MCG/ACT nasal spray Place into both nostrils daily.   Past Month at Unknown time  . labetalol (NORMODYNE) 300 MG tablet Take 1 tablet (300 mg total) by mouth 2 (two) times daily. 30 tablet 1 07/06/2018 at Unknown time  . prenatal vitamin w/FE, FA (PRENATAL 1 + 1) 27-1 MG TABS  tablet Take 1 tablet by mouth daily at 12 noon.   07/06/2018 at Unknown time    Review of Systems  Constitutional: Negative for chills, diaphoresis, fatigue and fever.  Eyes: Negative for visual disturbance.  Respiratory: Negative for chest tightness.   Cardiovascular: Negative for chest pain.  Gastrointestinal: Negative for abdominal pain, constipation, diarrhea, nausea and vomiting.  Genitourinary: Negative for dysuria, flank pain, frequency, pelvic pain, urgency, vaginal bleeding and vaginal discharge.  Neurological: Negative for dizziness, weakness, light-headedness and headaches.   Physical Exam   Blood pressure (!) 166/106, pulse 81, last menstrual period 10/27/2017, SpO2 99 %.  Patient Vitals for the past 24 hrs:  BP Pulse SpO2  07/06/18 1745 (!) 166/106 81 -  07/06/18 1730 (!) 178/102 88 -  07/06/18 1645 (!) 153/95 93  99 %  07/06/18 1630 (!) 156/101 89 -  07/06/18 1615 (!) 169/97 89 -  07/06/18 1613 (!) 181/102 81 -  07/06/18 1608 (!) 186/112 84 -  07/06/18 1600 (!) 179/105 66 -  07/06/18 1541 - - 99 %    Physical Exam  Constitutional: She is oriented to person, place, and time. She appears well-developed and well-nourished. No distress.  HENT:  Head: Normocephalic and atraumatic.  Respiratory: Effort normal.  GI: Soft. She exhibits no distension and no mass. There is no abdominal tenderness. There is no rebound and no guarding.  Neurological: She is alert and oriented to person, place, and time.  Skin: Skin is warm and dry. She is not diaphoretic.  Psychiatric: She has a normal mood and affect. Her behavior is normal. Judgment and thought content normal.  Pt reports PMH of anxiety and is tearful and anxious during discussion with MAU provider.   Results for orders placed or performed during the hospital encounter of 07/06/18 (from the past 24 hour(s))  Comprehensive metabolic panel     Status: Abnormal   Collection Time: 07/06/18  3:57 PM  Result Value Ref Range   Sodium 137 135 - 145 mmol/L   Potassium 4.2 3.5 - 5.1 mmol/L   Chloride 102 98 - 111 mmol/L   CO2 23 22 - 32 mmol/L   Glucose, Bld 89 70 - 99 mg/dL   BUN 10 6 - 20 mg/dL   Creatinine, Ser 8.65 0.44 - 1.00 mg/dL   Calcium 9.0 8.9 - 78.4 mg/dL   Total Protein 6.0 (L) 6.5 - 8.1 g/dL   Albumin 2.7 (L) 3.5 - 5.0 g/dL   AST 25 15 - 41 U/L   ALT 23 0 - 44 U/L   Alkaline Phosphatase 116 38 - 126 U/L   Total Bilirubin 0.4 0.3 - 1.2 mg/dL   GFR calc non Af Amer >60 >60 mL/min   GFR calc Af Amer >60 >60 mL/min   Anion gap 12 5 - 15  CBC     Status: Abnormal   Collection Time: 07/06/18  3:57 PM  Result Value Ref Range   WBC 5.9 4.0 - 10.5 K/uL   RBC 3.62 (L) 3.87 - 5.11 MIL/uL   Hemoglobin 12.2 12.0 - 15.0 g/dL   HCT 69.6 29.5 - 28.4 %   MCV 100.0 80.0 - 100.0 fL   MCH 33.7 26.0 - 34.0 pg   MCHC 33.7 30.0 - 36.0 g/dL   RDW 13.2  44.0 - 10.2 %   Platelets 156 150 - 400 K/uL   nRBC 0.0 0.0 - 0.2 %  Protein / creatinine ratio, urine     Status: Abnormal   Collection  Time: 07/06/18  3:58 PM  Result Value Ref Range   Creatinine, Urine 242.70 mg/dL   Total Protein, Urine 58 mg/dL   Protein Creatinine Ratio 0.24 (H) 0.00 - 0.15 mg/mg[Cre]    [place] Mfm Fetal Bpp Wo Non Stress  Result Date: 07/04/2018 ----------------------------------------------------------------------  OBSTETRICS REPORT                        (Signed Final 07/04/2018 11:24 am) ---------------------------------------------------------------------- Patient Info  ID #:       [id number]                          D.O.B.:  26-Jun-1980 (38 yrs)  Name:       Arlyss Repress              Visit Date: 07/04/2018 10:47 am ---------------------------------------------------------------------- Performed By  Performed By:     Emeline Darling BS,      Secondary Phy.:    [facility]                    RDMS  Attending:        Noralee Space MD        Address:           [address]                                                              [address]  Referred By:      Conan Bowens          Location[facility]          [facility]                    MD                                        [facility]  Ref. Address:     [address] ---------------------------------------------------------------------- Orders   #  Description                          Code         Ordered By   1  [place] MFM FETAL BPP WO NON              [id number]     RAVI [facility]      STRESS  ----------------------------------------------------------------------   #  Order #                    Accession #                 Episode #   1  [id number]                  [id number]                  [id number]  ---------------------------------------------------------------------- Indications   [redacted] weeks gestation of pregnancy  Z3A.35   Hypertension - Chronic/Pre-existing            O10.019   (labetalol)    Abnormal finding on antenatal screening        O28.9   (cyst in RT lung)   Advanced maternal age multigravida 81+,        O49.523   third trimester(Low Risk NIPS)(Negative   AFP)  ---------------------------------------------------------------------- Vital Signs  Weight (lb): 147                               Height:        5'6"  BMI:         23.72 ---------------------------------------------------------------------- Fetal Evaluation  Num Of Fetuses:          1  Fetal Heart Rate(bpm):   140  Cardiac Activity:        Observed  Presentation:            Cephalic  Placenta:                Posterior  P. Cord Insertion:       Previously Visualized  Amniotic Fluid  AFI FV:      Within normal limits  AFI Sum(cm)     %Tile       Largest Pocket(cm)  20.41           77          6.51  RUQ(cm)       RLQ(cm)       LUQ(cm)        LLQ(cm)  5.61          3.75          4.54           6.51 ---------------------------------------------------------------------- Biophysical Evaluation  Amniotic F.V:   Pocket => 2 cm two         F. Tone:         Observed                  planes  F. Movement:    Observed                   Score:           8/8  F. Breathing:   Observed ---------------------------------------------------------------------- OB History  Gravidity:    2         Term:   1  Living:       1 ---------------------------------------------------------------------- Gestational Age  LMP:           35w 5d        Date:  10/27/17                 EDD:   08/03/18  Best:          35w 5d     Det. By:  LMP  (10/27/17)          EDD:   08/03/18 ---------------------------------------------------------------------- Impression  Chronic hypertension. Patient takes labetalol for control.  Amniotic fluid is normal and good fetal activity is seen.  Antenatal testing is reassuring. BPP 8/8. Cystic mass is seen  in the right lung again that is essentially unchanged from  previous scans. No evidence of fetal hydrops.  ---------------------------------------------------------------------- Recommendations  -Continue weekly BPP. ----------------------------------------------------------------------                  Noralee Space, MD Electronically  Signed Final Report   07/04/2018 11:24 am ----------------------------------------------------------------------    MAU Course  Procedures  MDM -preeclampsia work-up, no sx at this time other than elevated BP -irregular ctx, possible slight DFM per pt report -two severe range pressures on admittance to MAU, hydralazine protocol started -pt was given hydralazine , hydralazine  before pressures went below severe range to 150s/90s -EFM: reactive with few variables       -baseline: 140       -variability: moderate       -accels: present, 15x15       -decels: few variable       -TOCO: irregular ctx, pt reports ctx are painful -CBC: platelets 156 (down from 218 4weeks ago, 237 49mo ago, 270 50mo ago) -CMP: AST/ALT 25/23, similar to 32mo ago -PCr: 0.24 (up from 0.14 on 06/05/2018) -spoke with Dr. Shawnie Pons , per Dr. Shawnie Pons, will watch pt for 2-3hours in MAU and call at the end of that period, or will call if pt has another severe range BP -called Dr. Shawnie Pons after next severe range BP, per Dr. Shawnie Pons, admit to L&D for delivery -called and spoke with Philipp Deputy, CNM and relayed above information; Selena Batten to enter admit orders for pt and discuss magnesium administration with Dr. Shawnie Pons; care transferred to Philipp Deputy  -Bedside US confirms VTX presentation, Philipp Deputy called with report and informed pt would like epidural, Kim to enter orders for pt to be started on magnesium  Nugent, Odie Sera, NP  5:59 PM 07/06/2018  Orders Placed This Encounter  Procedures  . Comprehensive metabolic panel    Standing Status:   Standing    Number of Occurrences:   1  . CBC    Standing Status:   Standing    Number of Occurrences:   1  . Protein / creatinine ratio, urine    Standing  Status:   Standing    Number of Occurrences:   1  . Notify Physician    Confirmatory reading of BP> 160/110 15 minutes later    Standing Status:   Standing    Number of Occurrences:   1    Order Specific Question:   Notify Physician    Answer:   Temp greater than or equal to 100.4    Order Specific Question:   Notify Physician    Answer:   RR greater than 24 or less than 10    Order Specific Question:   Notify Physician    Answer:   HR greater than 120 or less than 50    Order Specific Question:   Notify Physician    Answer:   SBP greater than 160 mmHG or less than 80 mmHG    Order Specific Question:   Notify Physician    Answer:   DBP greater than 110 mmHG or less than 45 mmHG    Order Specific Question:   Notify Physician    Answer:   Urinary output is less than for any 4 hour period  . Measure blood pressure    10 minutes after giving labetalol 40 MG IV dose.  Call MD if SBP >/= 160 or DBP >/= 110.    Standing Status:   Standing    Number of Occurrences:   1  . Measure blood pressure    Standing Status:   Standing    Number of Occurrences:   1   Meds ordered this encounter  Medications  . AND Linked Order Group   .  hydrALAZINE (APRESOLINE) injection 5 mg   . hydrALAZINE (APRESOLINE) injection 10 mg   . labetalol (NORMODYNE) injection 20 mg   . labetalol (NORMODYNE) injection 40 mg  . lactated ringers infusion   Assessment and Plan   -admit to L&D for induction -care transferred to Philipp DeputyKim , CNM  Nugent, Odie SeraNicole E, NP  6:01 PM 07/06/2018   Pt now on L&D: 07/06/2018, 2010  Feeling anxious about her admission and the IOL process. Denies s/s pre-e. Mag sulfate bolus just completed- feeling kind of flushed. PCN going. S/p Labetalol IV 20mg  and 40mg  since arrival to L&D.  BPs 169/101, 158/104, P 93 FHR 135, +accels, occ variables, Cat 1 Ctx irreg, mostly mild 5-8 mins Cx closed/thick/soft/-3 (vtx by bedside u/s prior to coming up from MAU)  IUP@36 .0wks cHTN w/  severe SIPE (BPs & borderline low platelets) GBS unk Cx unfavorable  -Admit to Labor & Delivery -Plan cx ripening initially with cytotec, followed by cervical foley, Pit, AROM prn -Continue mag sulfate -Continue PCN for GBS unknown -Continue BP management (Lab 300 bid + IV prn) -GBS culture pending for peds -PITT: stable right lung cyst 1cm x 1cm x 1cm -Vistaril prn anxiety  Arabella MerlesKimberly D  [facility] 07/06/2018 9:23 PM

## 2018-07-06 NOTE — Anesthesia Preprocedure Evaluation (Deleted)
Anesthesia Evaluation  Patient identified by MRN, date of birth, ID band Patient awake    Reviewed: Allergy & Precautions, H&P , NPO status , Patient's Chart, lab work & pertinent test results  History of Anesthesia Complications Negative for: history of anesthetic complications  Airway Mallampati: II  TM Distance: >3 FB Neck ROM: full    Dental no notable dental hx. (+) Teeth Intact   Pulmonary neg pulmonary ROS,    Pulmonary exam normal breath sounds clear to auscultation       Cardiovascular hypertension, Pt. on medications Normal cardiovascular exam Rhythm:regular Rate:Normal     Neuro/Psych negative neurological ROS  negative psych ROS   GI/Hepatic negative GI ROS, Neg liver ROS,   Endo/Other  negative endocrine ROS  Renal/GU negative Renal ROS  negative genitourinary   Musculoskeletal   Abdominal   Peds  Hematology negative hematology ROS (+)   Anesthesia Other Findings   Reproductive/Obstetrics (+) Pregnancy (PIH)                            Anesthesia Physical Anesthesia Plan  ASA: II  Anesthesia Plan: Epidural   Post-op Pain Management:    Induction:   PONV Risk Score and Plan:   Airway Management Planned:   Additional Equipment:   Intra-op Plan:   Post-operative Plan:   Informed Consent: I have reviewed the patients History and Physical, chart, labs and discussed the procedure including the risks, benefits and alternatives for the proposed anesthesia with the patient or authorized representative who has indicated his/her understanding and acceptance.       Plan Discussed with:   Anesthesia Plan Comments:        Anesthesia Quick Evaluation

## 2018-07-06 NOTE — Telephone Encounter (Signed)
Received call from [employer] regarding elevated blood pressures, 143/92 and 146/98.  Called Paige Neal to discuss.  Paige Neal states she is experiencing a headache that is constant but she is not taking anything for it.  Paige Neal states occasional visual changes. Paige Neal states she is taking Labetalol 300mg  BID.  Recommended to Paige Neal that since she is symptomatic, that she go to MAU.  Paige Neal verbalized understanding but seemed unsure if she would go.  Emphasized importance of Paige Neal going to MAU to be evaluated and to rule out pre-eclampsia.  Paige Neal verbalized understanding but stated "ooookkaaay."

## 2018-07-06 NOTE — MAU Note (Signed)
PT presents for increase in blood pressures. Had a headache this morning which she states went away without any medications. Had a headache yesterday with periods of seeing spots. Denies any pain at present. Pt is taking labetalol for her blood pressure and it was increased last month

## 2018-07-07 LAB — CBC
HCT: 37.7 % (ref 36.0–46.0)
Hemoglobin: 12.8 g/dL (ref 12.0–15.0)
MCH: 33.1 pg (ref 26.0–34.0)
MCHC: 34 g/dL (ref 30.0–36.0)
MCV: 97.4 fL (ref 80.0–100.0)
Platelets: 173 10*3/uL (ref 150–400)
RBC: 3.87 MIL/uL (ref 3.87–5.11)
RDW: 15.8 % — ABNORMAL HIGH (ref 11.5–15.5)
WBC: 8.8 10*3/uL (ref 4.0–10.5)
nRBC: 0 % (ref 0.0–0.2)

## 2018-07-07 LAB — RPR: RPR Ser Ql: NONREACTIVE

## 2018-07-07 LAB — MAGNESIUM: Magnesium: 6 mg/dL — ABNORMAL HIGH (ref 1.7–2.4)

## 2018-07-07 MED ORDER — MISOPROSTOL 25 MCG QUARTER TABLET
25.0000 ug | ORAL_TABLET | ORAL | Status: DC
  Administered 2018-07-07: 25 ug via VAGINAL
  Filled 2018-07-07: qty 1

## 2018-07-07 MED ORDER — OXYTOCIN 40 UNITS IN NORMAL SALINE INFUSION - SIMPLE MED
1.0000 m[IU]/min | INTRAVENOUS | Status: DC
  Administered 2018-07-08: 2 m[IU]/min via INTRAVENOUS
  Filled 2018-07-07: qty 1000

## 2018-07-07 MED ORDER — MISOPROSTOL 25 MCG QUARTER TABLET
25.0000 ug | ORAL_TABLET | Freq: Once | ORAL | Status: AC
  Administered 2018-07-07: 25 ug via VAGINAL

## 2018-07-07 MED ORDER — FENTANYL CITRATE (PF) 100 MCG/2ML IJ SOLN
50.0000 ug | Freq: Once | INTRAMUSCULAR | Status: AC
  Administered 2018-07-07: 50 ug via INTRAVENOUS
  Filled 2018-07-07: qty 2

## 2018-07-07 MED ORDER — TERBUTALINE SULFATE 1 MG/ML IJ SOLN: 0.2500 mg | Freq: Once | INTRAMUSCULAR | Status: DC | PRN

## 2018-07-07 MED ORDER — FLEET ENEMA 7-19 GM/118ML RE ENEM
1.0000 | ENEMA | Freq: Once | RECTAL | Status: AC
  Administered 2018-07-07: 1 via RECTAL

## 2018-07-07 NOTE — Progress Notes (Signed)
Vitals:   07/07/18 1400 07/07/18 1500  BP: (!) 137/97 134/87  Pulse: 81 80  Resp: 18 18  Temp:    SpO2:     cx still nearly closed but thinner. Lots of stool in rectum. FHR Cat 1 w/periods of minimal variability. Output normal. MgSO4 level ordered. Ctx mild and regular, q 2-3 mintues. Cytotec placed again. Hopeful for foley placement next check.

## 2018-07-07 NOTE — Progress Notes (Signed)
Patient ID: Paige Neal, female   DOB: [birth year], 38 y.o.   MRN: [id number]  Dozing some; hasn't rested well; feels 'sick' from the magnesium; s/p cytotec x 3 doses (1 vag, 2 buccal); no s/sp pre-e; last IV antihypertensive around midnight  BPs 139/92, 139/87, 150/99, P 90s FHR 130s, +accels, Cat 1 Ctx irreg 2-5 mins, mild Cx deferred  IUP@36 .1wks cHTN w/ SIPE Cx unfavorable GBS pend  -Most likely will need another cytotec or two, but plan to try cervical foley today; pt offered premedication prior to foley due to her anxiety- will think about -CBC at noon to watch platelets  Arabella Merles CNM 07/07/2018 7:24 AM

## 2018-07-07 NOTE — Progress Notes (Addendum)
Pt took IV fentanyl before vag exam and did well. Cx barely FT at inner os/thick/-2/mid position. Cytotec 25 mcg placed in posterior fornix.  Mild ctx q 1-5 minutes.  FHR Cat 1. MgSO4 @ 2gm/hr.  Vitals:   07/07/18 0700 07/07/18 0807 07/07/18 0900 07/07/18 0957  BP: (!) 139/92 (!) 145/100 (!) 142/92 (!) 142/89  Pulse: 92 84 89 91  Resp: 16 18 18 18   Temp:  98.8 F (37.1 C)    TempSrc:  Oral    SpO2:      Weight:      Height:

## 2018-07-07 NOTE — Progress Notes (Signed)
  Patient Vitals for the past 6 hrs:  BP Pulse Resp  07/07/18 2147 (!) 142/98 71 16  07/07/18 2001 134/83 83 16  07/07/18 1907 (!) 144/93 86 18  07/07/18 1900 (!) 175/109 92 18  07/07/18 1800 125/85 82 18  07/07/18 1700 (!) 131/91 81 18  07/07/18 1600 126/87 81 18   Ctx q 3-4 minutes, mild.  FHR Cat 1.  MgSO4 level 6.  cx 1.5/50/-3, very soft.  Foley inserted and inflated w/60cc H20. WIll start pitocin when it falls out.

## 2018-07-08 ENCOUNTER — Inpatient Hospital Stay (HOSPITAL_COMMUNITY): Payer: BLUE CROSS/BLUE SHIELD | Admitting: Anesthesiology

## 2018-07-08 ENCOUNTER — Encounter (HOSPITAL_COMMUNITY): Payer: Self-pay | Admitting: Family Medicine

## 2018-07-08 DIAGNOSIS — O1414 Severe pre-eclampsia complicating childbirth: Secondary | ICD-10-CM

## 2018-07-08 DIAGNOSIS — Z3A36 36 weeks gestation of pregnancy: Secondary | ICD-10-CM

## 2018-07-08 LAB — CBC
HCT: 38 % (ref 36.0–46.0)
HCT: 36.1 % (ref 36.0–46.0)
Hemoglobin: 12.9 g/dL (ref 12.0–15.0)
Hemoglobin: 12.2 g/dL (ref 12.0–15.0)
MCH: 32.9 pg (ref 26.0–34.0)
MCH: 33.3 pg (ref 26.0–34.0)
MCHC: 33.9 g/dL (ref 30.0–36.0)
MCHC: 33.8 g/dL (ref 30.0–36.0)
MCV: 96.9 fL (ref 80.0–100.0)
MCV: 98.6 fL (ref 80.0–100.0)
Platelets: 177 10*3/uL (ref 150–400)
Platelets: 176 10*3/uL (ref 150–400)
RBC: 3.92 MIL/uL (ref 3.87–5.11)
RBC: 3.66 MIL/uL — ABNORMAL LOW (ref 3.87–5.11)
RDW: 16 % — ABNORMAL HIGH (ref 11.5–15.5)
RDW: 16 % — ABNORMAL HIGH (ref 11.5–15.5)
WBC: 7.7 10*3/uL (ref 4.0–10.5)
WBC: 12.9 10*3/uL — ABNORMAL HIGH (ref 4.0–10.5)
nRBC: 0 % (ref 0.0–0.2)
nRBC: 0 % (ref 0.0–0.2)

## 2018-07-08 MED ORDER — MISOPROSTOL 200 MCG PO TABS
800.0000 ug | ORAL_TABLET | Freq: Once | ORAL | Status: AC
  Administered 2018-07-08: 800 ug via RECTAL

## 2018-07-08 MED ORDER — LACTATED RINGERS IV SOLN
500.0000 mL | Freq: Once | INTRAVENOUS | Status: AC
  Administered 2018-07-08: 500 mL via INTRAVENOUS

## 2018-07-08 MED ORDER — ACETAMINOPHEN 325 MG PO TABS
650.0000 mg | ORAL_TABLET | Freq: Four times a day (QID) | ORAL | Status: DC | PRN
  Administered 2018-07-08: 650 mg via ORAL
  Filled 2018-07-08: qty 2

## 2018-07-08 MED ORDER — FENTANYL-BUPIVACAINE-NACL 0.5-0.125-0.9 MG/250ML-% EP SOLN: 12.0000 mL/h | EPIDURAL | Status: DC | PRN

## 2018-07-08 MED ORDER — IBUPROFEN 600 MG PO TABS: 600.0000 mg | ORAL_TABLET | Freq: Four times a day (QID) | ORAL | Status: DC | PRN

## 2018-07-08 MED ORDER — WITCH HAZEL-GLYCERIN EX PADS: 1.0000 "application " | MEDICATED_PAD | CUTANEOUS | Status: DC | PRN

## 2018-07-08 MED ORDER — PHENYLEPHRINE 40 MCG/ML (10ML) SYRINGE FOR IV PUSH (FOR BLOOD PRESSURE SUPPORT)
80.0000 ug | PREFILLED_SYRINGE | INTRAVENOUS | Status: DC | PRN
  Filled 2018-07-08: qty 10

## 2018-07-08 MED ORDER — FENTANYL-BUPIVACAINE-NACL 0.5-0.125-0.9 MG/250ML-% EP SOLN
12.0000 mL/h | EPIDURAL | Status: DC | PRN
  Filled 2018-07-08: qty 250

## 2018-07-08 MED ORDER — MEASLES, MUMPS & RUBELLA VAC IJ SOLR: 0.5000 mL | Freq: Once | INTRAMUSCULAR | Status: DC

## 2018-07-08 MED ORDER — EPHEDRINE 5 MG/ML INJ: 10.0000 mg | INTRAVENOUS | Status: DC | PRN

## 2018-07-08 MED ORDER — IBUPROFEN 600 MG PO TABS
600.0000 mg | ORAL_TABLET | Freq: Four times a day (QID) | ORAL | Status: DC
  Administered 2018-07-08 – 2018-07-10 (×7): 600 mg via ORAL
  Filled 2018-07-08 (×7): qty 1

## 2018-07-08 MED ORDER — BENZOCAINE-MENTHOL 20-0.5 % EX AERO
1.0000 "application " | INHALATION_SPRAY | CUTANEOUS | Status: DC | PRN
  Filled 2018-07-08: qty 56

## 2018-07-08 MED ORDER — DIBUCAINE (PERIANAL) 1 % EX OINT: 1.0000 "application " | TOPICAL_OINTMENT | CUTANEOUS | Status: DC | PRN

## 2018-07-08 MED ORDER — SIMETHICONE 80 MG PO CHEW: 80.0000 mg | CHEWABLE_TABLET | ORAL | Status: DC | PRN

## 2018-07-08 MED ORDER — LIDOCAINE HCL (PF) 1 % IJ SOLN
INTRAMUSCULAR | Status: DC | PRN
  Administered 2018-07-08 (×2): 5 mL via EPIDURAL

## 2018-07-08 MED ORDER — SENNOSIDES-DOCUSATE SODIUM 8.6-50 MG PO TABS
2.0000 | ORAL_TABLET | ORAL | Status: DC
  Administered 2018-07-08: 2 via ORAL
  Filled 2018-07-08: qty 2

## 2018-07-08 MED ORDER — ACETAMINOPHEN 325 MG PO TABS: 650.0000 mg | ORAL_TABLET | ORAL | Status: DC | PRN

## 2018-07-08 MED ORDER — ONDANSETRON HCL 4 MG PO TABS: 4.0000 mg | ORAL_TABLET | ORAL | Status: DC | PRN

## 2018-07-08 MED ORDER — PRENATAL MULTIVITAMIN CH
1.0000 | ORAL_TABLET | Freq: Every day | ORAL | Status: DC
  Administered 2018-07-09 – 2018-07-10 (×2): 1 via ORAL
  Filled 2018-07-08 (×2): qty 1

## 2018-07-08 MED ORDER — SODIUM CHLORIDE (PF) 0.9 % IJ SOLN
INTRAMUSCULAR | Status: DC | PRN
  Administered 2018-07-08: 12 mL/h via EPIDURAL

## 2018-07-08 MED ORDER — MISOPROSTOL 200 MCG PO TABS
ORAL_TABLET | ORAL | Status: AC
  Filled 2018-07-08: qty 4

## 2018-07-08 MED ORDER — DIPHENHYDRAMINE HCL 25 MG PO CAPS: 25.0000 mg | ORAL_CAPSULE | Freq: Four times a day (QID) | ORAL | Status: DC | PRN

## 2018-07-08 MED ORDER — MAGNESIUM SULFATE 40 G IN LACTATED RINGERS - SIMPLE
2.0000 g/h | INTRAVENOUS | Status: AC
  Administered 2018-07-09: 2 g/h via INTRAVENOUS
  Filled 2018-07-08: qty 500

## 2018-07-08 MED ORDER — ZOLPIDEM TARTRATE 5 MG PO TABS: 5.0000 mg | ORAL_TABLET | Freq: Every evening | ORAL | Status: DC | PRN

## 2018-07-08 MED ORDER — TETANUS-DIPHTH-ACELL PERTUSSIS 5-2.5-18.5 LF-MCG/0.5 IM SUSP: 0.5000 mL | Freq: Once | INTRAMUSCULAR | Status: DC

## 2018-07-08 MED ORDER — ONDANSETRON HCL 4 MG/2ML IJ SOLN: 4.0000 mg | INTRAMUSCULAR | Status: DC | PRN

## 2018-07-08 MED ORDER — DIPHENHYDRAMINE HCL 50 MG/ML IJ SOLN: 12.5000 mg | INTRAMUSCULAR | Status: DC | PRN

## 2018-07-08 MED ORDER — COCONUT OIL OIL: 1.0000 "application " | TOPICAL_OIL | Status: DC | PRN

## 2018-07-08 NOTE — Discharge Summary (Signed)
Obstetrics Discharge Summary OB/GYN Faculty Practice   Patient Name: Paige Neal DOB: January 15, 1981 MRN: [id number]  Date of admission: 07/06/2018 Delivering MD: Tamera StandsWALLACE, LAUREL S   Date of discharge: 07/10/2018  Admitting diagnosis: HIGH BP Intrauterine pregnancy: 229w2d     Secondary diagnosis:   Active Problems:   AMA (advanced maternal age) multigravida 35+, unspecified trimester   Chronic hypertension with superimposed pre-eclampsia     Discharge diagnosis: Preterm Pregnancy Delivered                                    Postpartum procedures: 24-hours of postpartum Mg++  Complications: None  Outpatient Follow-Up: [ ]  1-2 week BP check - message sent  Hospital course: Paige Neal is a 38 y.o. 4129w2d who was admitted for induction of labor for superimposed severe preeclampsia by blood pressures. [facility]. Her labor course was notable for induction with 5 doses of cytotec and FB followed by pitocin, SROM, and epidural placement. Delivery was complicated by 1st degree perineal laceration repaired and initially poor uterine tone with EBL of 700cc with one dose of rectal cytotec given. Her blood pressures initially required treatment with IV anti-hypertensives but then were well-controlled in labor with labetalol 300mg  BID. She was on magnesium throughout her induction and was continued for 24-hours postpartum. Please see delivery/op note for additional details. Her postpartum course was uncomplicated, Labetalol was increased to 400 mg bid for better control, patient did not want to switch antihypertensive therapy. She was breastfeeding without difficulty. By day of discharge, she was passing flatus, urinating, eating and drinking without difficulty. Her pain was well-controlled, and she was discharged home in stable condition She will have follow-up in 1 week, has BP cuff at home.   Physical exam  Vitals:   07/09/18 2340 07/10/18 0455 07/10/18  0458 07/10/18 0817  BP:   (!) 148/92 (!) 155/92  Pulse:   68 67  Resp:   18 18  Temp:  98.9 F (37.2 C)  98.2 F (36.8 C)  TempSrc:  Oral  Oral  SpO2: 97%  98% 98%  Weight:      Height:       General: NAD Lochia: appropriate Uterine Fundus: firm Incision: N/A DVT Evaluation: No evidence of DVT seen on physical exam. Negative Homan's sign. No cords or calf tenderness. Labs: Lab Results  Component Value Date   WBC 12.9 (H) 07/08/2018   HGB 12.2 07/08/2018   HCT 36.1 07/08/2018   MCV 98.6 07/08/2018   PLT 176 07/08/2018   CMP Latest Ref Rng & Units 07/06/2018  Glucose 70 - 99 mg/dL 89  BUN 6 - 20 mg/dL 10  Creatinine 4.090.44 - 8.111.00 mg/dL 9.140.78  Sodium 782135 - 956145 mmol/L 137  Potassium 3.5 - 5.1 mmol/L 4.2  Chloride 98 - 111 mmol/L 102  CO2 22 - 32 mmol/L 23  Calcium 8.9 - 10.3 mg/dL 9.0  Total Protein 6.5 - 8.1 g/dL 6.0(L)  Total Bilirubin 0.3 - 1.2 mg/dL 0.4  Alkaline Phos 38 - 126 U/L 116  AST 15 - 41 U/L 25  ALT 0 - 44 U/L 23    Discharge instructions: Per After Visit Summary and "Baby and Me Booklet"  After visit meds:  Allergies as of 07/10/2018   No Known Allergies     Medication List    TAKE these medications   aspirin EC 81 MG  tablet Take 1 tablet (81 mg total) by mouth daily. Take after 12 weeks for prevention of preeclampsia later in pregnancy What changed:  when to take this   ferrous sulfate 325 (65 FE) MG tablet Take 1 tablet (325 mg total) by mouth daily with breakfast.   ibuprofen 600 MG tablet Commonly known as:  ADVIL Take 1 tablet (600 mg total) by mouth every 6 (six) hours as needed for moderate pain or cramping.   labetalol 200 MG tablet Commonly known as:  NORMODYNE Take 2 tablets (400 mg total) by mouth 2 (two) times daily. What changed:    medication strength  how much to take   norethindrone 0.35 MG tablet Commonly known as:  MICRONOR Take 1 tablet (0.35 mg total) by mouth daily.   prenatal vitamin w/FE, FA 27-1 MG Tabs  tablet Take 2 tablets by mouth daily at 12 noon.       Postpartum contraception: Progesterone only pills Diet: Routine Diet Activity: Advance as tolerated. Pelvic rest for 6 weeks.   Follow-up Appt: No future appointments. Follow-up Visit:No follow-ups on file.  Please schedule this patient for Postpartum visit in: 4-6 weeks with the following provider: Any provider High risk pregnancy complicated by: chronic HTN > severe preeclampsia Delivery mode:  SVD Anticipated Birth Control: Micronor PP Procedures needed: BP check  Schedule Integrated BH visit: no  Newborn Data: Live born female  Birth Weight: 6 lb 1.5 oz (2764 g) APGAR: 8, 9  Newborn Delivery   Birth date/time:  07/08/2018 12:42:00 Delivery type:  Vaginal, Spontaneous    Baby Feeding: Breast Disposition:home with mother    Jaynie Collins, MD, FACOG Obstetrician & Gynecologist, [facility] for [employer], [facility]

## 2018-07-08 NOTE — Progress Notes (Signed)
Vitals:   07/08/18 0401 07/08/18 0605  BP: 126/78 133/89  Pulse: 78 71  Resp:  16  Temp:  98.5 F (36.9 C)  SpO2:     Foley out. Cx very posterior, feels 4/50/-3/vtx.  FHR Cat 1.  Mild and irregular ctx.  Start pitocin per protocol.

## 2018-07-08 NOTE — Progress Notes (Addendum)
OB/GYN Faculty Practice: Labor Progress Note  Subjective: Into room to introduce self to patient. She is resting, states trying to get some sleep. Denies headache, vision changes.   Objective: BP 134/81   Pulse 80   Temp 97.9 F (36.6 C) (Axillary)   Resp 18   Ht 5\' 6"  (1.676 m)   Wt 66.7 kg   LMP 10/27/2017 (Exact Date)   SpO2 99%   BMI 23.73 kg/m  Gen: tired appearing, NAD Dilation: 4 Effacement (%): 50 Cervical Position: Posterior Station: -3 Presentation: Vertex Exam by:: Thelma Barge Dishmon, CNM  Assessment and Plan: 38 y.o. G2P1001 [redacted]w[redacted]d here for IOL for superimposed preeclampsia with severe features by blood pressures.   Labor: Induction started 5/8 evening with cytotec, received total of 5 doses. Also had FB which is now out. Rechecked this morning and reported by CNM to be difficult exam - started on pitocin. Starting to feel them more regularly, desires epidural as soon as possible. Still intact. Discussed continuing to titrate pitocin as able, AROM when fetal station lower. Patient agreeable to plan. RN called, possible ROM and some bloody show and nosebleed. Still Cat I tracing - will wait to recheck until after epidural placed given patient intolerance with exams.  -- pain control: desires epidural -- PPH Risk: medium  Fetal Well-Being: EFW 76% at 34w6. Cephalic by sutures on prior checks.  -- Category I - continuous fetal monitoring  -- GBS unknown initially - received PCN - now negative   Superimposed Preeclampsia with Severe Features: Asymptomatic, moderate range pressures now that on scheduled oral meds. Adequate urine output, no signs of Mg++ toxicity.  -- Mg++ -- labetalol 300mg  BID, hydralazine protocol prn    Ithiel Liebler S. Earlene Plater, DO OB/GYN Fellow, Faculty Practice  9:42 AM

## 2018-07-08 NOTE — Anesthesia Preprocedure Evaluation (Signed)
Anesthesia Evaluation  Patient identified by MRN, date of birth, ID band Patient awake    Reviewed: Allergy & Precautions, NPO status , Patient's Chart, lab work & pertinent test results  Airway Mallampati: II  TM Distance: >3 FB Neck ROM: Full    Dental no notable dental hx.    Pulmonary neg pulmonary ROS,    Pulmonary exam normal breath sounds clear to auscultation       Cardiovascular hypertension, Pt. on medications Normal cardiovascular exam Rhythm:Regular Rate:Normal     Neuro/Psych negative neurological ROS  negative psych ROS   GI/Hepatic negative GI ROS, Neg liver ROS,   Endo/Other  negative endocrine ROS  Renal/GU negative Renal ROS  negative genitourinary   Musculoskeletal negative musculoskeletal ROS (+)   Abdominal   Peds negative pediatric ROS (+)  Hematology negative hematology ROS (+)   Anesthesia Other Findings   Reproductive/Obstetrics (+) Pregnancy PIH Pre-E, on Mag                             Anesthesia Physical Anesthesia Plan  ASA: II  Anesthesia Plan: Epidural   Post-op Pain Management:    Induction:   PONV Risk Score and Plan: Treatment may vary due to age or medical condition  Airway Management Planned:   Additional Equipment:   Intra-op Plan:   Post-operative Plan:   Informed Consent: I have reviewed the patients History and Physical, chart, labs and discussed the procedure including the risks, benefits and alternatives for the proposed anesthesia with the patient or authorized representative who has indicated his/her understanding and acceptance.       Plan Discussed with:   Anesthesia Plan Comments:         Anesthesia Quick Evaluation

## 2018-07-08 NOTE — Anesthesia Procedure Notes (Signed)
Epidural Patient location during procedure: OB  Staffing Anesthesiologist: Eesa Justiss, MD Performed: anesthesiologist   Preanesthetic Checklist Completed: patient identified, site marked, surgical consent, pre-op evaluation, timeout performed, IV checked, risks and benefits discussed and monitors and equipment checked  Epidural Patient position: sitting Prep: DuraPrep Patient monitoring: heart rate, continuous pulse ox and blood pressure Approach: midline Location: L3-L4 Injection technique: LOR saline  Needle:  Needle type: Tuohy  Needle gauge: 17 G Needle length: 9 cm and 9 Needle insertion depth: 6 cm Catheter type: closed end flexible Catheter size: 20 Guage Catheter at skin depth: 10 cm Test dose: negative  Assessment Events: blood not aspirated, injection not painful, no injection resistance, negative IV test and no paresthesia  Additional Notes Patient identified. Risks/Benefits/Options discussed with patient including but not limited to bleeding, infection, nerve damage, paralysis, failed block, incomplete pain control, headache, blood pressure changes, nausea, vomiting, reactions to medication both or allergic, itching and postpartum back pain. Confirmed with bedside nurse the patient's most recent platelet count. Confirmed with patient that they are not currently taking any anticoagulation, have any bleeding history or any family history of bleeding disorders. Patient expressed understanding and wished to proceed. All questions were answered. Sterile technique was used throughout the entire procedure. Please see nursing notes for vital signs. Test dose was given through epidural needle and negative prior to continuing to dose epidural or start infusion. Warning signs of high block given to the patient including shortness of breath, tingling/numbness in hands, complete motor block, or any concerning symptoms with instructions to call for help. Patient was given  instructions on fall risk and not to get out of bed. All questions and concerns addressed with instructions to call with any issues.     

## 2018-07-09 LAB — CULTURE, BETA STREP (GROUP B ONLY)

## 2018-07-09 MED ORDER — POLYETHYLENE GLYCOL 3350 17 G PO PACK
17.0000 g | PACK | Freq: Every day | ORAL | Status: DC
  Administered 2018-07-09 – 2018-07-10 (×2): 17 g via ORAL
  Filled 2018-07-09 (×2): qty 1

## 2018-07-09 MED ORDER — LACTATED RINGERS IV SOLN
INTRAVENOUS | Status: AC
  Administered 2018-07-09: 10:00:00 via INTRAVENOUS

## 2018-07-09 MED ORDER — SENNOSIDES-DOCUSATE SODIUM 8.6-50 MG PO TABS: 2.0000 | ORAL_TABLET | Freq: Every evening | ORAL | Status: DC | PRN

## 2018-07-09 MED ORDER — POLYSACCHARIDE IRON COMPLEX 150 MG PO CAPS: 150.0000 mg | ORAL_CAPSULE | Freq: Every day | ORAL | Status: DC

## 2018-07-09 MED ORDER — LACTATED RINGERS IV SOLN
INTRAVENOUS | Status: DC
  Administered 2018-07-09: via INTRAVENOUS

## 2018-07-09 MED ORDER — SIMETHICONE 80 MG PO CHEW
80.0000 mg | CHEWABLE_TABLET | Freq: Three times a day (TID) | ORAL | Status: DC
  Administered 2018-07-09 – 2018-07-10 (×3): 80 mg via ORAL
  Filled 2018-07-09 (×4): qty 1

## 2018-07-09 NOTE — Progress Notes (Signed)
MOB was referred for history of depression/anxiety. * Referral screened out by Clinical Social Worker because none of the following criteria appear to apply: ~ History of anxiety/depression during this pregnancy, or of post-partum depression following prior delivery. ~ Diagnosis of anxiety and/or depression within last 3 years OR * MOB's symptoms currently being treated with medication and/or therapy. Please contact the Clinical Social Worker if needs arise, by MOB request, or if MOB scores greater than 9/yes to question 10 on Edinburgh Postpartum Depression Screen.  Mariusz Jubb Boyd-Gilyard, MSW, LCSW Clinical Social Work [phone]  

## 2018-07-09 NOTE — Lactation Note (Signed)
This note was copied from a baby's chart. Lactation Consultation Note  Patient Name: Paige Neal [id number] Date: 07/09/2018 Reason for consult: Follow-up assessment;Late-preterm 34-36.6wks;Primapara;1st time breastfeeding  P2 mother whose infant has just turned 28 hours old.  This is a LPTI at 36+2 weeks weighing 6+1.5 oz.  Mother stated that baby had originally latched well.  However, she is now having difficulty getting him to latch and keeping him awake when he does latch.  Offered to assist and mother accepted.    Mother's breasts are small and non tender and nipples are inverted.  Mother has breast shells but is not wearing them at this time.  She is also familiar with the hand pump to help evert nipples.  Mother has not been pumping consistently.  Spent a lot of time educating patient on the LPTI, pumping, frequency, feeding goals and feeding plan.  Eventually, mother desires to exclusively breast feed.  However, at this time, I explained the importance of supplementing baby and providing more nourishment and calories.  I also explained how pumping consistently will help her produce and maintain a stronger milk supply.  Mother verbalized understanding of plan.  Assisted baby to latch onto the right breast in the cross cradle hold.  Baby latched with flanged lips but was very sleepy.  Demonstrated gentle arousal techniques and he would take 2-3 sucks before stopping.  Showed mother how to do breast compressions and he began feeding somewhat more assertively but only for about 10-20 seconds before tiring.  Gentle stimulation performed and he would take an additional 2-3 sucks.  Suggested we begin supplementing and mother agreeable.  Reviewed LPTI feeding volume guidelines with parents and baby should now be taking 10-20 mls with every feeding.  Upon my gloved finger, baby held his tongue high to his upper palate and needed gentle oral stimulation to suck.  His suck was uncoordinated and  required cheek and jaw support.  After some training he began sucking and finished 15 mls of formula with continued cheek support.  Showed father how to do this so he could try at the next feeding.  Father is very receptive to learning.  Demonstrated effective burping and swaddling.  Reviewed DEBP with mother and #24 flange size is appropriate at this time.  Requested coconut oil from RN for added comfort.  Observed mother pumping for the entire 15 minutes while visiting with family and she was unable to express any colostrum at this time.  Encouraged her to pump every three hours while father does the formula supplementation with baby.  Due to the amount of time it takes baby to feed at this time, I suggested no more than 15 minutes at the breast prior to supplementation.  Parents verbalized understanding.  Mother will call as needed for assistance.  RN updated.   Maternal Data Formula Feeding for Exclusion: No Has patient been taught Hand Expression?: Yes Does the patient have breastfeeding experience prior to this delivery?: No  Feeding Feeding Type: Breast Fed  LATCH Score Latch: Repeated attempts needed to sustain latch, nipple held in mouth throughout feeding, stimulation needed to elicit sucking reflex.  Audible Swallowing: None  Type of Nipple: Inverted  Comfort (Breast/Nipple): Soft / non-tender  Hold (Positioning): Assistance needed to correctly position infant at breast and maintain latch.  LATCH Score: 4  Interventions Interventions: Breast feeding basics reviewed;Assisted with latch;Skin to skin;Breast massage;Hand express;Pre-pump if needed;Breast compression;Hand pump;Shells;Position options;Support pillows;Adjust position;DEBP  Lactation Tools Discussed/Used Pump Review: Setup, frequency, and cleaning(Reviewed)  Consult Status Consult Status: Follow-up Date: 07/10/18 Follow-up type: In-patient    Madilynne Mullan R Junie Engram 07/09/2018, 1:23 PM

## 2018-07-09 NOTE — Lactation Note (Signed)
This note was copied from a baby's chart. Lactation Consultation Note  Patient Name: Paige Neal [id number] Date: 07/09/2018 Reason for consult: Follow-up assessment;Late-preterm 34-36.6wks  Mother asleep: asked father to have RN page me when she awakens.  Father verbalized understanding.  RN updated.   Consult Status Consult Status: Follow-up Date: 07/09/18 Follow-up type: In-patient    Paige Neal 07/09/2018, 10:40 AM

## 2018-07-09 NOTE — Lactation Note (Signed)
This note was copied from a baby's chart. Lactation Consultation Note  Patient Name: Paige Neal [id number] Date: 07/09/2018 Reason for consult: Initial assessment;1st time breastfeeding;Late-preterm 34-36.6wks P2, 12 hour female infant. Per mom, infant has breastfeed 3 times since birth. LC change one large void diaper while in room. LC discussed hand expression and mom taught back colostrum present both breast. LC notice mom has inverted nipples both breast that are compressible and responds to stimulation, LC ask mom-pre-pump breast prior to latching infant.  LC explained how to wear breast shells to help evert nipples out more, mom knows to wear breast shells during the day in bra and not sleep in them at night. Mom latched infant on her left breast using cross cradle hold , after pre-pumping breast, infant latched with wide gape, chin to breast, and sustained latch for 20 minutes. Dad will supplement with 5 ml of similac advance RTF formula afterwards first 24 hours of life after  breastfeeding  (5-10 ml/ per feeding) . Mom plans to use DEBP every 3 hours for 15 minutes to help with breast stimulation and milk induction. LC discussed LPTI behaviors using "Caring For Your Late Preterm Baby" green sheet. Mom knows to breastfeed according cues, 8 or more times within 24 hours. Mom will  not feed baby longer than 30 minutes according LPTI policy and will supplement after feeding at breast with EBM/ or formula according to infant age / hours of life. Parents will continue to do as much STS as possible. LC discussed I & O. Mom knows to call Nurse or LC if she has any questions, concerns or need assistance with latching infant to breast. LC discussed [place] [facility]. Mom made aware of O/P services, breastfeeding support groups, community resources, and our phone # for post-discharge questions.   Maternal Data Formula Feeding for Exclusion: No Has patient  been taught Hand Expression?: Yes(Mom has colostrum present both breast. )  Feeding Feeding Type: Breast Fed Nipple Type: Slow - flow  LATCH Score Latch: Grasps breast easily, tongue down, lips flanged, rhythmical sucking.  Audible Swallowing: A few with stimulation  Type of Nipple: Inverted  Comfort (Breast/Nipple): Soft / non-tender  Hold (Positioning): Assistance needed to correctly position infant at breast and maintain latch.  LATCH Score: 6  Interventions Interventions: Breast feeding basics reviewed;Assisted with latch;Skin to skin;Breast massage;Hand express;Support pillows;Adjust position;Shells;Breast compression;Expressed milk;Pre-pump if needed;Position options;DEBP  Lactation Tools Discussed/Used WIC Program: No Pump Review: Setup, frequency, and cleaning;Milk Storage Initiated by:: by Nurse  Date initiated:: 07/08/18   Consult Status Consult Status: Follow-up Date: 07/09/18 Follow-up type: In-patient    Danelle Earthly 07/09/2018, 1:33 AM

## 2018-07-09 NOTE — Progress Notes (Signed)
Daily Antepartum Note  Admission Date: 07/06/2018 Current Date: 07/09/2018 9:52 AM  Paige Neal is a 38 y.o. T7D2202, PPD#1 SVD/1st (repaired) @ 36/2, admitted for cHTN with superimposed pre-eclampsia with severe features (BP).  Pregnancy complicated by: Patient Active Problem List   Diagnosis Date Noted  . Chronic hypertension with superimposed pre-eclampsia 07/06/2018  . Supervision of high risk pregnancy, antepartum 01/22/2018  . Chronic hypertension in pregnancy 01/22/2018  . AMA (advanced maternal age) multigravida 36+, unspecified trimester 01/22/2018  . Essential hypertension 12/01/2017    Overnight/24hr events:  none  Subjective:  No s/s of pre-eclampsia, meeting all PP goals.   Objective:    Current Vital Signs 24h Vital Sign Ranges  T 99.2 F (37.3 C) Temp  Avg: 98.6 F (37 C)  Min: 98.3 F (36.8 C)  Max: 99.2 F (37.3 C)  BP 136/88 BP  Min: 110/74  Max: 159/101  HR 73 Pulse  Avg: 78.4  Min: 70  Max: 90  RR 17 Resp  Avg: 17.6  Min: 16  Max: 18  SaO2 97 % (Room Air) SpO2  Avg: 98.6 %  Min: 97 %  Max: 100 %       24 Hour I/O Current Shift I/O  Time Ins Outs 05/10 0701 - 05/11 0700 In: 2492.8 [P.O.:750; I.V.:1742.8] Out: 5427 [Urine:2750] 05/11 0701 - 05/11 1900 In: 240 [P.O.:240] Out: 0    Patient Vitals for the past 24 hrs:  BP Temp Temp src Pulse Resp SpO2  07/09/18 0834 136/88 99.2 F (37.3 C) Oral 73 17 -  07/09/18 0700 - - - - 16 -  07/09/18 0600 - - - - 16 -  07/09/18 0500 - - - - 18 -  07/09/18 0404 (!) 148/97 - - 84 18 -  07/09/18 0300 - - - - 16 -  07/09/18 0200 - - - - 17 -  07/09/18 0100 - - - - 17 -  07/09/18 0000 126/82 98.5 F (36.9 C) Oral 75 18 -  07/08/18 2300 - - - - 17 -  07/08/18 2205 - - - - 17 -  07/08/18 2100 - - - - 16 -  07/08/18 1956 139/90 98.3 F (36.8 C) Oral 77 17 97 %  07/08/18 1800 - - - - - 99 %  07/08/18 1700 (!) 142/90 98.4 F (36.9 C) Oral 84 16 99 %  07/08/18 1605 (!) 138/95 - - 82 18 -  07/08/18 1500  (!) 145/96 98.5 F (36.9 C) Oral 77 18 100 %  07/08/18 1400 (!) 149/102 - - 79 18 -  07/08/18 1345 (!) 149/102 - - 78 18 -  07/08/18 1330 (!) 153/100 - - 79 18 -  07/08/18 1315 (!) 146/95 98.4 F (36.9 C) Oral 82 18 -  07/08/18 1306 - - - - - 100 %  07/08/18 1301 - - - - - 98 %  07/08/18 1300 133/87 - - 81 18 -  07/08/18 1256 110/74 - - 90 18 97 %  07/08/18 1255 133/83 - - 82 18 -  07/08/18 1230 (!) 154/92 - - 81 18 -  07/08/18 1200 (!) 150/96 - - 70 18 -  07/08/18 1135 (!) 159/101 - - 80 18 -  07/08/18 1130 (!) 155/97 - - 81 18 -  07/08/18 1125 (!) 145/93 - - 77 18 -  07/08/18 1120 (!) 146/92 - - 72 18 -  07/08/18 1115 132/90 - - 75 18 -  07/08/18 1110 (!) 132/91 - - 74  18 -  07/08/18 1105 131/87 - - 75 18 -  07/08/18 1100 131/87 - - 75 18 -  07/08/18 1056 139/89 - - 80 18 -  07/08/18 1052 (!) 142/87 - - 81 18 -  07/08/18 1050 (!) 141/92 - - 79 18 -  07/08/18 1030 (!) 148/94 - - 79 18 -  07/08/18 1003 137/90 - - 73 18 -    Physical exam: General: Well nourished, well developed female in no acute distress. Abdomen: nttp, firm fundus below the umbilicus Cardiovascular: S1, S2 normal, no murmur, rub or gallop, regular rate and rhythm Respiratory: CTAB Extremities: no clubbing, cyanosis or edema Skin: Warm and dry.   Medications: Current Facility-Administered Medications  Medication Dose Route Frequency Provider Last Rate Last Dose  . acetaminophen (TYLENOL) tablet 650 mg  650 mg Oral Q4H PRN Nicolette Bang, DO      . benzocaine-Menthol (DERMOPLAST) 20-0.5 % topical spray 1 application  1 application Topical PRN Nicolette Bang, DO      . coconut oil  1 application Topical PRN Nicolette Bang, DO      . witch hazel-glycerin (TUCKS) pad 1 application  1 application Topical PRN Nicolette Bang, DO       And  . dibucaine (NUPERCAINAL) 1 % rectal ointment 1 application  1 application Rectal PRN Nicolette Bang, DO      .  diphenhydrAMINE (BENADRYL) capsule 25 mg  25 mg Oral Q6H PRN Nicolette Bang, DO      . ibuprofen (ADVIL) tablet 600 mg  600 mg Oral Q6H Nicolette Bang, DO   600 mg at 07/09/18 8016  . labetalol (NORMODYNE) tablet 300 mg  300 mg Oral BID Nicolette Bang, DO   300 mg at 07/09/18 5537  . lactated ringers infusion   Intravenous Continuous Aletha Halim, MD 75 mL/hr at 07/09/18 0936    . magnesium sulfate 40 grams in LR 500 mL OB infusion  2 g/hr Intravenous Titrated Aletha Halim, MD 25 mL/hr at 07/09/18 0205 2 g/hr at 07/09/18 0205  . measles, mumps & rubella vaccine (MMR) injection 0.5 mL  0.5 mL Subcutaneous Once Nicolette Bang, DO      . ondansetron [facility]) tablet 4 mg  4 mg Oral Q4H PRN Nicolette Bang, DO       Or  . ondansetron [facility]) injection 4 mg  4 mg Intravenous Q4H PRN Nicolette Bang, DO      . polyethylene glycol (MIRALAX / GLYCOLAX) packet 17 g  17 g Oral Daily Aletha Halim, MD   17 g at 07/09/18 0929  . prenatal multivitamin tablet 1 tablet  1 tablet Oral Q1200 Nicolette Bang, DO      . senna-docusate (Senokot-S) tablet 2 tablet  2 tablet Oral QHS PRN Aletha Halim, MD      . simethicone (MYLICON) chewable tablet 80 mg  80 mg Oral TID Melvern Sample, MD      . Tdap Durwin Reges) injection 0.5 mL  0.5 mL Intramuscular Once Nicolette Bang, DO      . zolpidem (AMBIEN) tablet 5 mg  5 mg Oral QHS PRN Nicolette Bang, DO        Labs:  Recent Labs  Lab 07/07/18 1256 07/08/18 1003 07/08/18 1519  WBC 8.8 7.7 12.9*  HGB 12.8 12.9 12.2  HCT 37.7 38.0 36.1  PLT 173 177 176    Recent Labs  Lab 07/06/18 1557  NA 137  K 4.2  CL 102  CO2 23  BUN 10  CREATININE 0.78  CALCIUM 9.0  PROT 6.0*  BILITOT 0.4  ALKPHOS 116  ALT 23  AST 25  GLUCOSE 89     Radiology: none  Assessment & Plan:  Pt doing well *PP: routine care. Breast. D/w pt re: [facility] tomorrow. Pt would  like circ and states newborn has commerical pay. Newborn is in the room with her so should be able to do circ but will d/w peds team since baby is 36/2 weeks. Weight is 2764gm *cHTN with severe pre-x: was on hctz and inderal prior to pregnancy. Will leave on current labetalol dosing due to still some mild range BPs and pt is breastfeeding. Mg to come off today at 1230pm. *PPx: OOB ad lib after mg off *FEN/GI: regular diet *Dispo: likely tomorrow. Request sent to clinic for one week bp check.   Durene Romans MD Attending Center for [facility])

## 2018-07-09 NOTE — Anesthesia Postprocedure Evaluation (Signed)
Anesthesia Post Note  Patient: Lakicia Wigand  Procedure(s) Performed: AN AD HOC LABOR EPIDURAL     Patient location during evaluation: Mother Baby Anesthesia Type: Epidural Level of consciousness: awake, awake and alert and oriented Pain management: pain level controlled Vital Signs Assessment: post-procedure vital signs reviewed and stable Respiratory status: spontaneous breathing, nonlabored ventilation and respiratory function stable Cardiovascular status: stable Postop Assessment: no headache, no backache, patient able to bend at knees, able to ambulate, adequate PO intake and no apparent nausea or vomiting Anesthetic complications: no    Last Vitals:  Vitals:   07/09/18 1149 07/09/18 1532  BP: 132/87 (!) 140/91  Pulse: 83 75  Resp: 16 16  Temp: 36.9 C 37.4 C  SpO2: 95% 98%    Last Pain:  Vitals:   07/09/18 1600  TempSrc:   PainSc: 0-No pain   Pain Goal: Patients Stated Pain Goal: 3 (07/09/18 0800)                 Earon Rivest

## 2018-07-10 MED ORDER — IBUPROFEN 600 MG PO TABS: 600.0000 mg | ORAL_TABLET | Freq: Four times a day (QID) | ORAL | 2 refills | Status: DC | PRN

## 2018-07-10 MED ORDER — LABETALOL HCL 200 MG PO TABS: 400.0000 mg | ORAL_TABLET | Freq: Two times a day (BID) | ORAL | 1 refills | Status: DC

## 2018-07-10 MED ORDER — NORETHINDRONE 0.35 MG PO TABS: 1.0000 | ORAL_TABLET | Freq: Every day | ORAL | 11 refills | Status: DC

## 2018-07-10 MED ORDER — LABETALOL HCL 200 MG PO TABS
400.0000 mg | ORAL_TABLET | Freq: Two times a day (BID) | ORAL | Status: DC
  Administered 2018-07-10: 400 mg via ORAL
  Filled 2018-07-10: qty 2

## 2018-07-10 NOTE — Progress Notes (Signed)
Attending Circumcision Counseling Progress Note  Patient desires circumcision for her female infant.  Circumcision procedure details discussed, risks and benefits of procedure were also discussed.  These include but are not limited to: Benefits of circumcision in men include reduction in the rates of urinary tract infection (UTI), penile cancer, some sexually transmitted infections, penile inflammatory and retractile disorders, as well as easier hygiene.  Risks include bleeding , infection, injury of glans which may lead to penile deformity or urinary tract issues, unsatisfactory cosmetic appearance and other potential complications related to the procedure.  It was emphasized that this is an elective procedure.  Patient wants to proceed with circumcision; written informed consent obtained.  Will do circumcision soon, routine circumcision and post circumcision care ordered for the infant.  Jaynie Collins, M.D. 07/10/2018 9:52 AM

## 2018-07-10 NOTE — Lactation Note (Signed)
This note was copied from a baby's chart. Lactation Consultation Note  Patient Name: Paige Neal [id number] Date: 07/10/2018   Baby 44 hours old.  [redacted]w[redacted]d.   Mother complaining of nipple soreness, no cracks or abrasions. R nipple inverted and smaller in diameter than L nipple.  Mother states nipple is round when baby unlatches and lips are flanged during feeding. She does not feel pinching or biting but is sore. Mother knows to apply ebm and coconut oil for soreness. Encouraged mother to wear shells. For soreness suggest mother apply ebm or coconut oil while wearing shells and alternate with comfort gels. FOB is working on increasing supplemental volume with FO.  Reviewed volume guidelines.  Assisted w/ latching baby in football hold on R breast with #20NS since per mother baby has been unable to sustain latch on L side. Baby was able to sustain latch with intermittent sucks and swallows. Encouraged pumping q 3 hours. Mother states she has manual pump at home. Encouraged her to call her insurance for DEBP and pump q 3 hours. Feed on demand approximately 8-12 times per day at least q 3 hours. Reviewed engorgement care and monitoring voids/stools.    Maternal Data    Feeding Feeding Type: Bottle Fed - Formula Nipple Type: Slow - flow  LATCH Score                   Interventions    Lactation Tools Discussed/Used     Consult Status      Hardie Pulley 07/10/2018, 8:44 AM

## 2018-07-11 ENCOUNTER — Other Ambulatory Visit: Payer: Self-pay

## 2018-07-11 ENCOUNTER — Encounter: Payer: BLUE CROSS/BLUE SHIELD | Admitting: Obstetrics & Gynecology

## 2018-07-11 ENCOUNTER — Encounter (HOSPITAL_COMMUNITY): Payer: Self-pay | Admitting: *Deleted

## 2018-07-11 ENCOUNTER — Inpatient Hospital Stay (HOSPITAL_COMMUNITY)
Admission: EM | Admit: 2018-07-11 | Discharge: 2018-07-11 | Disposition: A | Discharge status: Home / Self Care | Payer: BLUE CROSS/BLUE SHIELD | Attending: Obstetrics and Gynecology | Admitting: Obstetrics and Gynecology

## 2018-07-11 DIAGNOSIS — O119 Pre-existing hypertension with pre-eclampsia, unspecified trimester: Secondary | ICD-10-CM

## 2018-07-11 DIAGNOSIS — M7989 Other specified soft tissue disorders: Secondary | ICD-10-CM | POA: Insufficient documentation

## 2018-07-11 DIAGNOSIS — O165 Unspecified maternal hypertension, complicating the puerperium: Secondary | ICD-10-CM | POA: Insufficient documentation

## 2018-07-11 DIAGNOSIS — Z7982 Long term (current) use of aspirin: Secondary | ICD-10-CM | POA: Insufficient documentation

## 2018-07-11 DIAGNOSIS — Z79899 Other long term (current) drug therapy: Secondary | ICD-10-CM | POA: Insufficient documentation

## 2018-07-11 DIAGNOSIS — O115 Pre-existing hypertension with pre-eclampsia, complicating the puerperium: Secondary | ICD-10-CM

## 2018-07-11 MED ORDER — NIFEDIPINE 10 MG PO CAPS
10.0000 mg | ORAL_CAPSULE | Freq: Once | ORAL | Status: AC
  Administered 2018-07-11: 10 mg via ORAL
  Filled 2018-07-11: qty 1

## 2018-07-11 NOTE — Discharge Instructions (Signed)
How to Take Your Blood Pressure    Blood pressure is a measurement of how strongly your blood is pressing against the walls of your arteries. Arteries are blood vessels that carry blood from your heart throughout your body. Your health care provider takes your blood pressure at each office visit. You can also take your own blood pressure at home with a blood pressure machine. You may need to take your own blood pressure:   To confirm a diagnosis of high blood pressure (hypertension).   To monitor your blood pressure over time.   To make sure your blood pressure medicine is working.    Supplies needed:    To take your blood pressure, you will need a blood pressure machine. You can buy a blood pressure machine, or blood pressure monitor, at most drugstores or online. There are several types of home blood pressure monitors. When choosing one, consider the following:   Choose a monitor that has an arm cuff.   Choose a cuff that wraps snugly around your upper arm. You should be able to fit only one finger between your arm and the cuff.   Do not choose a monitor that measures your blood pressure from your wrist or finger.  Your health care provider can suggest a reliable monitor that will meet your needs.    How to prepare  To get the most accurate reading, avoid the following for 30 minutes before you check your blood pressure:   Drinking caffeine.   Drinking alcohol.   Eating.   Smoking.   Exercising.  Five minutes before you check your blood pressure:   Empty your bladder.   Sit quietly without talking in a dining chair, rather than in a soft couch or armchair.    How to take your blood pressure  To check your blood pressure, follow the instructions in the manual that came with your blood pressure monitor. If you have a digital blood pressure monitor, the instructions may be as follows:  1. Sit up straight.  2. Place your feet on the floor. Do not cross your ankles or legs.  3. Rest your left arm at the  level of your heart on a table or desk or on the arm of a chair.  4. Pull up your shirt sleeve.  5. Wrap the blood pressure cuff around the upper part of your left arm, 1 inch (2.5 cm) above your elbow. It is best to wrap the cuff around bare skin.  6. Fit the cuff snugly around your arm. You should be able to place only one finger between the cuff and your arm.  7. Position the cord inside the groove of your elbow.  8. Press the power button.  9. Sit quietly while the cuff inflates and deflates.  10. Read the digital reading on the monitor screen and write it down (record it).  11. Wait 2-3 minutes, then repeat the steps, starting at step 1.    What does my blood pressure reading mean?  A blood pressure reading consists of a higher number over a lower number. Ideally, your blood pressure should be below 120/80. The first ("top") number is called the systolic pressure. It is a measure of the pressure in your arteries as your heart beats. The second ("bottom") number is called the diastolic pressure. It is a measure of the pressure in your arteries as the heart relaxes.  Blood pressure is classified into four stages. The following are the stages for adults   who do not have a short-term serious illness or a chronic condition. Systolic pressure and diastolic pressure are measured in a unit called mm Hg.    Normal   Systolic pressure: below 120.   Diastolic pressure: below 80.  Elevated   Systolic pressure: 120-129.   Diastolic pressure: below 80.  Hypertension stage 1   Systolic pressure: 130-139.   Diastolic pressure: 80-89.  Hypertension stage 2   Systolic pressure: 140 or above.   Diastolic pressure: 90 or above.  You can have prehypertension or hypertension even if only the systolic or only the diastolic number in your reading is higher than normal.    Follow these instructions at home:   Check your blood pressure as often as recommended by your health care provider.   Take your monitor to the next  appointment with your health care provider to make sure:  ? That you are using it correctly.  ? That it provides accurate readings.   Be sure you understand what your goal blood pressure numbers are.   Tell your health care provider if you are having any side effects from blood pressure medicine.    Contact a health care provider if:   Your blood pressure is consistently high.    Get help right away if:   Your systolic blood pressure is higher than 180.   Your diastolic blood pressure is higher than 110.    This information is not intended to replace advice given to you by your health care provider. Make sure you discuss any questions you have with your health care provider.    Document Released: 07/24/2015 Document Revised: 12/27/2016 Document Reviewed: 07/24/2015  Elsevier Interactive Patient Education  2019 [employer] Inc[employer]

## 2018-07-11 NOTE — MAU Provider Note (Signed)
History     CSN: [id number]  Arrival date and time: 07/11/18 7017   First Provider Initiated Contact with Patient 07/11/18 0322      Chief Complaint  Patient presents with  . Hypertension   Paige Neal is a 38 y.o. G2P1102 at 3 days PP s/p SVD.  She was induced for SI PreE and presents this morning for Hypertension. She reports that she was discharged yesterday and did not have her Labetalol that was changed from 300mg  to 400mg .  However, she reports that she did have 300mg  and took this upon getting home at 7 pm.  She states she later started to experience some swelling of her hands and took her bp.  She reports it was 170s/100s and she then took her 400mg  dosage of Labetalol, at 0120, with resolution of the swelling.  Patient denies HA, visual disturbances, or SOB.  She received MgSO4 while in labor and 24 hours post-delivery.      OB History    Gravida  2   Para  2   Term  1   Preterm  1   AB  0   Living  2     SAB      TAB      Ectopic      Multiple  0   Live Births  2           Past Medical History:  Diagnosis Date  . HTN (hypertension)   . Hyperemesis gravidarum   . Panic attack   . Tachycardia     Past Surgical History:  Procedure Laterality Date  . BREAST FIBROADENOMA SURGERY    . gardisil x 3 - 08/30/2005      Family History  Problem Relation Age of Onset  . Coronary artery disease Mother   . Hypertension Mother   . Hypertension Father   . Hypertension Sister     Social History   Tobacco Use  . Smoking status: Never Smoker  . Smokeless tobacco: Never Used  Substance Use Topics  . Alcohol use: No  . Drug use: No    Allergies: No Known Allergies  Medications Prior to Admission  Medication Sig Dispense Refill Last Dose  . aspirin EC 81 MG tablet Take 1 tablet (81 mg total) by mouth daily. Take after 12 weeks for prevention of preeclampsia later in pregnancy (Patient taking differently: Take 81 mg by mouth every evening.  Take after 12 weeks for prevention of preeclampsia later in pregnancy) 300 tablet 2 07/10/2018 at Unknown time  . ferrous sulfate 325 (65 FE) MG tablet Take 1 tablet (325 mg total) by mouth daily with breakfast. 30 tablet 3 Past Week at Unknown time  . prenatal vitamin w/FE, FA (PRENATAL 1 + 1) 27-1 MG TABS tablet Take 2 tablets by mouth daily at 12 noon.    Past Week at Unknown time  . ibuprofen (ADVIL) 600 MG tablet Take 1 tablet (600 mg total) by mouth every 6 (six) hours as needed for moderate pain or cramping. 30 tablet 2   . labetalol (NORMODYNE) 200 MG tablet Take 2 tablets (400 mg total) by mouth 2 (two) times daily. 120 tablet 1 07/11/2018 at 0120  . norethindrone (MICRONOR) 0.35 MG tablet Take 1 tablet (0.35 mg total) by mouth daily. 1 Package 11     Review of Systems  Constitutional: Negative for chills and fever.  Respiratory: Negative for cough and shortness of breath.   Gastrointestinal: Negative for abdominal pain, constipation, diarrhea,  nausea and vomiting.  Genitourinary: Positive for vaginal bleeding. Negative for dysuria and vaginal discharge.  Musculoskeletal: Negative for back pain.  Neurological: Positive for headaches ("Faint..1/10" ). Negative for dizziness and light-headedness.   Physical Exam   Blood pressure (!) 171/100, pulse 72, temperature 98.8 F (37.1 C), temperature source Oral, resp. rate 20, height 5\' 6"  (1.676 m), weight 68.9 kg, last menstrual period 10/27/2017, SpO2 98 %, unknown if currently breastfeeding. Vitals:   07/11/18 0408 07/11/18 0428  BP: (!) 156/95 112/68  Pulse: 68 (!) 105  Resp:    Temp:    SpO2:      Physical Exam  Constitutional: She is oriented to person, place, and time. She appears well-developed and well-nourished.  HENT:  Head: Normocephalic and atraumatic.  Eyes: Conjunctivae are normal.  Neck: Normal range of motion.  Cardiovascular: Normal rate, regular rhythm and normal heart sounds.  Respiratory: Effort normal and  breath sounds normal.  GI: Soft. Bowel sounds are normal. There is no abdominal tenderness.  Fundus: U/-2  Musculoskeletal: Normal range of motion.        General: Edema (BLE, +2) present.  Neurological: She is alert and oriented to person, place, and time.  Skin: Skin is warm and dry.  Psychiatric: She has a normal mood and affect. Her behavior is normal.    MAU Course  Procedures  MDM Physical exam Antihypertensives  Assessment and Plan  38 year old Postpartum  cHTN  -Exam findings discussed. -Informed that swelling of hands most likely from fluid shifts and not entirely blood pressure. -Discussed need for medication compliance and proper dosing. -Will give one dose procardia 10mg  now and reassess bp q 20 min x 3 -Offered and declines pain medication.  -Patient without questions or concerns.  Follow Up (4:57 AM)  -Significant improvement in bp. -Patient reports headache is increasing and now 3/10. -Patient unsure of last time she slept. -Encouraged to rest upon going home.  -Instructed to take 400mg  of Labetalol tonight and then every 12 hours. Further instructed to check blood pressure prior to dosing.  -Given bp parameters and told to contact clinic if systolic >160 and/or diastolic >100. -Discussed need for potentially increasing Labetalol to 600mg  daily if no improvement in blood pressures at home.  -Will send note to clinic for scheduling of bp check at end of week or early next week. -Patient instructed to take bp once tonight and then daily until next visit.  -Instructions written on discharge paperwork, by nurse, explaining dosing, bp checks, and bp parameters. -No questions or concerns.  -Encouraged to call or return to MAU if symptoms worsen or with the onset of new symptoms. -Discharged to home in improved condition.  Cherre RobinsJessica L Regie Bunner MSN, CNM 07/11/2018, 3:22 AM

## 2018-07-11 NOTE — MAU Note (Signed)
PT SAYS SHE DEL VAG ON  Sunday 5-10- THEN WENT HOME ON Tuesday.  AT HOME - THOUGHT HER HANDS WERE SWELLING.  PNC WITH  HR.    BREAST  FEEDING.

## 2018-07-12 ENCOUNTER — Ambulatory Visit (HOSPITAL_COMMUNITY): Payer: BLUE CROSS/BLUE SHIELD

## 2018-07-13 ENCOUNTER — Other Ambulatory Visit: Payer: Self-pay

## 2018-07-13 ENCOUNTER — Ambulatory Visit (INDEPENDENT_AMBULATORY_CARE_PROVIDER_SITE_OTHER): Payer: BLUE CROSS/BLUE SHIELD

## 2018-07-13 DIAGNOSIS — Z0130 Encounter for examination of blood pressure without abnormal findings: Secondary | ICD-10-CM

## 2018-07-13 NOTE — Progress Notes (Signed)
Pt here today for BP check s/p SVD with elevated BP and started on Labetalol.  Pt reports that she last took Labetalol 400 mg po bid dosage at 0800.  Pt denies any HTN sx's.   BP 148/98.  Notified Dr. Alysia Penna who recommended that the pt start taking Labetalol 400 mg po tid instead of bid and to schedule another BP check on Mon or Tues.  Notiifed pt of provider's recommendation.  Pt agreed and scheduled BP check for 07/17/18 @ 1420.

## 2018-07-13 NOTE — Progress Notes (Signed)
Agree with A & P. 

## 2018-07-16 ENCOUNTER — Telehealth: Payer: Self-pay | Admitting: Obstetrics & Gynecology

## 2018-07-16 NOTE — Telephone Encounter (Signed)
Called patient to make sure she understood her visit would be MyChart video. She is going to download the app.

## 2018-07-17 ENCOUNTER — Other Ambulatory Visit: Payer: Self-pay

## 2018-07-17 ENCOUNTER — Telehealth: Payer: BLUE CROSS/BLUE SHIELD

## 2018-07-17 VITALS — BP 140/93

## 2018-07-17 DIAGNOSIS — Z0130 Encounter for examination of blood pressure without abnormal findings: Secondary | ICD-10-CM

## 2018-07-17 NOTE — Progress Notes (Signed)
I connected with  Paige Neal on 07/17/18 at  2:20 PM EDT by telephone and verified that I am speaking with the correct person using two identifiers.   I discussed the limitations, risks, security and privacy concerns of performing an evaluation and management service by telephone and the availability of in person appointments. I also discussed with the patient that there may be a patient responsible charge related to this service. The patient expressed understanding and agreed to proceed.  Pt obtained BP.  Values are 146/103 RA and LA 140/93.  Pt reports that she has mild headaches but nothing severe.  She last took her BP medication btwn 1300 and 1400.  Notified Dr. Marice Potter who recommends pt continue to take medication as prescribed and to f/u with another BP check in one week.  Pt stated that she will be able to come in on 07/25/18 @ 0930 for virtual BP check.  Front office notified.  Ralene Bathe, RN 07/17/2018  3:53 PM

## 2018-07-20 NOTE — Progress Notes (Signed)
I have reviewed this chart and agree with the RN/CMA assessment and management.    Dore Oquin C [place], MD, FACOG Attending Physician, [facility]   

## 2018-07-25 ENCOUNTER — Telehealth: Payer: Self-pay | Admitting: Lactation Services

## 2018-07-25 ENCOUNTER — Telehealth: Payer: Self-pay | Admitting: General Practice

## 2018-07-25 NOTE — Telephone Encounter (Signed)
Pt called and left message on nurse voicemail.   She is requesting a nurse call her back to discuss her BP. Routed to Clinical Pool for follow up .

## 2018-07-25 NOTE — Telephone Encounter (Signed)
Called patient for BP check. Patient reports taking labetalol 400mg  TID as prescribed. Patient reports some occasional headaches but denies blurry vision or dizziness. Patient states headaches could be due to lack of sleep but she isn't sure. Asked patient to check BP and she reports 158/102 in left arm- took medication at 2pm. Patient states her BP was much better this morning. Reports taking medication at 6am and at 9am BP was 127/85. Discussed with Dr Alysia Penna who states patient should come into the office for check in office. Offered appt tomorrow in office at 10am to patient. Patient verbalized understanding and states she will be here then for BP check. Patient states she hasn't started OCPs yet because she is worried about how it will affect her BP. Patient states she has heard different things on whether or not it is safe. Discussed with patient she was given Rx for progesterone only pill. Told patient that is safe for her BP and it is the pills that have estrogen in them that are concerning to [place]. Discussed Dr Macon Large wouldn't have prescribed anything that wasn't safe with her BP. Patient verbalized understanding & asked how to take Rx. Reviewed medication instructions with patient and what to do if she misses a dose. Patient verbalized understanding to all & had no questions.

## 2018-07-26 ENCOUNTER — Other Ambulatory Visit: Payer: Self-pay

## 2018-07-26 ENCOUNTER — Ambulatory Visit (INDEPENDENT_AMBULATORY_CARE_PROVIDER_SITE_OTHER): Payer: BLUE CROSS/BLUE SHIELD

## 2018-07-26 VITALS — BP 132/84

## 2018-07-26 DIAGNOSIS — Z0130 Encounter for examination of blood pressure without abnormal findings: Secondary | ICD-10-CM

## 2018-07-26 NOTE — Progress Notes (Signed)
Pt here today for BP check s/p pt reporting elevated BP on 07/25/18.  Pt BP 132/84 manually LA.  Notified Dr. Alysia Penna who recommended pt to come back in two weeks for BP check due to pp visit not scheduled until 08/20/18 and continue to take BP medication as prescribed.  Notified pt provider's recommendation.  Pt agreed and walked to front office to schedule BP visit.

## 2018-07-26 NOTE — Progress Notes (Signed)
Agree with A & P. 

## 2018-07-30 ENCOUNTER — Other Ambulatory Visit: Payer: Self-pay | Admitting: General Practice

## 2018-07-30 DIAGNOSIS — O10919 Unspecified pre-existing hypertension complicating pregnancy, unspecified trimester: Secondary | ICD-10-CM

## 2018-07-30 MED ORDER — LABETALOL HCL 200 MG PO TABS: 400.0000 mg | ORAL_TABLET | Freq: Three times a day (TID) | ORAL | 1 refills | Status: DC

## 2018-08-08 ENCOUNTER — Telehealth: Payer: Self-pay | Admitting: Family Medicine

## 2018-08-08 NOTE — Telephone Encounter (Signed)
Spoke with patient about her appointment 6/11 @ 10:20. Patient was instructed to wear a face mask for the entire appointment and no visitors are allowed with her. Patient was screened for covid symptoms and denied having any.

## 2018-08-09 ENCOUNTER — Ambulatory Visit (INDEPENDENT_AMBULATORY_CARE_PROVIDER_SITE_OTHER): Payer: BC Managed Care – PPO

## 2018-08-09 ENCOUNTER — Other Ambulatory Visit: Payer: Self-pay

## 2018-08-09 VITALS — BP 122/78

## 2018-08-09 DIAGNOSIS — Z0130 Encounter for examination of blood pressure without abnormal findings: Secondary | ICD-10-CM

## 2018-08-09 NOTE — Progress Notes (Signed)
Pt here today for BP check.  BP 122/78 manually LA.  Pt denies any sx's.  Reported BP value to Dr. Hulan Fray, recommendation to continue medication regimen and f/u at pp visit.  Notified pt provider's recommendation and to continue to monitor for sx of HTN.  I also advised that the pt have BP cuff ready for her pp visit.  Pt verbalized understanding with no further questions.  Mel Almond, RN 08/09/18

## 2018-08-20 ENCOUNTER — Other Ambulatory Visit: Payer: Self-pay

## 2018-08-20 ENCOUNTER — Telehealth (INDEPENDENT_AMBULATORY_CARE_PROVIDER_SITE_OTHER): Payer: BC Managed Care – PPO | Admitting: Obstetrics & Gynecology

## 2018-08-20 NOTE — Progress Notes (Signed)
I connected with  Arlyss Represshristina Burchell on 08/20/18 at 10:55 AM EDT by telephone and verified that I am speaking with the correct person using two identifiers.   I discussed the limitations, risks, security and privacy concerns of performing an evaluation and management service by telephone and the availability of in person appointments. I also discussed with the patient that there may be a patient responsible charge related to this service. The patient expressed understanding and agreed to proceed.  Osvaldo Humanshley P Eisma, RN 08/20/2018  10:54 AM   Postpartum Visit Patient is here for a postpartum visit. She is 6 weeks postpartum following a spontaneous vaginal delivery. I have fully reviewed the prenatal and intrapartum course. The delivery was at [id number] gestational weeks. Outcome: spontaneous vaginal delivery. Anesthesia: epidural.  Postpartum course has been unremarkable. Baby's course has been uncomplicated. Baby is feeding by both breast and bottle - Similac Sensitive. Bleeding thin lochia. Bowel function is normal. Bladder function is normal. Patient is not sexually active. Contraception method is OCP (estrogen/progesterone) and oral progesterone-only contraceptive. Postpartum depression screening: negative     TELEHEALTH POSTPARTUM VIRTUAL VIDEO VISIT ENCOUNTER NOTE  [facility]   I connected with Arlyss Represshristina Burchell on 08/20/18 at 10:55 AM EDT by MyChart Video Encounter at home and verified that I am speaking with the correct person using two identifiers.    I discussed the limitations, risks, security and privacy concerns of performing an evaluation and management service by telephone and the availability of in person appointments. I also discussed with the patient that there may be a patient responsible charge related to this service. The patient expressed understanding and agreed to proceed.  Appointment Date: 08/20/2018  [facility]  Chief Complaint: Postpartum Visit  She has a BP cuff at home. Her BP today is 131/88.   Patient Active Problem List   Diagnosis Date Noted  . Chronic hypertension with superimposed pre-eclampsia 07/06/2018  . Supervision of high risk pregnancy, antepartum 01/22/2018  . Chronic hypertension in pregnancy 01/22/2018  . AMA (advanced maternal age) multigravida 35+, unspecified trimester 01/22/2018  . Essential hypertension 12/01/2017    Medications Arlyss RepressChristina Burchell had no medications administered during this visit. Current Outpatient Medications  Medication Sig Dispense Refill  . ferrous sulfate 325 (65 FE) MG tablet Take 1 tablet (325 mg total) by mouth daily with breakfast. 30 tablet 3  . ibuprofen (ADVIL) 600 MG tablet Take 1 tablet (600 mg total) by mouth every 6 (six) hours as needed for moderate pain or cramping. 30 tablet 2  . labetalol (NORMODYNE) 200 MG tablet Take 2 tablets (400 mg total) by mouth 3 (three) times daily. 180 tablet 1  . norethindrone (MICRONOR) 0.35 MG tablet Take 1 tablet (0.35 mg total) by mouth daily. 1 Package 11  . prenatal vitamin w/FE, FA (PRENATAL 1 + 1) 27-1 MG TABS tablet Take 2 tablets by mouth daily at 12 noon.      No current facility-administered medications for this visit.     Allergies Patient has no known allergies.  Physical Exam:  LMP 10/27/2017 (Exact Date)  Reviewed from [employer] General:  Alert, oriented and cooperative. Patient is in no acute distress.  Mental Status: Normal mood and affect. Normal behavior. Normal judgment and thought content.   Respiratory: Normal respiratory effort noted, no problems with respiration noted  Rest of physical exam deferred due to type of encounter  PP Depression Screening:   Edinburgh Postnatal Depression Scale - 08/20/18  [place] Postnatal Depression Scale:  In the Past 7 Days   I have been able to laugh and see the funny side of things.  0    I have looked forward with  enjoyment to things.  0    I have blamed myself unnecessarily when things went wrong.  0    I have been anxious or worried for no good reason.  0    I have felt scared or panicky for no good reason.  0    Things have been getting on top of me.  0    I have been so unhappy that I have had difficulty sleeping.  0    I have felt sad or miserable.  1    I have been so unhappy that I have been crying.  0    The thought of harming myself has occurred to me.  0    Edinburgh Postnatal Depression Scale Total  1       Assessment:Patient is a 38 y.o. G25P1102 (8 yo son and 65 week old son)  who is  weeks postpartum and is doing well.   Plan: Postpartum state- stable She will call/send a message when she switches to bottle feeding so she can change to a combination OCPs if her BP is well controlled.   She is aware that her next pap will be due 2022. She will need to see her fam med doc at [facility] regarding her BP.    I discussed the assessment and treatment plan with the patient. The patient was provided an opportunity to ask questions and all were answered. The patient agreed with the plan and demonstrated an understanding of the instructions.   The patient was advised to call back or seek an in-person evaluation/go to the ED for any concerning postpartum symptoms.  I provided 10 minutes of face-to-face time during this encounter.   Emily Filbert, MD [facility], [place]

## 2018-08-22 ENCOUNTER — Other Ambulatory Visit: Payer: Self-pay | Admitting: Obstetrics and Gynecology

## 2018-09-12 ENCOUNTER — Encounter: Payer: Self-pay | Admitting: Family Medicine

## 2018-09-14 ENCOUNTER — Ambulatory Visit (INDEPENDENT_AMBULATORY_CARE_PROVIDER_SITE_OTHER): Payer: BC Managed Care – PPO | Admitting: Family Medicine

## 2018-09-14 ENCOUNTER — Other Ambulatory Visit: Payer: Self-pay

## 2018-09-14 ENCOUNTER — Encounter: Payer: Self-pay | Admitting: Family Medicine

## 2018-09-14 VITALS — BP 142/96 | HR 91 | Wt 133.0 lb

## 2018-09-14 DIAGNOSIS — N949 Unspecified condition associated with female genital organs and menstrual cycle: Secondary | ICD-10-CM | POA: Diagnosis not present

## 2018-09-14 DIAGNOSIS — N63 Unspecified lump in unspecified breast: Secondary | ICD-10-CM

## 2018-09-14 DIAGNOSIS — I10 Essential (primary) hypertension: Secondary | ICD-10-CM

## 2018-09-14 NOTE — Progress Notes (Signed)
Subjective: Chief Complaint  Patient presents with  . Hypertension     HPI: Paige Neal is a 38 y.o. presenting to clinic today to discuss the following:  1  Breast Lump In left breast, unsure of how long it has been there, but first noted about 1 week ago.  Stated that at that time it was larger, nontender, improved after pumping.  No fevers, chills.  States that it is now only a small bump.  Denies any redness, discharge.  Patient is 8 weeks postpartum and is currently breast-feeding.  2 Vaginal "bubbles" Since she was pregnant, patient has noticed what she describes as "vaginal bubbles".  She states that they look like small hemorrhoids around her vagina.  They have not changed in size to her knowledge, are not tender to palpation.  She states that she is unable to feel them, but has seen them with a mirror.  Denies any change in her vaginal discharge, concern for STDs.  Would like for someone to look at them, as she is unsure what they are.  3 HTN History of chronic hypertension, on labetalol from OB/GYN.  Has been checking blood pressure at home, has some values with 809X systolic, others with 833A systolic and lower.  Reports compliance with her medication.     ROS noted in HPI.   Past Medical, Surgical, Social, and Family History Reviewed & Updated per EMR.   Pertinent Historical Findings include:   Social History   Tobacco Use  Smoking Status Never Smoker  Smokeless Tobacco Never Used      Objective: BP (!) 142/96   Pulse 91   Wt 133 lb (60.3 kg)   SpO2 98%   BMI 21.47 kg/m  Vitals and nursing notes reviewed  Physical Exam:  General: 37 y.o. female in NAD Cardio: RRR no m/r/g Breast: 1.5 cm soft, mobile mass in left breast at 2 o' clock, no tenderness to palpation,, no overlying skin changes, remainder of left breast and right breast without obvious masses or lesions, right nipple inverted Lungs: CTAB, no wheezing, no rhonchi, no crackles, no IWOB  on RA Abdomen: Soft, non-tender to palpation, non-distended, positive bowel sounds Skin: warm and dry Extremities: No edema Pelvic exam performed with patient supine.  Chaperone in room.  Bilateral labia without abnormalities, note small sebaceous glands at vaginal opening, no inguinal LAD palpated.  Patient declined speculum exam due to comfort   No results found for this or any previous visit (from the past 72 hour(s)).  Assessment/Plan:  Breast mass Left breast.  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  Will order [place] to further assess.  Vaginal lump Patient reassured that what she observed on her self-exam were sebaceous glands.  No evidence of bartholin cyst or vaginal lesions.  Essential hypertension BP rechecked during exam, was 126/78.  Continue current regimen.  Follow up with PCP in 1 month for BP.     PATIENT EDUCATION PROVIDED: See AVS    Diagnosis and plan along with any newly prescribed medication(s) were discussed in detail with this patient today. The patient verbalized understanding and agreed with the plan. Patient advised if symptoms worsen return to clinic or ER.     Orders Placed This Encounter  Procedures  . US BREAST COMPLETE UNI LEFT INC AXILLA    Standing Status:   Future    Standing Expiration Date:  11/15/2019    Order Specific Question:   Reason for Exam (SYMPTOM  OR DIAGNOSIS REQUIRED)    Answer:   left breast mass while breast feeding    Order Specific Question:   Preferred imaging location?    Answer:   [facility]    No orders of the defined types were placed in this encounter.    Luis AbedBailey Charmane Protzman, DO 09/16/2018, 4:25 PM PGY-2 [facility]

## 2018-09-14 NOTE — Patient Instructions (Signed)
Thank you for coming to see me today. It was a pleasure. Today we talked about:   Your breast: I have ordered an Ultrasound.  If you don't hear from them in 1 week call [phone] ([place]).  Everything looked normal on exam.   If you have any questions or concerns, please do not hesitate to call the office at [phone].  Best,   [place] Constable, DO

## 2018-09-16 DIAGNOSIS — N949 Unspecified condition associated with female genital organs and menstrual cycle: Secondary | ICD-10-CM | POA: Insufficient documentation

## 2018-09-16 HISTORY — DX: Unspecified condition associated with female genital organs and menstrual cycle: N94.9

## 2018-09-16 NOTE — Assessment & Plan Note (Signed)
Left breast.  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  Will order [place] to further assess.

## 2018-09-16 NOTE — Assessment & Plan Note (Addendum)
BP rechecked during exam, was 126/78.  Continue current regimen.  Follow up with PCP in 1 month for BP.

## 2018-09-16 NOTE — Assessment & Plan Note (Signed)
Patient reassured that what she observed on her self-exam were sebaceous glands.  No evidence of bartholin cyst or vaginal lesions.

## 2018-09-20 ENCOUNTER — Other Ambulatory Visit: Payer: Self-pay | Admitting: Family Medicine

## 2018-09-20 DIAGNOSIS — N63 Unspecified lump in unspecified breast: Secondary | ICD-10-CM

## 2018-09-22 ENCOUNTER — Other Ambulatory Visit: Payer: Self-pay | Admitting: Obstetrics and Gynecology

## 2018-09-22 DIAGNOSIS — O10919 Unspecified pre-existing hypertension complicating pregnancy, unspecified trimester: Secondary | ICD-10-CM

## 2018-09-28 ENCOUNTER — Ambulatory Visit
Admission: RE | Admit: 2018-09-28 | Discharge: 2018-09-28 | Disposition: A | Discharge status: Home / Self Care | Payer: BC Managed Care – PPO | Source: Ambulatory Visit | Attending: Family Medicine | Admitting: Family Medicine

## 2018-09-28 ENCOUNTER — Other Ambulatory Visit: Payer: Self-pay

## 2018-09-28 DIAGNOSIS — N63 Unspecified lump in unspecified breast: Secondary | ICD-10-CM

## 2018-10-02 ENCOUNTER — Telehealth: Payer: Self-pay

## 2018-10-02 NOTE — Telephone Encounter (Signed)
-----   Message from Cleophas Dunker, DO sent at 10/01/2018  5:06 PM EDT ----- Please call patient and let her know that her imaging was normal.  If area gets larger, she should be seen again.

## 2018-10-02 NOTE — Telephone Encounter (Signed)
Pt informed of information below. Hallis Meditz T Shanise Balch, CMA  

## 2018-10-02 NOTE — Telephone Encounter (Signed)
-----   Message from Bailey J Meccariello, DO sent at 10/01/2018  5:06 PM EDT ----- Please call patient and let her know that her imaging was normal.  If area gets larger, she should be seen again. 

## 2018-10-04 ENCOUNTER — Encounter: Payer: Self-pay | Admitting: Family Medicine

## 2018-10-14 ENCOUNTER — Other Ambulatory Visit: Payer: Self-pay | Admitting: Obstetrics & Gynecology

## 2018-10-14 DIAGNOSIS — O10919 Unspecified pre-existing hypertension complicating pregnancy, unspecified trimester: Secondary | ICD-10-CM

## 2018-10-15 ENCOUNTER — Telehealth: Payer: Self-pay | Admitting: Family Medicine

## 2018-10-15 NOTE — Telephone Encounter (Signed)
Work restrictions form dropped off for at front desk for completion.  Verified that patient section of form has been completed.  Last DOS/WCC with PCP was 09/14/18.  Placed form in team folder to be completed by clinical staff.  Crista Luria

## 2018-10-15 NOTE — Telephone Encounter (Signed)
Reviewed form and placed in PCP's box for completion.  .Michelle R Simpson, CMA  

## 2018-10-16 ENCOUNTER — Encounter: Payer: Self-pay | Admitting: Family Medicine

## 2018-10-17 NOTE — Telephone Encounter (Signed)
Form completed and placed in RN box. Thank you!   Best,  Patriciaann Clan, DO

## 2018-10-18 ENCOUNTER — Other Ambulatory Visit: Payer: Self-pay

## 2018-10-18 DIAGNOSIS — O10919 Unspecified pre-existing hypertension complicating pregnancy, unspecified trimester: Secondary | ICD-10-CM

## 2018-10-18 MED ORDER — LABETALOL HCL 200 MG PO TABS: 400.0000 mg | ORAL_TABLET | Freq: Three times a day (TID) | ORAL | 0 refills | Status: DC

## 2018-10-18 NOTE — Telephone Encounter (Signed)
Patient informed her form is ready for pick up

## 2018-11-12 ENCOUNTER — Other Ambulatory Visit: Payer: Self-pay | Admitting: Cardiology

## 2018-11-12 DIAGNOSIS — O10919 Unspecified pre-existing hypertension complicating pregnancy, unspecified trimester: Secondary | ICD-10-CM

## 2018-11-13 ENCOUNTER — Ambulatory Visit (INDEPENDENT_AMBULATORY_CARE_PROVIDER_SITE_OTHER): Payer: BC Managed Care – PPO | Admitting: *Deleted

## 2018-11-13 ENCOUNTER — Other Ambulatory Visit: Payer: Self-pay

## 2018-11-13 DIAGNOSIS — Z23 Encounter for immunization: Secondary | ICD-10-CM | POA: Diagnosis not present

## 2018-11-19 ENCOUNTER — Telehealth: Payer: Self-pay

## 2018-11-19 DIAGNOSIS — O10919 Unspecified pre-existing hypertension complicating pregnancy, unspecified trimester: Secondary | ICD-10-CM

## 2018-11-19 MED ORDER — LABETALOL HCL 200 MG PO TABS: 200.0000 mg | ORAL_TABLET | Freq: Three times a day (TID) | ORAL | 0 refills | Status: DC

## 2018-11-19 NOTE — Telephone Encounter (Signed)
Patient called about refill for labetalol sent to [facility]

## 2018-11-21 ENCOUNTER — Ambulatory Visit (INDEPENDENT_AMBULATORY_CARE_PROVIDER_SITE_OTHER): Payer: BC Managed Care – PPO | Admitting: Cardiology

## 2018-11-21 ENCOUNTER — Other Ambulatory Visit: Payer: Self-pay

## 2018-11-21 ENCOUNTER — Encounter: Payer: Self-pay | Admitting: Cardiology

## 2018-11-21 VITALS — BP 120/80 | HR 74 | Temp 98.1°F | Ht 66.0 in | Wt 129.0 lb

## 2018-11-21 DIAGNOSIS — I10 Essential (primary) hypertension: Secondary | ICD-10-CM

## 2018-11-21 DIAGNOSIS — O10919 Unspecified pre-existing hypertension complicating pregnancy, unspecified trimester: Secondary | ICD-10-CM | POA: Diagnosis not present

## 2018-11-21 MED ORDER — ALPRAZOLAM 0.25 MG PO TABS: ORAL_TABLET | ORAL | 0 refills | Status: DC

## 2018-11-21 MED ORDER — LABETALOL HCL 200 MG PO TABS: 400.0000 mg | ORAL_TABLET | Freq: Three times a day (TID) | ORAL | 0 refills | Status: DC

## 2018-11-21 NOTE — Patient Instructions (Signed)
Medication Instructions:  Your physician recommends that you continue on your current medications as directed. Please refer to the Current Medication list given to you today.  If you need a refill on your cardiac medications before your next appointment, please call your pharmacy.   Lab work: NONE If you have labs (blood work) drawn today and your tests are completely normal, you will receive your results only by: . MyChart Message (if you have MyChart) OR . A paper copy in the mail If you have any lab test that is abnormal or we need to change your treatment, we will call you to review the results.  Testing/Procedures: You had an EKG performed today.  Follow-Up: At [facility], you and your health needs are our priority.  As part of our continuing mission to provide you with exceptional heart care, we have created designated Provider Care Teams.  These Care Teams include your primary Cardiologist (physician) and Advanced Practice Providers (APPs -  Physician Assistants and Nurse Practitioners) who all work together to provide you with the care you need, when you need it. You will need a follow up appointment in 1 months.    

## 2018-11-21 NOTE — Addendum Note (Signed)
Addended by: Beckey Rutter on: 11/21/2018 10:49 AM   Modules accepted: Orders

## 2018-11-21 NOTE — Progress Notes (Signed)
Cardiology Office Note:    Date:  11/21/2018   ID:  Paige Neal, DOB 01/11/1981, MRN [id number]  PCP:  Patriciaann Clan, DO  Cardiologist:  Jenean Lindau, MD   Referring MD: Patriciaann Clan, DO    ASSESSMENT:    1. Essential hypertension    PLAN:    In order of problems listed above:  1. Primary prevention stressed with the patient.  Importance of compliance with diet and medication stressed and she vocalized understanding. 2. Essential hypertension: Her blood pressure stable.  Salt intake issues were discussed.  Importance of regular exercise stressed.  I will give her Xanax 0.125 mg 15 tablets with no refills.  She gets anxious and stressed up sometimes at work.  She works from home.  Sometimes she feels her blood pressures going up and I told her to use this carefully.  Precautions explained and she vocalized understanding follow-up appointment in a month or earlier if she has any concerns.  She knows to go to the nearest emergency room for any concerning symptoms.   Medication Adjustments/Labs and Tests Ordered: Current medicines are reviewed at length with the patient today.  Concerns regarding medicines are outlined above.  No orders of the defined types were placed in this encounter.  No orders of the defined types were placed in this encounter.    Chief Complaint  Patient presents with  . Follow-up     History of Present Illness:    Paige Neal is a 38 y.o. female patient has history of essential hypertension.  She denies any problems at this time and takes care of activities of daily living.  No chest pain orthopnea or PND.  She is gone to the emergency room once or twice with high blood pressure.  No chest pain orthopnea or PND.  At the time of my evaluation, the patient is alert awake oriented and in no distress.  Past Medical History:  Diagnosis Date  . HTN (hypertension)   . Hyperemesis gravidarum   . Panic attack   . Tachycardia      Past Surgical History:  Procedure Laterality Date  . BREAST EXCISIONAL BIOPSY Bilateral   . BREAST FIBROADENOMA SURGERY    . gardisil x 3 - 08/30/2005      Current Medications: Current Meds  Medication Sig  . ferrous sulfate 325 (65 FE) MG tablet TAKE 1 TABLET BY MOUTH EVERY DAY WITH BREAKFAST  . labetalol (NORMODYNE) 200 MG tablet Take 1 tablet (200 mg total) by mouth 3 (three) times daily.  . norethindrone (MICRONOR) 0.35 MG tablet Take 1 tablet (0.35 mg total) by mouth daily.  . prenatal vitamin w/FE, FA (PRENATAL 1 + 1) 27-1 MG TABS tablet Take 2 tablets by mouth daily at 12 noon.      Allergies:   Patient has no known allergies.   Social History   Socioeconomic History  . Marital status: Married    Spouse name: Not on file  . Number of children: Not on file  . Years of education: Not on file  . Highest education level: Not on file  Occupational History  . Not on file  Social Needs  . Financial resource strain: Not on file  . Food insecurity    Worry: Never true    Inability: Never true  . Transportation needs    Medical: No    Non-medical: No  Tobacco Use  . Smoking status: Never Smoker  . Smokeless tobacco: Never Used  Substance and Sexual  Activity  . Alcohol use: No  . Drug use: No  . Sexual activity: Yes    Birth control/protection: None  Lifestyle  . Physical activity    Days per week: Not on file    Minutes per session: Not on file  . Stress: Not on file  Relationships  . Social Musician on phone: Not on file    Gets together: Not on file    Attends religious service: Not on file    Active member of club or organization: Not on file    Attends meetings of clubs or organizations: Not on file    Relationship status: Not on file  Other Topics Concern  . Not on file  Social History Narrative   Pt lives in [place] and works at [employer],  single     Family History: The patient's family history includes Coronary artery disease in her  mother; Hypertension in her father, mother, and sister.  ROS:   Please see the history of present illness.    All other systems reviewed and are negative.  EKGs/Labs/Other Studies Reviewed:    The following studies were reviewed today: I reviewed blood pressure records from previous visits.   Recent Labs: 07/06/2018: ALT 23; BUN 10; Creatinine, Ser 0.78; Potassium 4.2; Sodium 137 07/07/2018: Magnesium 6.0 07/08/2018: Hemoglobin 12.2; Platelets 176  Recent Lipid Panel    Component Value Date/Time   CHOL 130 09/05/2017 0000   TRIG 58 09/05/2017 0000   HDL 54 09/05/2017 0000   CHOLHDL 2.4 09/05/2017 0000   CHOLHDL 2.9 Ratio 05/16/2007 1804   VLDL 18 05/16/2007 1804   LDLCALC 64 09/05/2017 0000    Physical Exam:    VS:  BP 120/80 (BP Location: Left Arm, Patient Position: Sitting, Cuff Size: Normal)   Pulse 74   Temp 98.1 F (36.7 C)   Ht 5\' 6"  (1.676 m)   Wt 129 lb (58.5 kg)   SpO2 96%   BMI 20.82 kg/m     Wt Readings from Last 3 Encounters:  11/21/18 129 lb (58.5 kg)  09/14/18 133 lb (60.3 kg)  07/11/18 152 lb (68.9 kg)     GEN: Patient is in no acute distress HEENT: Normal NECK: No JVD; No carotid bruits LYMPHATICS: No lymphadenopathy CARDIAC: Hear sounds regular, 2/6 systolic murmur at the apex. RESPIRATORY:  Clear to auscultation without rales, wheezing or rhonchi  ABDOMEN: Soft, non-tender, non-distended MUSCULOSKELETAL:  No edema; No deformity  SKIN: Warm and dry NEUROLOGIC:  Alert and oriented x 3 PSYCHIATRIC:  Normal affect   Signed, 07/13/18, MD  11/21/2018 10:20 AM    [facility]

## 2018-12-19 ENCOUNTER — Other Ambulatory Visit: Payer: Self-pay

## 2018-12-25 ENCOUNTER — Other Ambulatory Visit: Payer: Self-pay | Admitting: Cardiology

## 2018-12-25 DIAGNOSIS — O10919 Unspecified pre-existing hypertension complicating pregnancy, unspecified trimester: Secondary | ICD-10-CM

## 2018-12-27 ENCOUNTER — Other Ambulatory Visit: Payer: Self-pay

## 2018-12-27 ENCOUNTER — Ambulatory Visit (INDEPENDENT_AMBULATORY_CARE_PROVIDER_SITE_OTHER): Payer: BC Managed Care – PPO | Admitting: Cardiology

## 2018-12-27 ENCOUNTER — Encounter: Payer: Self-pay | Admitting: Cardiology

## 2018-12-27 VITALS — BP 118/76 | HR 62 | Ht 66.0 in | Wt 128.0 lb

## 2018-12-27 DIAGNOSIS — I10 Essential (primary) hypertension: Secondary | ICD-10-CM | POA: Diagnosis not present

## 2018-12-27 DIAGNOSIS — Z1329 Encounter for screening for other suspected endocrine disorder: Secondary | ICD-10-CM

## 2018-12-27 MED ORDER — METOPROLOL SUCCINATE ER 100 MG PO TB24: 100.0000 mg | ORAL_TABLET | Freq: Every day | ORAL | 3 refills | Status: DC

## 2018-12-27 NOTE — Patient Instructions (Addendum)
Medication Instructions:  Your physician has recommended you make the following change in your medication:   STOP labetolol   START taking metoprolol XL 100 mg (1 tablet) once daily *If you need a refill on your cardiac medications before your next appointment, please call your pharmacy*  Lab Work: Your physician recommends that you have a BMP, CBC, TSH, hepatic drawn today  If you have labs (blood work) drawn today and your tests are completely normal, you will receive your results only by: Marland Kitchen MyChart Message (if you have MyChart) OR . A paper copy in the mail If you have any lab test that is abnormal or we need to change your treatment, we will call you to review the results.  Testing/Procedures: NONE  Follow-Up: At [facility], you and your health needs are our priority.  As part of our continuing mission to provide you with exceptional heart care, we have created designated Provider Care Teams.  These Care Teams include your primary Cardiologist (physician) and Advanced Practice Providers (APPs -  Physician Assistants and Nurse Practitioners) who all work together to provide you with the care you need, when you need it.  Your next appointment:   9 months  The format for your next appointment:   In Person  Provider:   Belva Crome, MD  Metoprolol extended-release tablets What is this medicine? METOPROLOL (me TOE proe lole) is a beta-blocker. Beta-blockers reduce the workload on the heart and help it to beat more regularly. This medicine is used to treat high blood pressure and to prevent chest pain. It is also used to after a heart attack and to prevent an additional heart attack from occurring. This medicine may be used for other purposes; ask your health care provider or pharmacist if you have questions. COMMON BRAND NAME(S): toprol, Toprol XL What should I tell my health care provider before I take this medicine? They need to know if you have any of these  conditions:  diabetes  heart or vessel disease like slow heart rate, worsening heart failure, heart block, sick sinus syndrome or Raynaud's disease  kidney disease  liver disease  lung or breathing disease, like asthma or emphysema  pheochromocytoma  thyroid disease  an unusual or allergic reaction to metoprolol, other beta-blockers, medicines, foods, dyes, or preservatives  pregnant or trying to get pregnant  breast-feeding How should I use this medicine? Take this medicine by mouth with a glass of water. Follow the directions on the prescription label. Do not crush or chew. Take this medicine with or immediately after meals. Take your doses at regular intervals. Do not take more medicine than directed. Do not stop taking this medicine suddenly. This could lead to serious heart-related effects. Talk to your pediatrician regarding the use of this medicine in children. While this drug may be prescribed for children as young as 6 years for selected conditions, precautions do apply. Overdosage: If you think you have taken too much of this medicine contact a poison control center or emergency room at once. NOTE: This medicine is only for you. Do not share this medicine with others. What if I miss a dose? If you miss a dose, take it as soon as you can. If it is almost time for your next dose, take only that dose. Do not take double or extra doses. What may interact with this medicine? This medicine may interact with the following medications:  certain medicines for blood pressure, heart disease, irregular heart beat  certain medicines for  depression, like monoamine oxidase (MAO) inhibitors, fluoxetine, or paroxetine  clonidine  dobutamine  epinephrine  isoproterenol  reserpine This list may not describe all possible interactions. Give your health care provider a list of all the medicines, herbs, non-prescription drugs, or dietary supplements you use. Also tell them if you  smoke, drink alcohol, or use illegal drugs. Some items may interact with your medicine. What should I watch for while using this medicine? Visit your doctor or health care professional for regular check ups. Contact your doctor right away if your symptoms worsen. Check your blood pressure and pulse rate regularly. Ask your health care professional what your blood pressure and pulse rate should be, and when you should contact them. You may get drowsy or dizzy. Do not drive, use machinery, or do anything that needs mental alertness until you know how this medicine affects you. Do not sit or stand up quickly, especially if you are an older patient. This reduces the risk of dizzy or fainting spells. Contact your doctor if these symptoms continue. Alcohol may interfere with the effect of this medicine. Avoid alcoholic drinks. This medicine may increase blood sugar. Ask your healthcare provider if changes in diet or medicines are needed if you have diabetes. What side effects may I notice from receiving this medicine? Side effects that you should report to your doctor or health care professional as soon as possible:  allergic reactions like skin rash, itching or hives  cold or numb hands or feet  depression  difficulty breathing  faint  fever with sore throat  irregular heartbeat, chest pain  rapid weight gain   signs and symptoms of high blood sugar such as being more thirsty or hungry or having to urinate more than normal. You may also feel very tired or have blurry vision.  swollen legs or ankles Side effects that usually do not require medical attention (report to your doctor or health care professional if they continue or are bothersome):  anxiety or nervousness  change in sex drive or performance  dry skin  headache  nightmares or trouble sleeping  short term memory loss  stomach upset or diarrhea This list may not describe all possible side effects. Call your doctor for  medical advice about side effects. You may report side effects to FDA at 1-800-FDA-1088. Where should I keep my medicine? Keep out of the reach of children. Store at room temperature between 15 and 30 degrees C (59 and 86 degrees F). Throw away any unused medicine after the expiration date. NOTE: This sheet is a summary. It may not cover all possible information. If you have questions about this medicine, talk to your doctor, pharmacist, or health care provider.  2020 Elsevier/Gold Standard (2017-12-05 11:09:41)

## 2018-12-27 NOTE — Progress Notes (Signed)
Cardiology Office Note:    Date:  12/27/2018   ID:  Paige Neal, DOB 08/20/1980, MRN [id number]  PCP:  Allayne Stack, DO  Cardiologist:  Garwin Brothers, MD   Referring MD: Allayne Stack, DO    ASSESSMENT:    1. Essential hypertension    PLAN:    In order of problems listed above:  1. Essential hypertension: Blood pressure stable.  She will continue her current medications.  Importance of compliance with diet and medication stressed.  Salt intake issues were discussed with the patient.  I mentioned to her that I could switch her to a once a day medication like metoprolol succinate and she wants to think about it.  She will have blood work today.  She will be seen in follow-up appointment in 9 months or earlier if she has any concerns.   Medication Adjustments/Labs and Tests Ordered: Current medicines are reviewed at length with the patient today.  Concerns regarding medicines are outlined above.  No orders of the defined types were placed in this encounter.  No orders of the defined types were placed in this encounter.    No chief complaint on file.    History of Present Illness:    Paige Neal is a 38 y.o. female.  I evaluated her for essential hypertension through her pregnancy.  She takes labetalol 400 mg 3 times a day and she mentions to me that this controls her blood pressure well.  No chest pain orthopnea or PND.  At the time of my evaluation, the patient is alert awake oriented and in no distress.  Past Medical History:  Diagnosis Date  . HTN (hypertension)   . Hyperemesis gravidarum   . Panic attack   . Tachycardia     Past Surgical History:  Procedure Laterality Date  . BREAST EXCISIONAL BIOPSY Bilateral   . BREAST FIBROADENOMA SURGERY    . gardisil x 3 - 08/30/2005      Current Medications: Current Meds  Medication Sig  . ALPRAZolam (XANAX) 0.25 MG tablet Take 0.5 tablet (0.125 mg)  as needed for anxiety every 8 hr  . ferrous  sulfate 325 (65 FE) MG tablet TAKE 1 TABLET BY MOUTH EVERY DAY WITH BREAKFAST  . labetalol (NORMODYNE) 200 MG tablet TAKE 2 TABLETS (400 MG TOTAL) BY MOUTH 3 (THREE) TIMES DAILY.  Marland Kitchen norethindrone (MICRONOR) 0.35 MG tablet Take 1 tablet (0.35 mg total) by mouth daily.  . prenatal vitamin w/FE, FA (PRENATAL 1 + 1) 27-1 MG TABS tablet Take 2 tablets by mouth daily at 12 noon.      Allergies:   Patient has no known allergies.   Social History   Socioeconomic History  . Marital status: Married    Spouse name: Not on file  . Number of children: Not on file  . Years of education: Not on file  . Highest education level: Not on file  Occupational History  . Not on file  Social Needs  . Financial resource strain: Not on file  . Food insecurity    Worry: Never true    Inability: Never true  . Transportation needs    Medical: No    Non-medical: No  Tobacco Use  . Smoking status: Never Smoker  . Smokeless tobacco: Never Used  Substance and Sexual Activity  . Alcohol use: No  . Drug use: No  . Sexual activity: Yes    Birth control/protection: None  Lifestyle  . Physical activity    Days  per week: Not on file    Minutes per session: Not on file  . Stress: Not on file  Relationships  . Social Herbalist on phone: Not on file    Gets together: Not on file    Attends religious service: Not on file    Active member of club or organization: Not on file    Attends meetings of clubs or organizations: Not on file    Relationship status: Not on file  Other Topics Concern  . Not on file  Social History Narrative   Pt lives in [place] and works at [employer],  single     Family History: The patient's family history includes Coronary artery disease in her mother; Hypertension in her father, mother, and sister.  ROS:   Please see the history of present illness.    All other systems reviewed and are negative.  EKGs/Labs/Other Studies Reviewed:    The following studies were  reviewed today: Results of prior evaluations were discussed with him.   Recent Labs: 07/06/2018: ALT 23; BUN 10; Creatinine, Ser 0.78; Potassium 4.2; Sodium 137 07/07/2018: Magnesium 6.0 07/08/2018: Hemoglobin 12.2; Platelets 176  Recent Lipid Panel    Component Value Date/Time   CHOL 130 09/05/2017 0000   TRIG 58 09/05/2017 0000   HDL 54 09/05/2017 0000   CHOLHDL 2.4 09/05/2017 0000   CHOLHDL 2.9 Ratio 05/16/2007 1804   VLDL 18 05/16/2007 1804   LDLCALC 64 09/05/2017 0000    Physical Exam:    VS:  BP 118/76 (BP Location: Left Arm, Patient Position: Sitting, Cuff Size: Normal)   Pulse 62   Ht 5\' 6"  (1.676 m)   Wt 128 lb (58.1 kg)   SpO2 98%   BMI 20.66 kg/m     Wt Readings from Last 3 Encounters:  12/27/18 128 lb (58.1 kg)  11/21/18 129 lb (58.5 kg)  09/14/18 133 lb (60.3 kg)     GEN: Patient is in no acute distress HEENT: Normal NECK: No JVD; No carotid bruits LYMPHATICS: No lymphadenopathy CARDIAC: Hear sounds regular, 2/6 systolic murmur at the apex. RESPIRATORY:  Clear to auscultation without rales, wheezing or rhonchi  ABDOMEN: Soft, non-tender, non-distended MUSCULOSKELETAL:  No edema; No deformity  SKIN: Warm and dry NEUROLOGIC:  Alert and oriented x 3 PSYCHIATRIC:  Normal affect   Signed, Jenean Lindau, MD  12/27/2018 10:13 AM    [place]

## 2018-12-27 NOTE — Addendum Note (Signed)
Addended by: Beckey Rutter on: 12/27/2018 10:29 AM   Modules accepted: Orders

## 2018-12-27 NOTE — Addendum Note (Signed)
Addended by: Beckey Rutter on: 12/27/2018 10:37 AM   Modules accepted: Orders

## 2018-12-28 LAB — CBC
Hematocrit: 37.2 % (ref 34.0–46.6)
Hemoglobin: 12.4 g/dL (ref 11.1–15.9)
MCH: 32.3 pg (ref 26.6–33.0)
MCHC: 33.3 g/dL (ref 31.5–35.7)
MCV: 97 fL (ref 79–97)
Platelets: 271 10*3/uL (ref 150–450)
RBC: 3.84 x10E6/uL (ref 3.77–5.28)
RDW: 13.3 % (ref 11.7–15.4)
WBC: 2.7 10*3/uL — ABNORMAL LOW (ref 3.4–10.8)

## 2018-12-28 LAB — HEPATIC FUNCTION PANEL
ALT: 11 IU/L (ref 0–32)
AST: 15 IU/L (ref 0–40)
Albumin: 4.5 g/dL (ref 3.8–4.8)
Alkaline Phosphatase: 80 IU/L (ref 39–117)
Bilirubin Total: 0.4 mg/dL (ref 0.0–1.2)
Bilirubin, Direct: 0.13 mg/dL (ref 0.00–0.40)
Total Protein: 6.7 g/dL (ref 6.0–8.5)

## 2018-12-28 LAB — TSH: TSH: 1.47 u[IU]/mL (ref 0.450–4.500)

## 2018-12-28 LAB — BASIC METABOLIC PANEL
BUN/Creatinine Ratio: 18 (ref 9–23)
BUN: 15 mg/dL (ref 6–20)
CO2: 26 mmol/L (ref 20–29)
Calcium: 9.2 mg/dL (ref 8.7–10.2)
Chloride: 103 mmol/L (ref 96–106)
Creatinine, Ser: 0.83 mg/dL (ref 0.57–1.00)
GFR calc Af Amer: 104 mL/min/{1.73_m2} (ref >59)
GFR calc non Af Amer: 90 mL/min/{1.73_m2} (ref >59)
Glucose: 80 mg/dL (ref 65–99)
Potassium: 3.9 mmol/L (ref 3.5–5.2)
Sodium: 140 mmol/L (ref 134–144)

## 2019-01-02 ENCOUNTER — Telehealth: Payer: Self-pay

## 2019-01-02 ENCOUNTER — Encounter: Payer: Self-pay | Admitting: Family Medicine

## 2019-01-02 NOTE — Telephone Encounter (Signed)
-----   Message from Jenean Lindau, MD sent at 12/28/2018  8:16 AM EDT ----- Only issue I see here is that her white count is low and she must call her primary care about this.  The results of the study is unremarkable. Please inform patient. I will discuss in detail at next appointment. Cc  primary care/referring physician Jenean Lindau, MD 12/28/2018 8:16 AM

## 2019-01-02 NOTE — Telephone Encounter (Signed)
Results relayed to patient she will touch base with Dr. Stephenie Acres office to f/u on WBC. Copy sent to PCP.

## 2019-01-03 ENCOUNTER — Other Ambulatory Visit: Payer: Self-pay | Admitting: Family Medicine

## 2019-01-03 DIAGNOSIS — D72819 Decreased white blood cell count, unspecified: Secondary | ICD-10-CM

## 2019-01-11 ENCOUNTER — Telehealth: Payer: Self-pay

## 2019-01-11 MED ORDER — AMLODIPINE BESYLATE 5 MG PO TABS: 5.0000 mg | ORAL_TABLET | Freq: Every day | ORAL | 3 refills | Status: DC

## 2019-01-11 NOTE — Addendum Note (Signed)
Addended by: Beckey Rutter on: 01/11/2019 10:15 AM   Modules accepted: Orders

## 2019-01-11 NOTE — Telephone Encounter (Signed)
Per Dr. Docia Furl request script for 5mg  norvasc sent to pharmacy. Patient is currently breastfeeding but no new plans of pregnancy. Informed of risk and informed to speak to pharmacist if further questions.RN discussed lowering sodium intake and increasing hydration. Patient will continue to monitor BP twice daily and call with update on Monday.

## 2019-01-11 NOTE — Telephone Encounter (Signed)
Left message for patient to call back concerning BP issues.

## 2019-01-11 NOTE — Addendum Note (Signed)
Addended by: Beckey Rutter on: 01/11/2019 10:28 AM   Modules accepted: Orders

## 2019-01-18 ENCOUNTER — Telehealth: Payer: Self-pay | Admitting: Cardiology

## 2019-01-18 NOTE — Telephone Encounter (Signed)
Bps ranging systolics 622-633'H over diastolics 54-562'B since starting amlodipine. BP today 144/99 HR 91. Patient states she has been feeling fine but not very consistent when taking her BP. RN expressed need for patient to take BP in morning 30 min to hour after medication and once at night, daily around the same time. Patient expressed understanding and will call next week with updates.Note routed to Dr. Docia Furl.

## 2019-01-18 NOTE — Telephone Encounter (Signed)
Please call.

## 2019-02-01 ENCOUNTER — Encounter: Payer: Self-pay | Admitting: Family Medicine

## 2019-02-01 ENCOUNTER — Ambulatory Visit: Payer: BC Managed Care – PPO | Admitting: Family Medicine

## 2019-02-01 ENCOUNTER — Other Ambulatory Visit: Payer: Self-pay

## 2019-02-01 VITALS — BP 125/80 | HR 95 | Wt 129.2 lb

## 2019-02-01 DIAGNOSIS — D72819 Decreased white blood cell count, unspecified: Secondary | ICD-10-CM

## 2019-02-01 DIAGNOSIS — D72810 Lymphocytopenia: Secondary | ICD-10-CM | POA: Diagnosis not present

## 2019-02-01 DIAGNOSIS — D708 Other neutropenia: Secondary | ICD-10-CM

## 2019-02-01 DIAGNOSIS — I10 Essential (primary) hypertension: Secondary | ICD-10-CM

## 2019-02-01 HISTORY — DX: Decreased white blood cell count, unspecified: D72.819

## 2019-02-01 NOTE — Assessment & Plan Note (Signed)
Incidental isolated finding of WBC 2.7 on recent CBC in 11/2018 without previous known leukopenia.  Otherwise, hemoglobin and platelets within normal limits.  Reassuringly feels well without any concerning symptomatology suggestive of malignancy.  Not on any medications that would cause a leukopenia or evidence of nutritional deficiency such as B12/folate especially with normal hemoglobin profile. May have been transient in the setting of unknown viral illness vs idiopathic, will recheck today with differential and assess if further work-up is needed.

## 2019-02-01 NOTE — Patient Instructions (Signed)
It was wonderful meeting you today.  Keep up the great work!  We will recheck your white count today and pending those results we will consider further lab work if needed and have you follow-up as appropriate.

## 2019-02-01 NOTE — Progress Notes (Signed)
   Subjective:    Patient ID: Paige Neal, female    DOB: [birth year], 38 y.o.   MRN: [id number]   CC: Follow-up decreased WBC  HPI: Paige Neal is a 38 year old female with essential hypertension presenting discuss the following:  Leukopenia: Had labs drawn by her cardiologist in October showing an incidental WBC of 2.7, otherwise CBC within normal limits.  Has not had decreased WBC in the past.  Doing well, has no concerns.  Denies any fatigue, unexpected weight loss, abdominal pains, night sweats, or any notable mass/enlarged lymph nodes.  No recent infections.  Hypertension: Currently following with Dr. Geraldo Pitter for this and just recently started on Norvasc 5 mg.  States she was prescribed metoprolol as well however she has not been taking this.  Her blood pressure seem to be under better control since she started the Norvasc, 125/80 in our office today.  Asymptomatic, denies any lightheadedness/dizziness, headaches, chest pains, SOB.  No cardiac risk factors.  Had elevated blood pressures during her pregnancy as well, was managed with labetalol.  She is fortunately up-to-date on all of her health screening maintenance.  Had her flu shot already this season.  She has a 38-month-old son at home, has been trying hard to take care of him and is handling this well.  She is breast-feeding with no issues.  Review of Systems Per HPI  Objective:  BP 125/80   Pulse 95   Wt 129 lb 3.2 oz (58.6 kg)   SpO2 99%   BMI 20.85 kg/m  Vitals and nursing note reviewed  General: NAD, pleasant HEENT: no anterior or posterior cervical lymphadenopathy. Cardiac: RRR, normal heart sounds Respiratory: CTAB, normal effort Abdomen: soft, nontender, nondistended, no hepatosplenomegaly palpated Skin: warm and dry, no rashes noted Neuro: alert and oriented, no focal deficits Psych: normal affect  Assessment & Plan:   Leukopenia Incidental isolated finding of WBC 2.7 on recent CBC in 11/2018 without  previous known leukopenia.  Otherwise, hemoglobin and platelets within normal limits.  Reassuringly feels well without any concerning symptomatology suggestive of malignancy.  Not on any medications that would cause a leukopenia or evidence of nutritional deficiency such as B12/folate especially with normal hemoglobin profile. May have been transient in the setting of unknown viral illness vs idiopathic, will recheck today with differential and assess if further work-up is needed.   Essential hypertension Well-controlled through BP in the office today, and seems to be under better control after recently starting Norvasc.  Following with cardiology.  Goal for this otherwise healthy 38 year old female should be <130/80. Recommended continuing current regimen and checking her blood pressure at home with her cuff.  Encouraged well-balanced diet and staying physically active as she can.   Follow-up pending repeat lab work as discussed above.  [facility] Resident PGY-2

## 2019-02-01 NOTE — Assessment & Plan Note (Signed)
Well-controlled through BP in the office today, and seems to be under better control after recently starting Norvasc.  Following with cardiology.  Goal for this otherwise healthy 38 year old female should be <130/80. Recommended continuing current regimen and checking her blood pressure at home with her cuff.  Encouraged well-balanced diet and staying physically active as she can.

## 2019-02-02 LAB — CBC WITH DIFFERENTIAL/PLATELET
Basophils Absolute: 0 10*3/uL (ref 0.0–0.2)
Basos: 1 %
EOS (ABSOLUTE): 0.2 10*3/uL (ref 0.0–0.4)
Eos: 4 %
Hematocrit: 39.1 % (ref 34.0–46.6)
Hemoglobin: 13.1 g/dL (ref 11.1–15.9)
Immature Grans (Abs): 0 10*3/uL (ref 0.0–0.1)
Immature Granulocytes: 0 %
Lymphocytes Absolute: 1.6 10*3/uL (ref 0.7–3.1)
Lymphs: 40 %
MCH: 32.8 pg (ref 26.6–33.0)
MCHC: 33.5 g/dL (ref 31.5–35.7)
MCV: 98 fL — ABNORMAL HIGH (ref 79–97)
Monocytes Absolute: 0.6 10*3/uL (ref 0.1–0.9)
Monocytes: 15 %
Neutrophils Absolute: 1.6 10*3/uL (ref 1.4–7.0)
Neutrophils: 40 %
Platelets: 279 10*3/uL (ref 150–450)
RBC: 4 x10E6/uL (ref 3.77–5.28)
RDW: 12.9 % (ref 11.7–15.4)
WBC: 4 10*3/uL (ref 3.4–10.8)

## 2019-02-12 ENCOUNTER — Other Ambulatory Visit: Payer: Self-pay | Admitting: Obstetrics

## 2019-02-12 NOTE — Telephone Encounter (Signed)
Refill request from pharmacy for Iron supp 325mg     Please send if approved

## 2019-05-01 ENCOUNTER — Other Ambulatory Visit: Payer: Self-pay | Admitting: Cardiology

## 2019-05-01 DIAGNOSIS — O10919 Unspecified pre-existing hypertension complicating pregnancy, unspecified trimester: Secondary | ICD-10-CM

## 2019-05-04 ENCOUNTER — Other Ambulatory Visit: Payer: Self-pay | Admitting: Cardiology

## 2019-05-24 ENCOUNTER — Telehealth: Payer: Self-pay

## 2019-05-24 ENCOUNTER — Other Ambulatory Visit: Payer: Self-pay

## 2019-05-24 ENCOUNTER — Ambulatory Visit: Payer: Self-pay | Admitting: Family Medicine

## 2019-05-24 ENCOUNTER — Encounter: Payer: Self-pay | Admitting: Family Medicine

## 2019-05-24 ENCOUNTER — Ambulatory Visit (INDEPENDENT_AMBULATORY_CARE_PROVIDER_SITE_OTHER): Payer: BC Managed Care – PPO | Admitting: Family Medicine

## 2019-05-24 VITALS — BP 124/78 | HR 82 | Ht 66.0 in | Wt 126.0 lb

## 2019-05-24 DIAGNOSIS — N643 Galactorrhea not associated with childbirth: Secondary | ICD-10-CM

## 2019-05-24 DIAGNOSIS — N63 Unspecified lump in unspecified breast: Secondary | ICD-10-CM | POA: Diagnosis not present

## 2019-05-24 NOTE — Progress Notes (Signed)
    SUBJECTIVE:   CHIEF COMPLAINT / HPI: Sore breast  Paige Neal is a 39 year old female presenting for the following:  Breast soreness: Finished breast-feeding approximately 4 months ago, since this time has intermittently (about 3 times) felt a sensation of breast fullness and has been able to express a little bit of breast milk after this. From her left breast only.   She also reports region on her left upper outer breast that started becoming sore since yesterday, this is been sore in the past and has had ultrasound evaluation in 08/2018. U/S showed a benign appearing lymph node.  Previously when she would breast-feed the soreness in that area would improve.   PERTINENT  PMH / PSH: Hypertension, leukopenia  OBJECTIVE:   BP 124/78   Pulse 82   Ht 5\' 6"  (1.676 m)   Wt 126 lb (57.2 kg)   LMP 05/06/2019 (Exact Date)   SpO2 99%   BMI 20.34 kg/m   General: Alert, NAD HEENT: NCAT, MMM Lungs: no increased WOB  Abdomen: soft, non-tender Ext: Warm, dry Breasts:   Right breast normal without mass, skin or nipple changes or axillary nodes   Left breast normal without mass, skin or nipple changes, however note a soft, fluctuant approximate 2X 2 cm presumed lymph node within the axillary/left upper outer breast region.  Tender to palpation.  ASSESSMENT/PLAN:   Galactorrhea Predominantly left-sided with rare occurrence (~3 times in the past 4 months).  Suspect is likely physiologic after discontinuing breast-feeding 4 months ago.  Provided reassurance and discussed wearing appropriate bra support/warm compresses.  However, if continues to occur in the next 1-2 months could consider obtaining a repeat breast ultrasound and prolactin level.  Breast mass Ultrasound in 08/2018 showing a benign lymph node within the axilla.  Through chart review and patient report, region is approximately the same size and feels benign.  However, given concerns as above could consider reultrasound in this region  if persistent difficulty with galactorrhea, persistent soreness, hardens, or increases in size.    Follow-up in approximately 1 month for above or sooner if needed.  09/2018, DO [place] [facility]

## 2019-05-24 NOTE — Telephone Encounter (Signed)
Patient calls nurse line stating she not breast fed in ~65months. Patient one breast still feels like it is trying to produce milk and has become tender. Patient stated she was able to express some milk or discharge last night, however with no relief. Patient scheduled with PCP for this afternoon.

## 2019-05-24 NOTE — Patient Instructions (Signed)
Wonderful to see you! To help with the soreness and let down--- try to wear a good supportive bra and can do warm compresses. Additionally if the region underneath your arm pit is continually sore, grows in size, or becomes more firm please let us know.   I would like you to follow up in 1 month to see if you are continuing to struggle with some let down and soreness, if so we can consider some labs at that point or ultrasound again.

## 2019-05-27 ENCOUNTER — Encounter: Payer: Self-pay | Admitting: Family Medicine

## 2019-05-27 DIAGNOSIS — N643 Galactorrhea not associated with childbirth: Secondary | ICD-10-CM | POA: Insufficient documentation

## 2019-05-27 HISTORY — DX: Galactorrhea not associated with childbirth: N64.3

## 2019-05-27 NOTE — Assessment & Plan Note (Addendum)
Predominantly left-sided with rare occurrence (~3 times in the past 4 months).  Suspect is likely physiologic after discontinuing breast-feeding 4 months ago.  Provided reassurance and discussed wearing appropriate bra support/warm compresses.  However, if continues to occur in the next 1-2 months could consider obtaining a repeat breast ultrasound and prolactin level.

## 2019-05-27 NOTE — Assessment & Plan Note (Signed)
Ultrasound in 08/2018 showing a benign lymph node within the axilla.  Through chart review and patient report, region is approximately the same size and feels benign.  However, given concerns as above could consider reultrasound in this region if persistent difficulty with galactorrhea, persistent soreness, hardens, or increases in size.

## 2019-06-15 ENCOUNTER — Other Ambulatory Visit: Payer: Self-pay | Admitting: Obstetrics & Gynecology

## 2019-08-02 ENCOUNTER — Other Ambulatory Visit: Payer: Self-pay | Admitting: Obstetrics

## 2019-10-03 ENCOUNTER — Ambulatory Visit (INDEPENDENT_AMBULATORY_CARE_PROVIDER_SITE_OTHER): Payer: BC Managed Care – PPO | Admitting: Family Medicine

## 2019-10-03 ENCOUNTER — Encounter: Payer: Self-pay | Admitting: Family Medicine

## 2019-10-03 ENCOUNTER — Other Ambulatory Visit: Payer: Self-pay

## 2019-10-03 VITALS — BP 106/70 | HR 82 | Wt 124.0 lb

## 2019-10-03 DIAGNOSIS — N939 Abnormal uterine and vaginal bleeding, unspecified: Secondary | ICD-10-CM

## 2019-10-03 DIAGNOSIS — N926 Irregular menstruation, unspecified: Secondary | ICD-10-CM | POA: Diagnosis not present

## 2019-10-03 HISTORY — DX: Abnormal uterine and vaginal bleeding, unspecified: N93.9

## 2019-10-03 LAB — POCT URINE PREGNANCY: Preg Test, Ur: NEGATIVE

## 2019-10-03 NOTE — Assessment & Plan Note (Signed)
Likely dysfunctional intermenstrual bleeding in the setting of intermittent late doses of Micronor.  Reassuringly Upreg negative in the office today.  Recent TSH in 11/2018 wnl.  Recommended and encouraged taking Micronor at exact same time every single day for effectiveness.  Can use ibuprofen TID to reduce bleeding at onset if needed.  Follow-up in 2-3 months if persistent despite changes above, could consider transvaginal U/S +/- prolactin at that time.

## 2019-10-03 NOTE — Patient Instructions (Addendum)
It was wonderful to see you today.  Please continue to track your menstrual cycles.  It is of the extreme importance that you take your Micronor pill at the exact same time (within an hour) every single day for it to be effective.  Having late doses that this medication can easily impact your cycle.   You can also try ibuprofen 400 mg - 800 mg 3 times a day for a few days at the onset of intermenstrual bleeding to help decrease the amount.  Please follow-up if despite good use of your Micronor if you are still having frequent bleeding in the next 2-3 months.

## 2019-10-03 NOTE — Progress Notes (Signed)
    SUBJECTIVE:   CHIEF COMPLAINT / HPI: Frequent vaginal bleeding  Paige Neal is a 39 year old female presenting for evaluation of abnormal uterine bleeding.  She reports since June that she has had a few episodes of intermenstrual vaginal bleeding.  Very light, last about 3-4 days.  Only requires panty liner.  Most recent episode lasted from this Sunday until Wednesday of this week.  Currently taking Micronor for contraception, often accidentally takes late.  Does have an alarm on her phone to take it.  Sexually active with her husband, does not use barrier protection.  Has not had this issue before and wanted to make sure everything was okay.  Denies any associated changes in vaginal discharge, vaginal itching/irritation, dysuria, abdominal pain/cramping.  She is not interested in any injectable or insertable contraception.  Cannot do OCPs due to chronic hypertension.  PERTINENT  PMH / PSH: Hypertension, suspected benign breast mass  OBJECTIVE:   BP 106/70   Pulse 82   Wt 124 lb (56.2 kg)   LMP 09/29/2019   SpO2 98%   BMI 20.01 kg/m   General: Alert, NAD HEENT: NCAT Cardiac: RRR no m/g/r Lungs: Clear bilaterally, no increased WOB  Abdomen: soft, non-tender, non-distended, no enlarged uterus palpated Msk: Moves all extremities spontaneously  Ext: Warm, dry, 2+ distal pulses  Urine pregnancy test negative  ASSESSMENT/PLAN:   Abnormal uterine bleeding (AUB) Likely dysfunctional intermenstrual bleeding in the setting of intermittent late doses of Micronor.  Reassuringly Upreg negative in the office today.  Recent TSH in 11/2018 wnl.  Recommended and encouraged taking Micronor at exact same time every single day for effectiveness.  Can use ibuprofen TID to reduce bleeding at onset if needed.  Follow-up in 2-3 months if persistent despite changes above, could consider transvaginal U/S +/- prolactin at that time.     Follow-up as above or sooner if needed.  Allayne Stack,  DO [place] [facility]

## 2019-10-25 ENCOUNTER — Other Ambulatory Visit: Payer: Self-pay | Admitting: Cardiology

## 2019-11-05 ENCOUNTER — Ambulatory Visit: Payer: BC Managed Care – PPO | Admitting: Family Medicine

## 2019-11-05 ENCOUNTER — Encounter: Payer: Self-pay | Admitting: Family Medicine

## 2019-11-05 ENCOUNTER — Other Ambulatory Visit: Payer: Self-pay

## 2019-11-05 VITALS — BP 138/90 | HR 75 | Wt 122.0 lb

## 2019-11-05 DIAGNOSIS — L02612 Cutaneous abscess of left foot: Secondary | ICD-10-CM

## 2019-11-05 HISTORY — DX: Cutaneous abscess of left foot: L02.612

## 2019-11-05 MED ORDER — CEPHALEXIN 500 MG PO CAPS: 500.0000 mg | ORAL_CAPSULE | Freq: Four times a day (QID) | ORAL | 0 refills | Status: DC

## 2019-11-05 NOTE — Patient Instructions (Signed)
It was so great meeting you today!  I have prescribed an antibiotic called Keflex, please take this for 5 days, 4 times a day.   Please follow up in 1 week if no improvement or you experience worsening symptoms. If the area heals, then you may cancel this appointment. If not, we will consider draining the affected area at the next appointment.  If you notice any worsening symptoms including fever, swelling, redness and increase in size then please go to the emergency department.   Thank you for allowing me to be a part of your medica care!

## 2019-11-05 NOTE — Assessment & Plan Note (Signed)
-  Less likely a callus from the soft characteristic, concern for abscess. Considered infectious cause however patient no showing any systemic infectious causes. Vitals good today in the office. -Keflex 500 mg 4x/day prescribed to allow for broad spectrum coverage -Follow up in 1 week, if no improvement or worsening symptoms, will consider incision and drainage. Patient instructed to cancel appointment if antibiotics resolves her potential abscess. -Instructed on strict precautions including by limited to development of worsening symptoms including fever, erythema, edema, etc. If this occurs, patient instructed to go to the ED.  -Patient understands the plan and agrees.

## 2019-11-05 NOTE — Progress Notes (Signed)
    SUBJECTIVE:   CHIEF COMPLAINT / HPI:   Left foot pain Patient presents with left foot pain that started 4 days ago when she noticed swelling of the toes and part of the sole of the left foot. Denies noticing any swelling in any other areas. Later she noticed what felt like a bump form. Initially, elevating her foot helped but now she endorses pain in that area that worsens when she walks on the left foot. Also endorses itching. Denies any associated trauma or injury to the area. Denies fever and chills.  Denies wearing no shoes or recent insect bites. She shares with me that she has to fold another sock and place it between her foot and shoe to ease the pain to allow her to walk. All other ROS negative. Patient does not have a history of diabetes.    OBJECTIVE:   BP 138/90   Pulse 75   Wt 122 lb (55.3 kg)   LMP 10/25/2019 (Exact Date)   SpO2 98%   Breastfeeding No   BMI 19.69 kg/m   General: Patient well-appearing, in no acute distress. HEENT: normocephalic, no lymphadenopathy, supple neck CV: RRR, no murmurs or gallops appreciated Resp: lungs clear to auscultation bilaterally, no rales or rhonchi noted Abdomen: soft, nontender, no evidence of organomegaly, presence of active bowel sounds Derm: skin warm and dry, oval-shaped skin color induration that is soft and tender on palpation, no erythema noted, no pus or bloody discharge noted from the affected area Ext: radial and distal pulses equal and strong bilaterally, no LE edema noted  Neuro: normal gait, ambulates without assistance      ASSESSMENT/PLAN:   Foot abscess, left -Less likely a callus from the soft characteristic, concern for abscess. Considered infectious cause however patient no showing any systemic infectious causes. Vitals good today in the office. -Keflex 500 mg 4x/day prescribed to allow for broad spectrum coverage -Follow up in 1 week, if no improvement or worsening symptoms, will consider incision and  drainage. Patient instructed to cancel appointment if antibiotics resolves her potential abscess. -Instructed on strict precautions including by limited to development of worsening symptoms including fever, erythema, edema, etc. If this occurs, patient instructed to go to the ED.  -Patient understands the plan and agrees.  PHQ-9 reviewed, patient total score 0.     Reece Leader, DO [place] [facility]

## 2019-11-12 ENCOUNTER — Encounter (HOSPITAL_COMMUNITY): Payer: Self-pay | Admitting: Emergency Medicine

## 2019-11-12 ENCOUNTER — Other Ambulatory Visit: Payer: Self-pay

## 2019-11-12 ENCOUNTER — Ambulatory Visit (HOSPITAL_COMMUNITY)
Admission: EM | Admit: 2019-11-12 | Discharge: 2019-11-12 | Disposition: A | Discharge status: Home / Self Care | Payer: BC Managed Care – PPO | Attending: Emergency Medicine | Admitting: Emergency Medicine

## 2019-11-12 DIAGNOSIS — S90822A Blister (nonthermal), left foot, initial encounter: Secondary | ICD-10-CM | POA: Diagnosis not present

## 2019-11-12 MED ORDER — IBUPROFEN 800 MG PO TABS: 800.0000 mg | ORAL_TABLET | Freq: Three times a day (TID) | ORAL | 0 refills | Status: DC

## 2019-11-12 NOTE — Discharge Instructions (Signed)
Use anti-inflammatories for pain/swelling. You may take up to 800 mg Ibuprofen every 8 hours with food. You may supplement Ibuprofen with Tylenol [phone] mg every 8 hours.  Warm soaks Keep clean and dry Follow up for any concerns

## 2019-11-12 NOTE — ED Provider Notes (Signed)
[facility]    CSN: [id number] Arrival date & time: 11/12/19  0805      History   Chief Complaint Chief Complaint  Patient presents with   Foot Pain    HPI Paige Neal is a 39 y.o. female history of hypertension presenting today for evaluation of foot abscess.  Patient reports over the past 1 to 2 weeks she has developed an area of swelling and pain to her foot.  Pain is mainly with weightbearing.  Denies pain at rest.  She has been avoiding pressure to this area.  Has been on a course of Keflex for approximately 5 days without much improvement.  Denies fevers.  Denies known injury or trauma prior to area of swelling developing.  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 Authorizing Provider  amLODipine (NORVASC) 5 MG tablet TAKE 1 TABLET BY MOUTH EVERY DAY 10/25/19  Yes Revankar, Aundra Dubin, MD  ALPRAZolam (XANAX) 0.25 MG tablet Take 0.5 tablet (0.125 mg)  as needed for anxiety every 8 hr Patient not taking: Reported on 11/05/2019 11/21/18   Revankar, Aundra Dubin, MD  cephALEXin (KEFLEX) 500 MG capsule Take 1 capsule (500 mg total) by mouth 4 (four) times daily. 11/05/19    Ganta, Anupa, DO  ferrous sulfate 325 (65 FE) MG tablet TAKE 1 TABLET BY MOUTH EVERY DAY WITH BREAKFAST 08/03/19   Brock Bad, MD  ibuprofen (ADVIL) 800 MG tablet Take 1 tablet (800 mg total) by mouth 3 (three) times daily. 11/12/19   Ahmarion Saraceno C, PA-C  metoprolol succinate (TOPROL-XL) 100 MG 24 hr tablet Take 1 tablet (100 mg total) by mouth daily. Take with or immediately following a meal. 12/27/18 03/27/19  Revankar, Aundra Dubin, MD  norethindrone (MICRONOR) 0.35 MG tablet TAKE 1 TABLET BY MOUTH EVERY DAY Patient not taking: Reported on 11/05/2019 06/15/19   Anyanwu, Jethro Bastos, MD  prenatal vitamin w/FE, FA (PRENATAL 1 + 1) 27-1 MG TABS tablet Take 2 tablets by mouth daily at 12 noon.     [provider]    Family History Family History  Problem Relation Age of Onset   Coronary artery disease Mother    Hypertension Mother    Hypertension Father    Hypertension Sister     Social History Social History   Tobacco Use   Smoking status: Never Smoker  Smokeless tobacco: Never Used  Vaping Use   Vaping Use: Never used  Substance Use Topics   Alcohol use: No   Drug use: No     Allergies   Patient has no known allergies.   Review of Systems Review of Systems  Constitutional: Negative for fatigue and fever.  Eyes: Negative for visual disturbance.  Respiratory: Negative for shortness of breath.   Cardiovascular: Negative for chest pain.  Gastrointestinal: Negative for abdominal pain, nausea and vomiting.  Musculoskeletal: Negative for arthralgias and joint swelling.  Skin: Positive for color change and rash. Negative for wound.  Neurological: Negative for dizziness, weakness, light-headedness and headaches.     Physical Exam Triage Vital Signs ED Triage Vitals  Enc Vitals Group     BP      Pulse      Resp      Temp      Temp src      SpO2      Weight      Height      Head Circumference      Peak Flow      Pain Score      Pain Loc      Pain  Edu?      Excl. in GC?    No data found.  Updated Vital Signs BP 135/81 (BP Location: Right Arm)    Pulse 82    Temp 98.5 F (36.9 C) (Oral)    Resp 17    LMP 10/25/2019 (Exact Date)    SpO2 98%   Visual Acuity Right Eye Distance:   Left Eye Distance:   Bilateral Distance:    Right Eye Near:   Left Eye Near:    Bilateral Near:     Physical Exam Vitals and nursing note reviewed.  Constitutional:      Appearance: She is well-developed.     Comments: No acute distress  HENT:     Head: Normocephalic and atraumatic.     Nose: Nose normal.  Eyes:     Conjunctiva/sclera: Conjunctivae normal.  Cardiovascular:     Rate and Rhythm: Normal rate.  Pulmonary:     Effort: Pulmonary effort is normal. No respiratory distress.  Abdominal:     General: There is no distension.  Musculoskeletal:        General: Normal range of motion.     Cervical back: Neck supple.  Skin:    General: Skin is warm and dry.     Comments: Ball of left foot with 2 cm area of swelling and fluctuance, minimal surrounding induration  Neurological:     Mental Status: She is alert and oriented to person, place, and time.      UC Treatments / Results  Labs (all labs ordered are listed, but only abnormal results are displayed) Labs Reviewed - No data to display  EKG   Radiology No results found.  Procedures Incision and Drainage  Date/Time: 11/12/2019 8:40 AM Performed by: Janetta Vandoren, [place] C, PA-C Authorized by: Alanna Storti, Junius Creamer, PA-C   Consent:    Consent obtained:  Verbal   Consent given by:  Patient   Alternatives discussed:  No treatment Location:    Type:  Bulla   Size:  2   Location:  Lower extremity   Lower extremity location:  Foot   Foot location:  L foot Pre-procedure details:    Skin preparation:  Betadine Anesthesia (see MAR for exact dosages):    Anesthesia method:  None Procedure type:  Complexity:  Simple Procedure details:    Incision types:  Single straight    Incision depth:  Dermal   Scalpel blade:  11   Drainage:  Serous   Drainage amount:  Moderate   Wound treatment:  Wound left open   Packing materials:  None Post-procedure details:    Patient tolerance of procedure:  Tolerated well, no immediate complications   (including critical care time)  Medications Ordered in UC Medications - No data to display  Initial Impression / Assessment and Plan / UC Course  I have reviewed the triage vital signs and the nursing notes.  Pertinent labs & imaging results that were available during my care of the patient were reviewed by me and considered in my medical decision making (see chart for details).     I&D performed, no pustular drainage, suspect likely more blister/bulla. May complete course of Keflex previously prescribed. Anti-inflammatories, warm soaks. Monitor for gradual resolution.  Discussed strict return precautions. Patient verbalized understanding and is agreeable with plan.  Final Clinical Impressions(s) / UC Diagnoses   Final diagnoses:  Blister of left foot without infection, initial encounter     Discharge Instructions     Use anti-inflammatories for pain/swelling. You may take up to 800 mg Ibuprofen every 8 hours with food. You may supplement Ibuprofen with Tylenol [phone] mg every 8 hours.  Warm soaks Keep clean and dry Follow up for any concerns    ED Prescriptions    Medication Sig Dispense Auth. Provider   ibuprofen (ADVIL) 800 MG tablet Take 1 tablet (800 mg total) by mouth 3 (three) times daily. 21 tablet Miasia Crabtree, [place] C, PA-C     PDMP not reviewed this encounter.   Lew Dawes, [place] 11/12/19 [phone]

## 2019-11-12 NOTE — ED Triage Notes (Signed)
Pt presents with left foot pain. States there is an abscess on foot. Currently on antibiotics for abscess.

## 2019-11-13 ENCOUNTER — Telehealth: Payer: Self-pay

## 2019-11-13 NOTE — Telephone Encounter (Signed)
Patient calls nurse line regarding follow up for foot blister. Patient reports going to UC yesterday for incision and drainage. States clear fluid was drained from blister. Still reports pain and swelling to area.   Patient also expresses concerns for diabetes. Patient does report increased thirst and hunger at times. Patient reports that Uncle has diabetes, however, no other family history.   Advised patient to keep scheduled follow up appointment and to discuss concerns with provider tomorrow.   Routing to Dr. Salvadore Dom (provider seeing patient tomorrow)   Veronda Prude, RN

## 2019-11-14 ENCOUNTER — Ambulatory Visit: Payer: BC Managed Care – PPO | Admitting: Family Medicine

## 2019-11-14 ENCOUNTER — Other Ambulatory Visit: Payer: Self-pay

## 2019-11-14 ENCOUNTER — Encounter: Payer: Self-pay | Admitting: Family Medicine

## 2019-11-14 VITALS — BP 128/82 | HR 81 | Wt 123.2 lb

## 2019-11-14 DIAGNOSIS — Z833 Family history of diabetes mellitus: Secondary | ICD-10-CM

## 2019-11-14 DIAGNOSIS — S90822S Blister (nonthermal), left foot, sequela: Secondary | ICD-10-CM

## 2019-11-14 DIAGNOSIS — Z23 Encounter for immunization: Secondary | ICD-10-CM | POA: Diagnosis not present

## 2019-11-14 HISTORY — DX: Blister (nonthermal), left foot, sequela: S90.822S

## 2019-11-14 HISTORY — DX: Family history of diabetes mellitus: Z83.3

## 2019-11-14 LAB — POCT GLYCOSYLATED HEMOGLOBIN (HGB A1C): Hemoglobin A1C: 5 % (ref 4.0–5.6)

## 2019-11-14 NOTE — Assessment & Plan Note (Signed)
Uncle with diabetes. Very nervous that she has diabetes. Discussed extensively that all glucoses including Glucola during pregnancy has been normal. Normal BMI. -A1c 5.0; patient was reassured that she is not pre-diabetic or diabetic

## 2019-11-14 NOTE — Assessment & Plan Note (Addendum)
Improved. Healing. Review UC note and I&D did not produce pus and felt to not be an abscess.  -Instructed to keep padding and cover when wearing shoes but letting air dry when not on feet or at night -Follow up if signs of infection return

## 2019-11-14 NOTE — Patient Instructions (Signed)
It was wonderful to see you today.  Please bring ALL of your medications with you to every visit.   Today we talked about:  Your foot which is healing nicely. Allow to air dry at night. Return if symptoms of infection return.   We discussed your glucose status. Your A1c is 5.0. This is normal. You are not pre-diabetic or diabetic.    Please call the clinic at [phone] if you have any concerns. It was our pleasure to serve you.  Dr. Salvadore Dom

## 2019-11-14 NOTE — Assessment & Plan Note (Signed)
Flu vaccine administered today.

## 2019-11-14 NOTE — Progress Notes (Signed)
    SUBJECTIVE:   CHIEF COMPLAINT / HPI:   Paige Neal presents to [facility] to follow up on the below issues.   Foot blister Recently see in urgent care and had it drained. She is not currently having any issues. She keeps it covered up and completed Keflex course. Feels as if her foot is a different color than the other.   Concern about diabetes Concerned that she may have diabetes. States she has anxiety surrounding maybe having diabetes since she was asked this with her foot blister. She also feels as if she craves sweet things too much.   PERTINENT  PMH / PSH: As above.   OBJECTIVE:   BP 128/82   Pulse 81   Wt 123 lb 3.2 oz (55.9 kg)   LMP 10/25/2019 (Exact Date)   SpO2 98%   BMI 19.89 kg/m   General: Appears well, no acute distress. Age appropriate. Extremities: Left without edema or cyanosis. Bilateral peripheral pulses are intact. Blister on bottom of left foot without erythema or drainage.   ASSESSMENT/PLAN:   Blister of foot without infection, left, sequela Improved. Healing. Review UC note and I&D did not produce pus and felt to not be an abscess.  -Instructed to keep padding and cover when wearing shoes but letting air dry when not on feet or at night -Follow up if signs of infection return  Family history of diabetes mellitus Uncle with diabetes. Very nervous that she has diabetes. Discussed extensively that all glucoses including Glucola during pregnancy has been normal. Normal BMI. -A1c 5.0; patient was reassured that she is not pre-diabetic or diabetic  Need for immunization against influenza -Flu vaccine administered today   Lavonda Jumbo, DO [facility]

## 2019-12-18 ENCOUNTER — Telehealth: Payer: Self-pay

## 2019-12-18 NOTE — Telephone Encounter (Signed)
Patient calls nurse line with complaints of recent nausea for the past week. Patient reports that nausea in inconsistent and comes in waves. Reports last menstrual period on 12-02-08/12. Patient also reports consistently taking birth control pills. Denies abdominal pain, fever, diarrhea or vomiting. Patient reports onset around last Wednesday after coming off period.   Scheduled patient appointment with PCP on 11/3. (Patient will be out of town next week). Patient given return/ ED precautions.   FYI to PCP  Veronda Prude, RN

## 2020-01-01 ENCOUNTER — Encounter: Payer: Self-pay | Admitting: Family Medicine

## 2020-01-01 ENCOUNTER — Ambulatory Visit: Payer: BC Managed Care – PPO | Admitting: Family Medicine

## 2020-01-01 ENCOUNTER — Other Ambulatory Visit: Payer: Self-pay

## 2020-01-01 VITALS — BP 130/75 | HR 74 | Ht 66.0 in | Wt 124.8 lb

## 2020-01-01 DIAGNOSIS — L299 Pruritus, unspecified: Secondary | ICD-10-CM

## 2020-01-01 DIAGNOSIS — R110 Nausea: Secondary | ICD-10-CM | POA: Diagnosis not present

## 2020-01-01 DIAGNOSIS — T7840XS Allergy, unspecified, sequela: Secondary | ICD-10-CM

## 2020-01-01 DIAGNOSIS — Z1159 Encounter for screening for other viral diseases: Secondary | ICD-10-CM

## 2020-01-01 DIAGNOSIS — F439 Reaction to severe stress, unspecified: Secondary | ICD-10-CM

## 2020-01-01 DIAGNOSIS — I10 Essential (primary) hypertension: Secondary | ICD-10-CM

## 2020-01-01 MED ORDER — CETIRIZINE HCL 10 MG PO TABS: 10.0000 mg | ORAL_TABLET | Freq: Every day | ORAL | 11 refills | Status: DC

## 2020-01-01 NOTE — Patient Instructions (Addendum)
It was wonderful to see you today.  For your itching: We are going to try Zyrtec 10 mg daily and see if this makes a difference.  Continue to keep an eye out to see if you notice any triggers that may be causing this.  We will also just check some labs today to make sure this is not contributing as well.  For your nausea: We will continue to keep an eye on this.  You can take your pregnancy test at home in the next 2 weeks, however this is less likely.  Again, please see if there are any particular triggers.  We can always try a scopolamine patch in the future particularly with motion sickness if this recurs.  Please try to schedule an appointment with his therapist as soon as possible, is important for you to make time to make sure that you take care of yourself.  We can always discuss trying a medication to help ease your anxiety in the future if desired.

## 2020-01-01 NOTE — Progress Notes (Signed)
SUBJECTIVE:   CHIEF COMPLAINT / HPI: Nausea   Paige Neal is a 39 year old female presenting to discuss nausea.  She initially called into our nurses line on 10/20 due to nausea intermittently for the past week without any additional abdominal pain, fever, vomiting, or change in bowel movements.  She had recently had her menstrual cycle on 10/5 through 10/12 with consistent use of her Micronor. Most recent menstrual cycle started on 10/28 and currently still on it. Normal flow for her.   Today, she reports she continues to have intermittent nausea.  The nausea seems to be better but still intermittently present, now seems to have it with car rides only.  Does not have a history of motion sickness, last had it when she was pregnant with her son in 2019.  She has not tried any medications.  No additional symptoms other than itchiness as below.  Still no associated abdominal pain, vomiting, change in bowel movement, breast tenderness, fever, or feeling ill/fatigued.  She does feel like she has been more stressed than usual in the past several weeks, and becomes been tearful at the discussion that she feels overwhelmed with her 68-month at home in addition to work.  Sees a therapist every couple with her husband but not for herself, does not feel like she has time for stress relieving activities.  Does report chronic stress at baseline to begin with.  Sexually active with husband.  She also states around the same time the nausea came on she started having itching on her arms and legs. She also reports the whole body itching started after she laid on a blanket that her son sprayed a new spray on.  She states her skin seems to be sensitive and wonder if this was contributing, can't remember if she initially had a rash but knows she did not have hives.  Denies any difficulty breathing, throat/tongue swelling.  The itching will come on randomly, did take Benadryl with improvement.  GAD 7 : Generalized  Anxiety Score 01/01/2020 04/12/2018 03/22/2018 02/23/2018  Nervous, Anxious, on Edge 0 1 1 0  Control/stop worrying 0 0 0 0  Worry too much - different things 1 0 0 0  Trouble relaxing 0 0 0 0  Restless 0 0 0 0  Easily annoyed or irritable 2 1 1  0  Afraid - awful might happen 0 0 0 0  Total GAD 7 Score 3 2 2  0  Anxiety Difficulty Not difficult at all - - -     Office Visit from 01/01/2020 in [facility] Office Visit from 10/03/2019 in [facility] Routine Prenatal from [facility]/12/2018 in [facility]  Thoughts that you would be better off dead, or of hurting yourself in some way Not at all Not at all --  PHQ-9 Total Score 2 0 3      PERTINENT  PMH / PSH: Hypertension, benign breast lump   OBJECTIVE:   BP 130/75    Pulse 74    Ht 5\' 6"  (1.676 m)    Wt 124 lb 12.8 oz (56.6 kg)    LMP 01/01/2020 (Exact Date)    SpO2 99%    BMI 20.14 kg/m   General: Alert, NAD HEENT: NCAT, MMM Cardiac: RRR no m/g/r Lungs: Clear bilaterally, no increased WOB  Abdomen: soft, non-tender, negative Murphy sign, non-distended, normoactive BS Msk: Moves all extremities spontaneously  Ext: Warm, dry, 2+ distal pulses Derm: No rashes seen  Psych:  Tearful at times when discussing stress, otherwise normal mood and affect  ASSESSMENT/PLAN:   Nausea Acute and intermittent, sole symptom with the exception of pruritus as below.  Benign abdominal exam.  Unclear etiology, considering intermenstrual changes, atypical reflux, allergic irritant, increased stressors/anxiety, pregnancy.  While quite low suspicion for cholestasis/obstruction, given nausea and generalized pruritus, will obtain CMP.  Less likely pregnancy given on her menstrual cycle as scheduled, however recommended at home Upreg in 2 weeks as she felt similar with her previous pregnancy.  Recommended monitoring for recurrence and keeping journal of any food or movement triggers, could try  tums/Gaviscon.  May need to try scopolamine patch in the future for motion sickness.  Pruritus Acute, intermittent.  As a started after possible allergic irritant and improvement with Benadryl, certainly could consider smoldering allergy.  Recommended trial of daily Zyrtec for the next several weeks to see if this makes a difference.  Additionally should monitor for any triggers that seem to cause her itching.  Stress Reports increased stress due to multiple work and home stressors.  Unclear if this may be manifesting physically, however less likely especially as patient reports it is not uncommon for her to feel overwhelmed and has not experienced these symptoms before.  Provided supportive listening and recommended meeting with her therapist by herself in addition to with her husband as already scheduled.  Encouraged stress relieving activities such as meditation and breathing techniques.  Could consider SSRI in the future.  Essential hypertension Well-controlled on Norvasc 5 mg only.  BP 130/75, could always consider increasing to 10 mg in the future if SBP >130 consistently.   Encounter for hepatitis C screening test for low risk patient Obtain hepatitis C screen today.    Follow-up in 1 month to check in on above or sooner if progressive/continually bothersome.  Allayne Stack, DO [place] [facility]

## 2020-01-02 ENCOUNTER — Encounter: Payer: Self-pay | Admitting: Family Medicine

## 2020-01-02 DIAGNOSIS — R110 Nausea: Secondary | ICD-10-CM

## 2020-01-02 DIAGNOSIS — Z1159 Encounter for screening for other viral diseases: Secondary | ICD-10-CM

## 2020-01-02 DIAGNOSIS — L299 Pruritus, unspecified: Secondary | ICD-10-CM | POA: Insufficient documentation

## 2020-01-02 DIAGNOSIS — F439 Reaction to severe stress, unspecified: Secondary | ICD-10-CM | POA: Insufficient documentation

## 2020-01-02 HISTORY — DX: Reaction to severe stress, unspecified: F43.9

## 2020-01-02 HISTORY — DX: Pruritus, unspecified: L29.9

## 2020-01-02 HISTORY — DX: Nausea: R11.0

## 2020-01-02 HISTORY — DX: Encounter for screening for other viral diseases: Z11.59

## 2020-01-02 NOTE — Assessment & Plan Note (Addendum)
Reports increased stress due to multiple work and home stressors.  Unclear if this may be manifesting physically, however less likely especially as patient reports it is not uncommon for her to feel overwhelmed and has not experienced these symptoms before.  Provided supportive listening and recommended meeting with her therapist by herself in addition to with her husband as already scheduled.  Encouraged stress relieving activities such as meditation and breathing techniques.  Could consider SSRI in the future.

## 2020-01-02 NOTE — Assessment & Plan Note (Signed)
Well-controlled on Norvasc 5 mg only.  BP 130/75, could always consider increasing to 10 mg in the future if SBP >130 consistently.

## 2020-01-02 NOTE — Assessment & Plan Note (Signed)
Acute, intermittent.  As a started after possible allergic irritant and improvement with Benadryl, certainly could consider smoldering allergy.  Recommended trial of daily Zyrtec for the next several weeks to see if this makes a difference.  Additionally should monitor for any triggers that seem to cause her itching.

## 2020-01-02 NOTE — Assessment & Plan Note (Signed)
Obtain hepatitis C screen today.

## 2020-01-02 NOTE — Assessment & Plan Note (Addendum)
Acute and intermittent, sole symptom with the exception of pruritus as below.  Benign abdominal exam.  Unclear etiology, considering intermenstrual changes, atypical reflux, allergic irritant, increased stressors/anxiety, pregnancy.  While quite low suspicion for cholestasis/obstruction, given nausea and generalized pruritus, will obtain CMP.  Less likely pregnancy given on her menstrual cycle as scheduled, however recommended at home Upreg in 2 weeks as she felt similar with her previous pregnancy.  Recommended monitoring for recurrence and keeping journal of any food or movement triggers, could try tums/Gaviscon.  May need to try scopolamine patch in the future for motion sickness.

## 2020-01-04 LAB — COMPREHENSIVE METABOLIC PANEL
ALT: 11 IU/L (ref 0–32)
AST: 15 IU/L (ref 0–40)
Albumin/Globulin Ratio: 1.5 (ref 1.2–2.2)
Albumin: 4.5 g/dL (ref 3.8–4.8)
Alkaline Phosphatase: 76 IU/L (ref 44–121)
BUN/Creatinine Ratio: 17 (ref 9–23)
BUN: 12 mg/dL (ref 6–20)
Bilirubin Total: 0.3 mg/dL (ref 0.0–1.2)
CO2: 24 mmol/L (ref 20–29)
Calcium: 9.4 mg/dL (ref 8.7–10.2)
Chloride: 104 mmol/L (ref 96–106)
Creatinine, Ser: 0.72 mg/dL (ref 0.57–1.00)
GFR calc Af Amer: 123 mL/min/{1.73_m2} (ref >59)
GFR calc non Af Amer: 107 mL/min/{1.73_m2} (ref >59)
Globulin, Total: 3.1 g/dL (ref 1.5–4.5)
Glucose: 96 mg/dL (ref 65–99)
Potassium: 4 mmol/L (ref 3.5–5.2)
Sodium: 144 mmol/L (ref 134–144)
Total Protein: 7.6 g/dL (ref 6.0–8.5)

## 2020-01-04 LAB — HCV INTERPRETATION

## 2020-01-04 LAB — HCV AB W REFLEX TO QUANT PCR: HCV Ab: 0.2 s/co ratio (ref 0.0–0.9)

## 2020-01-17 ENCOUNTER — Other Ambulatory Visit: Payer: Self-pay | Admitting: Cardiology

## 2020-02-03 ENCOUNTER — Other Ambulatory Visit: Payer: Self-pay | Admitting: Obstetrics

## 2020-02-11 ENCOUNTER — Other Ambulatory Visit: Payer: Self-pay | Admitting: Cardiology

## 2020-03-11 ENCOUNTER — Encounter: Payer: Self-pay | Admitting: Family Medicine

## 2020-03-17 ENCOUNTER — Other Ambulatory Visit: Payer: Self-pay | Admitting: Cardiology

## 2020-03-18 ENCOUNTER — Other Ambulatory Visit: Payer: Self-pay

## 2020-03-18 MED ORDER — AMLODIPINE BESYLATE 5 MG PO TABS
5.0000 mg | ORAL_TABLET | Freq: Every day | ORAL | 0 refills | Status: DC
Start: 2020-03-18 — End: 2020-04-02

## 2020-03-18 NOTE — Telephone Encounter (Signed)
Rx refill sent to pharmacy. 

## 2020-03-23 ENCOUNTER — Other Ambulatory Visit: Payer: BC Managed Care – PPO

## 2020-03-23 DIAGNOSIS — Z20822 Contact with and (suspected) exposure to covid-19: Secondary | ICD-10-CM

## 2020-03-24 ENCOUNTER — Telehealth: Payer: Self-pay | Admitting: Cardiology

## 2020-03-24 LAB — NOVEL CORONAVIRUS, NAA: SARS-CoV-2, NAA: NOT DETECTED

## 2020-03-24 LAB — SARS-COV-2, NAA 2 DAY TAT

## 2020-03-24 NOTE — Telephone Encounter (Signed)
Patient is overdue for a follow up appointment with Dr. Tomie China (last seen 12/27/18). She would like to know if she can be seen virtually for her next appointment to avoid having to switch providers. She states she typically schedules her appointments for days she has to work. She works in [employer], but she has been working from home in [place] and it is difficult for her to travel to [employer]. Is Dr. Tomie China able to see her virtually? Please advise.

## 2020-03-25 NOTE — Telephone Encounter (Signed)
yes

## 2020-03-26 DIAGNOSIS — F410 Panic disorder [episodic paroxysmal anxiety] without agoraphobia: Secondary | ICD-10-CM | POA: Insufficient documentation

## 2020-03-26 DIAGNOSIS — R000 Tachycardia, unspecified: Secondary | ICD-10-CM | POA: Insufficient documentation

## 2020-03-26 DIAGNOSIS — I10 Essential (primary) hypertension: Secondary | ICD-10-CM | POA: Insufficient documentation

## 2020-03-26 DIAGNOSIS — O210 Mild hyperemesis gravidarum: Secondary | ICD-10-CM | POA: Insufficient documentation

## 2020-03-26 NOTE — Telephone Encounter (Signed)
Patient has been scheduled for a MyChart Virtual visit with Dr. Tomie China on 03/27/20 at 1pm.

## 2020-03-27 ENCOUNTER — Telehealth: Payer: Self-pay

## 2020-03-27 ENCOUNTER — Encounter: Payer: Self-pay | Admitting: Cardiology

## 2020-03-27 ENCOUNTER — Telehealth (INDEPENDENT_AMBULATORY_CARE_PROVIDER_SITE_OTHER): Payer: BC Managed Care – PPO | Admitting: Cardiology

## 2020-03-27 VITALS — BP 126/84 | HR 92 | Ht 66.0 in | Wt 122.0 lb

## 2020-03-27 DIAGNOSIS — I10 Essential (primary) hypertension: Secondary | ICD-10-CM

## 2020-03-27 NOTE — Patient Instructions (Signed)

## 2020-03-27 NOTE — Telephone Encounter (Signed)
  Patient Consent for Virtual Visit         Paige Neal has provided verbal consent on 03/27/2020 for a virtual visit (video or telephone).   CONSENT FOR VIRTUAL VISIT FOR:  Paige Neal  By participating in this virtual visit I agree to the following:  I hereby voluntarily request, consent and authorize [facility] and its employed or contracted physicians, physician assistants, nurse practitioners or other licensed health care professionals (the Practitioner), to provide me with telemedicine health care services (the "Services") as deemed necessary by the treating Practitioner. I acknowledge and consent to receive the Services by the Practitioner via telemedicine. I understand that the telemedicine visit will involve communicating with the Practitioner through live audiovisual communication technology and the disclosure of certain medical information by electronic transmission. I acknowledge that I have been given the opportunity to request an in-person assessment or other available alternative prior to the telemedicine visit and am voluntarily participating in the telemedicine visit.  I understand that I have the right to withhold or withdraw my consent to the use of telemedicine in the course of my care at any time, without affecting my right to future care or treatment, and that the Practitioner or I may terminate the telemedicine visit at any time. I understand that I have the right to inspect all information obtained and/or recorded in the course of the telemedicine visit and may receive copies of available information for a reasonable fee.  I understand that some of the potential risks of receiving the Services via telemedicine include:  Marland Kitchen Delay or interruption in medical evaluation due to technological equipment failure or disruption; . Information transmitted may not be sufficient (e.g. poor resolution of images) to allow for appropriate medical decision making by the  Practitioner; and/or  . In rare instances, security protocols could fail, causing a breach of personal health information.  Furthermore, I acknowledge that it is my responsibility to provide information about my medical history, conditions and care that is complete and accurate to the best of my ability. I acknowledge that Practitioner's advice, recommendations, and/or decision may be based on factors not within their control, such as incomplete or inaccurate data provided by me or distortions of diagnostic images or specimens that may result from electronic transmissions. I understand that the practice of medicine is not an exact science and that Practitioner makes no warranties or guarantees regarding treatment outcomes. I acknowledge that a copy of this consent can be made available to me via my patient portal [facility] MyChart), or I can request a printed copy by calling the office of [facility].    I understand that my insurance will be billed for this visit.   I have read or had this consent read to me. . I understand the contents of this consent, which adequately explains the benefits and risks of the Services being provided via telemedicine.  . I have been provided ample opportunity to ask questions regarding this consent and the Services and have had my questions answered to my satisfaction. . I give my informed consent for the services to be provided through the use of telemedicine in my medical care

## 2020-03-27 NOTE — Progress Notes (Signed)
Virtual Visit via Video Note   This visit type was conducted due to national recommendations for restrictions regarding the COVID-19 Pandemic (e.g. social distancing) in an effort to limit this patient's exposure and mitigate transmission in our community.  Due to her co-morbid illnesses, this patient is at least at moderate risk for complications without adequate follow up.  This format is felt to be most appropriate for this patient at this time.  All issues noted in this document were discussed and addressed.  A limited physical exam was performed with this format.  Please refer to the patient's chart for her consent to telehealth for [facility].       Date:  03/27/2020   ID:  Paige Neal, DOB [birth year], MRN [id number] The patient was identified using 2 identifiers.  Patient Location: Home Provider Location: Office/Clinic  PCP:  Allayne Stack, DO  Cardiologist:  No primary care provider on file.  Electrophysiologist:  None   Evaluation Performed:  Follow-Up Visit  Chief Complaint: Essential hypertension  History of Present Illness:    Paige Neal is a 40 y.o. female with past medical history of essential hypertension.  Patient denies any problems at this time and takes care of activities of daily living.  No chest pain orthopnea or PND.  She leads a sedentary lifestyle.  At the time of my evaluation, the patient is alert awake oriented and in no distress.  The patient does not have symptoms concerning for COVID-19 infection (fever, chills, cough, or new shortness of breath).    Past Medical History:  Diagnosis Date  . Abnormal uterine bleeding (AUB) 10/03/2019  . Blister of foot without infection, left, sequela 11/14/2019  . Breast mass 06/27/2011   EXAM: 08/2013 DIGITAL DIAGNOSTIC BILATERAL MAMMOGRAM WITH 3D TOMOSYNTHESIS WITH CAD ULTRASOUND LEFT BREAST COMPARISON: 07/05/2011. 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 RECOMMENDATION: Left breast  ultrasound in 6 months. The options of ultrasound-guided core needle biopsy and surgical excision were also discussed with the patient but not recommended at this time. She is currently co  . Encounter for hepatitis C screening test for low risk patient 01/02/2020  . Essential hypertension 12/01/2017  . Family history of diabetes mellitus 11/14/2019  . Foot abscess, left 11/05/2019  . Galactorrhea 05/27/2019  . HTN (hypertension)   . Hyperemesis gravidarum   . Leukopenia 02/01/2019  . Nausea 01/02/2020  . Panic attack   . Pruritus 01/02/2020  . Stress 01/02/2020  . Tachycardia   . Vaginal lump 09/16/2018   Past Surgical History:  Procedure Laterality Date  . BREAST EXCISIONAL BIOPSY Bilateral   . BREAST FIBROADENOMA SURGERY    . gardisil x 3 - 08/30/2005       Current Meds  Medication Sig  . amLODipine (NORVASC) 5 MG tablet Take 1 tablet (5 mg total) by mouth daily.  . cetirizine (ZYRTEC) 10 MG tablet Take 1 tablet (10 mg total) by mouth daily.  . ferrous sulfate 325 (65 FE) MG tablet TAKE 1 TABLET BY MOUTH EVERY DAY WITH BREAKFAST  . norethindrone (MICRONOR) 0.35 MG tablet TAKE 1 TABLET BY MOUTH EVERY DAY  . prenatal vitamin w/FE, FA (PRENATAL 1 + 1) 27-1 MG TABS tablet Take 2 tablets by mouth daily at 12 noon.      Allergies:   Patient has no known allergies.   Social History   Tobacco Use  . Smoking status: Never Smoker  . Smokeless tobacco: Never Used  Vaping Use  . Vaping Use: Never used  Substance Use Topics  . Alcohol use: No  . Drug use: No     Family Hx: The patient's family history includes Coronary artery disease in her mother; Hypertension in her father, mother, and sister.  ROS:   Please see the history of present illness.    I discussed my findings with the patient at length All other systems reviewed and are negative.   Prior CV studies:   The following studies were reviewed today:  EKG: Sinus rhythm and nonspecific ST-T changes  Labs/Other Tests and Data  Reviewed:    EKG:  EKG from prior visit was reviewed  Recent Labs: 01/01/2020: ALT 11; BUN 12; Creatinine, Ser 0.72; Potassium 4.0; Sodium 144   Recent Lipid Panel Lab Results  Component Value Date/Time   CHOL 130 09/05/2017 12:00 AM   TRIG 58 09/05/2017 12:00 AM   HDL 54 09/05/2017 12:00 AM   CHOLHDL 2.4 09/05/2017 12:00 AM   CHOLHDL 2.9 Ratio 05/16/2007 06:04 PM   LDLCALC 64 09/05/2017 12:00 AM    Wt Readings from Last 3 Encounters:  03/27/20 122 lb (55.3 kg)  01/01/20 124 lb 12.8 oz (56.6 kg)  11/14/19 123 lb 3.2 oz (55.9 kg)     Risk Assessment/Calculations:      Objective:    Vital Signs:  BP 140/89   Pulse 92   Ht 5\' 6"  (1.676 m)   Wt 122 lb (55.3 kg)   BMI 19.69 kg/m    VITAL SIGNS:  reviewed  ASSESSMENT & PLAN:    1. Primary prevention stressed with the patient.  Importance of compliance with diet medication stressed and she vocalized understanding. 2. Essential hypertension: Blood pressure stable and diet was emphasized.  Salt intake issues and regular exercise was stressed.  She promises to walk at least half an hour a day 5 days a week.  She is under some stress because of her job and the fact that she is trying to manage her 87-month-old.  And she feels that life is a little hectic at this time.  Lifestyle modification and stress management issues were discussed with 3. Patient will be seen in follow-up appointment in 6 months or earlier if the patient has any concerns         COVID-19 Education: The signs and symptoms of COVID-19 were discussed with the patient and how to seek care for testing (follow up with PCP or arrange E-visit).  The importance of social distancing was discussed today.  Time:   Today, I have spent 15 minutes with the patient with telehealth technology discussing the above problems.     Medication Adjustments/Labs and Tests Ordered: Current medicines are reviewed at length with the patient today.  Concerns regarding medicines  are outlined above.   Tests Ordered: No orders of the defined types were placed in this encounter.   Medication Changes: No orders of the defined types were placed in this encounter.   Follow Up:  In Person in 6 month(s)  Signed, 38-month, MD  03/27/2020 1:20 PM    [facility]

## 2020-03-30 ENCOUNTER — Other Ambulatory Visit: Payer: Self-pay

## 2020-03-30 ENCOUNTER — Other Ambulatory Visit: Payer: BC Managed Care – PPO

## 2020-03-30 DIAGNOSIS — Z20822 Contact with and (suspected) exposure to covid-19: Secondary | ICD-10-CM

## 2020-03-31 ENCOUNTER — Telehealth: Payer: Self-pay | Admitting: *Deleted

## 2020-03-31 NOTE — Telephone Encounter (Signed)
Pt calling for covid results; active, pending. Pt verbalizes understanding.

## 2020-04-01 LAB — SARS-COV-2, NAA 2 DAY TAT

## 2020-04-01 LAB — NOVEL CORONAVIRUS, NAA: SARS-CoV-2, NAA: NOT DETECTED

## 2020-04-02 ENCOUNTER — Other Ambulatory Visit: Payer: Self-pay

## 2020-04-02 MED ORDER — AMLODIPINE BESYLATE 5 MG PO TABS: 5.0000 mg | ORAL_TABLET | Freq: Every day | ORAL | 0 refills | Status: DC

## 2020-04-02 NOTE — Telephone Encounter (Signed)
Refill sent to pharmacy.   

## 2020-05-26 ENCOUNTER — Other Ambulatory Visit: Payer: Self-pay

## 2020-05-26 MED ORDER — NORETHINDRONE 0.35 MG PO TABS: 1.0000 | ORAL_TABLET | Freq: Every day | ORAL | 3 refills | Status: DC

## 2020-06-30 ENCOUNTER — Other Ambulatory Visit: Payer: Self-pay | Admitting: Cardiology

## 2020-06-30 NOTE — Telephone Encounter (Signed)
Refill sent to pharmacy.   

## 2020-07-20 ENCOUNTER — Encounter: Payer: Self-pay | Admitting: Family Medicine

## 2020-07-20 ENCOUNTER — Ambulatory Visit (INDEPENDENT_AMBULATORY_CARE_PROVIDER_SITE_OTHER): Payer: BC Managed Care – PPO | Admitting: Family Medicine

## 2020-07-20 ENCOUNTER — Other Ambulatory Visit: Payer: Self-pay

## 2020-07-20 VITALS — BP 130/80 | HR 85 | Wt 135.2 lb

## 2020-07-20 DIAGNOSIS — L299 Pruritus, unspecified: Secondary | ICD-10-CM

## 2020-07-20 DIAGNOSIS — Z0000 Encounter for general adult medical examination without abnormal findings: Secondary | ICD-10-CM | POA: Insufficient documentation

## 2020-07-20 HISTORY — DX: Encounter for general adult medical examination without abnormal findings: Z00.00

## 2020-07-20 NOTE — Progress Notes (Signed)
    SUBJECTIVE:   Chief compliant/HPI: annual examination  Paige Neal is a 40 y.o. who presents today for an annual exam.  Overall doing well, stays busy with work.  Lifelong non-smoker.  Does not drink alcohol.  Tries to follow a balanced diet.  Stays active with work and doing chores at home, no formal exercise.  Does not desire any STD screening today.  Feels safe at home.  Reviewed and updated history yes.   Flowsheet Row Office Visit from 07/20/2020 in [facility] Office Visit from 01/01/2020 in [facility] Office Visit from 10/03/2019 in [facility]  Thoughts that you would be better off dead, or of hurting yourself in some way Not at all Not at all Not at all  PHQ-9 Total Score 3 2 0      OBJECTIVE:   BP 130/80   Pulse 85   Wt 135 lb 3.2 oz (61.3 kg)   LMP 07/19/2020 (LMP Unknown)   SpO2 95%   BMI 21.82 kg/m    General: Alert, NAD HEENT: NCAT, MMM, thyroid not enlarged without nodules Cardiac: RRR no m/g/r Lungs: Clear bilaterally, no increased WOB  Abdomen: soft, non-tender Msk: Moves all extremities spontaneously, normal gait  Ext: Warm, dry, 2+ distal pulses, no edema  Psych: Normal mood and affect, good eye contact  ASSESSMENT/PLAN:   Annual physical exam See AVS for age appropriate recommendations  PHQ score 3 (0 to #9), reviewed.  Blood pressure reviewed and at goal.   Declined STD screening today, with exception of hepatitis B screening ordered and low risk patient. Lipid panel UTD, last normal in 2019.  Encouraged balanced diet and adequate exercise, may do in small bursts throughout the day rather than dedicated solitary time. UTD on Pap smear, due in 2 years with cotesting.  Start mammogram at 40 years of age per patient preference, no family history of breast cancer.   Pruritus Initially evaluated for this concern 12/2019.  At that time we started a trial of daily Zyrtec which  completely resolved symptoms, however recurs when she is not taking the medication.  Would benefit from evaluation by allergy to assess for underlying persistent allergen that she may possibly be able to avoid.  Discussed that often times a trigger cannot always be found and that Zyrtec would be safe for her to continue to use.  Referral placed.     Follow-up in 1 year or sooner if needed.   Allayne Stack, DO  [facility]

## 2020-07-20 NOTE — Assessment & Plan Note (Signed)
Initially evaluated for this concern 12/2019.  At that time we started a trial of daily Zyrtec which completely resolved symptoms, however recurs when she is not taking the medication.  Would benefit from evaluation by allergy to assess for underlying persistent allergen that she may possibly be able to avoid.  Discussed that often times a trigger cannot always be found and that Zyrtec would be safe for her to continue to use.  Referral placed.

## 2020-07-20 NOTE — Assessment & Plan Note (Addendum)
See AVS for age appropriate recommendations  PHQ score 3 (0 to #9), reviewed.  Blood pressure reviewed and at goal.   Declined STD screening today, with exception of hepatitis B screening ordered and low risk patient. Lipid panel UTD, last normal in 2019.  Encouraged balanced diet and adequate exercise, may do in small bursts throughout the day rather than dedicated solitary time. UTD on Pap smear, due in 2 years with cotesting.  Start mammogram at 40 years of age per patient preference, no family history of breast cancer.

## 2020-07-20 NOTE — Patient Instructions (Addendum)
It was wonderful to see you!   Keep up the great work with handling stress through your work and at home.    Exercise handout provided separately through ACSM Exercise is Medicine Series, link below for further information and resources. Share with your family and friends!   [email or web address]

## 2020-10-12 ENCOUNTER — Encounter: Payer: Self-pay | Admitting: Allergy

## 2020-10-12 ENCOUNTER — Ambulatory Visit: Payer: BC Managed Care – PPO | Admitting: Allergy

## 2020-10-12 ENCOUNTER — Other Ambulatory Visit: Payer: Self-pay

## 2020-10-12 VITALS — BP 120/80 | HR 65 | Temp 97.5°F | Ht 66.0 in | Wt 136.2 lb

## 2020-10-12 DIAGNOSIS — J3089 Other allergic rhinitis: Secondary | ICD-10-CM

## 2020-10-12 DIAGNOSIS — L299 Pruritus, unspecified: Secondary | ICD-10-CM | POA: Diagnosis not present

## 2020-10-12 NOTE — Progress Notes (Signed)
New Patient Note  RE: Paige Neal MRN: [id number] DOB: 1980-06-08 Date of Office Visit: 10/12/2020  Consult requested by: Moses Manners, MD Primary care provider: Cora Collum, DO  Chief Complaint: Establish Care (Patient referred by PCP pruritis since November 2021. Zyrtec works but if she misses a dose she can tell the difference.)  History of Present Illness: I had the pleasure of seeing Paige Neal for initial evaluation at the [facility] on 10/13/2020. She is a 40 y.o. female, who is referred here by Cora Collum, DO for the evaluation of pruritus.  Itching started about November 2021. This can occur anywhere on her body. Describes them as itching only without any rash.  Itching is constant throughout the day if she is off zyrtec.   Patient states the night it started, her husband changed the bedding after her child had an accident and used some type of chemical spray. She washed the bedding since then but still has the itching.   Associated symptoms include: she is not sure if this is related but since then she felt nauseous at times especially when in a long car drive.. Suspected triggers are unknown. Denies any fevers, chills, changes in medications, foods, personal care products or recent infections. She has tried the following therapies: zyrtec 10mg  daily with good benefit. Systemic steroids no. Currently on zyrtec 10mg  daily.  Previous work up includes: none. Previous history of rash/hives: denies. Patient is up to date with the following cancer screening tests: physical exam, pap smears.  07/20/2020 PCP visit: "Initially evaluated for this concern 12/2019.  At that time we started a trial of daily Zyrtec which completely resolved symptoms, however recurs when she is not taking the medication.  Would benefit from evaluation by allergy to assess for underlying persistent allergen that she may possibly be able to avoid.  Discussed that  often times a trigger cannot always be found and that Zyrtec would be safe for her to continue to use.  Referral placed."  Assessment and Plan: Taje is a 40 y.o. female with: Pruritus Pruritus without any rash starting in November 2021.  This occurs on a daily basis if not taking Zyrtec.  Concerned about allergic triggers.  Denies any fevers, chills, changes in medications, diet or personal care products around this time. Today's skin prick testing showed: Positive to grass, weed pollen, trees, cat. Negative to common foods. Discussed with patient that the above allergens do not explain her symptoms as she does not have a cat and symptoms started when there are no pollen outdoors.  Not sure what's causing the itching.  See below for proper skin care. Re-start zyrtec 10mg  once a day. If no issues after 1 month you may try to take zyrtec every other day and wean off the medication.  Get bloodwork to rule out other etiologies.   Other allergic rhinitis Rhinoconjunctivitis symptoms during the spring and summer and takes over-the-counter antihistamines with good benefit.  No prior allergy testing. Today skin prick testing was positive for grass, weed, trees and cat. Patient has sensitive skin, will get bloodwork instead of intradermal testing. Start environmental control measures as below. Use over the counter antihistamines such as Zyrtec (cetirizine), Claritin (loratadine), Allegra (fexofenadine), or Xyzal (levocetirizine) daily as needed. May take twice a day during allergy flares. May switch antihistamines every few months.  Return in about 4 months (around 02/11/2021).  No orders of the defined types were placed in this encounter.  Lab Orders         Allergens w/Total IgE Area 2         Alpha-Gal Panel         CBC with Differential/Platelet         Chronic Urticaria         Comprehensive metabolic panel         Tryptase         Thyroid Cascade Profile         Sedimentation rate          ANA w/Reflex      Other allergy screening: Asthma: no Rhino conjunctivitis: yes Itchy eyes, rhinorrhea, throat irritation mainly in the spring and summer takes OTC antihistamines with good benefit.  No prior allergy testing.   Food allergy: no Medication allergy: no Hymenoptera allergy: no Urticaria: no Eczema:no History of recurrent infections suggestive of immunodeficency: no  Diagnostics: Skin Testing: Environmental allergy panel and select foods. Positive to grass, weed pollen, trees, cat. Negative to common foods. Results discussed with patient/family.  Airborne Adult Perc - 10/12/20 1524     Time Antigen Placed 1524    Allergen Manufacturer Waynette Buttery    Location Back    Number of Test 59    Panel 1 Select    1. Control-Buffer 50% Glycerol Negative    2. Control-Histamine 1 mg/ml 2+    3. Albumin saline Negative    4. Bahia 4+    5. [place] --   +/-   6. Johnson 4+    7. Kentucky Blue 4+    8. Meadow Fescue Negative    9. Perennial Rye Negative    10. Sweet Vernal 4+    11. Timothy 3+    12. Cocklebur --   +/-   13. Burweed Marshelder Negative    14. Ragweed, short Negative    15. Ragweed, Giant Negative    16. Plantain,  English 2+    17. Lamb's Quarters Negative    18. Sheep Sorrell Negative    19. Rough Pigweed Negative    20. Marsh Elder, Rough Negative    21. Mugwort, Common --   +/-   22. Ash mix 2+    23. Birch mix Negative    24. Beech American Negative    25. Box, Elder Negative    26. Cedar, red Negative    27. Cottonwood, Guinea-Bissau Negative    28. Elm mix Negative    29. Hickory Negative    30. Maple mix Negative    31. Oak, Guinea-Bissau mix Negative    32. Pecan Pollen Negative    33. Pine mix Negative    34. Sycamore Eastern Negative    35. Walnut, Black Pollen Negative    36. Alternaria alternata Negative    37. Cladosporium Herbarum Negative    38. Aspergillus mix Negative    39. Penicillium mix Negative    40. Bipolaris sorokiniana  (Helminthosporium) Negative    41. Drechslera spicifera (Curvularia) Negative    42. Mucor plumbeus Negative    43. Fusarium moniliforme Negative    44. Aureobasidium pullulans (pullulara) Negative    45. Rhizopus oryzae Negative    46. Botrytis cinera Negative    47. Epicoccum nigrum Negative    48. Phoma betae Negative    49. Candida Albicans Negative    50. Trichophyton mentagrophytes Negative    51. Mite, D Farinae  5,000 AU/ml Negative    52. Mite, D Pteronyssinus  5,000 AU/ml  Negative    53. Cat Hair 10,000 BAU/ml 4+    54.  Dog Epithelia Negative    55. Mixed Feathers Negative    56. Horse Epithelia Negative    57. Cockroach, German Negative    58. Mouse Negative    59. Tobacco Leaf Negative             Food Perc - 10/12/20 1524       Test Information   Time Antigen Placed 1525    Allergen Manufacturer Waynette Buttery    Location Back    Number of allergen test 10    Food Select      Food   1. Peanut Negative    2. Soybean food Negative    3. Wheat, whole Negative    4. Sesame Negative    5. Milk, cow Negative    6. Egg White, chicken Negative    7. Casein Negative    8. Shellfish mix Negative    9. Fish mix Negative    10. Cashew Negative             Past Medical History: Patient Active Problem List   Diagnosis Date Noted   Annual physical exam 07/20/2020   Hyperemesis gravidarum    Panic attack    Nausea 01/02/2020   Pruritus 01/02/2020   Stress 01/02/2020   Encounter for hepatitis C screening test for low risk patient 01/02/2020   Family history of diabetes mellitus 11/14/2019   Essential hypertension 12/01/2017   Breast mass 06/27/2011   Other allergic rhinitis 06/01/2009   Past Medical History:  Diagnosis Date   Abnormal uterine bleeding (AUB) 10/03/2019   Blister of foot without infection, left, sequela 11/14/2019   Breast mass 06/27/2011   EXAM: 08/2013 DIGITAL DIAGNOSTIC BILATERAL MAMMOGRAM WITH 3D TOMOSYNTHESIS WITH CAD ULTRASOUND LEFT BREAST  COMPARISON: 07/05/2011. 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 RECOMMENDATION: Left breast ultrasound in 6 months. The options of ultrasound-guided core needle biopsy and surgical excision were also discussed with the patient but not recommended at this time. 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   Nausea 01/02/2020   Panic attack    Pruritus 01/02/2020   Stress 01/02/2020   Tachycardia    Vaginal lump 09/16/2018   Vaginal lump 09/16/2018   Past Surgical History: Past Surgical History:  Procedure Laterality Date   BREAST EXCISIONAL BIOPSY Bilateral    BREAST FIBROADENOMA SURGERY     gardisil x 3 - 08/30/2005     Medication List:  Current Outpatient Medications  Medication Sig Dispense Refill   amLODipine (NORVASC) 5 MG tablet TAKE 1 TABLET (5 MG TOTAL) BY MOUTH DAILY. 90 tablet 2   cetirizine (ZYRTEC) 10 MG tablet Take 1 tablet (10 mg total) by mouth daily. 30 tablet 11   ferrous sulfate 325 (65 FE) MG tablet TAKE 1 TABLET BY MOUTH EVERY DAY WITH BREAKFAST 60 tablet 11   norethindrone (MICRONOR) 0.35 MG tablet Take 1 tablet (0.35 mg total) by mouth daily. 84 tablet 3   prenatal vitamin w/FE, FA (PRENATAL 1 + 1) 27-1 MG TABS tablet Take 2 tablets by mouth daily at 12 noon.      No current facility-administered medications for this visit.  Allergies: No Known Allergies Social History: Social History   Socioeconomic History   Marital status: Married    Spouse name: Not on file   Number of children: Not on file   Years of education: Not on file   Highest education level: Not on file  Occupational History   Not on file  Tobacco Use   Smoking status: Never   Smokeless tobacco: Never  Vaping Use    Vaping Use: Never used  Substance and Sexual Activity   Alcohol use: No   Drug use: No   Sexual activity: Yes    Birth control/protection: None  Other Topics Concern   Not on file  Social History Narrative   [place] in [place] and works at [employer],  single   Social Determinants of Corporate investment bankerHealth   Financial Resource Strain: Not on file  Food Insecurity: Not on file  Transportation Needs: Not on file  Physical Activity: Not on file  Stress: Not on file  Social Connections: Not on file   Lives in a house which is 39 years old. Smoking: denies Occupation: Actuaryfulfillment specialist.  Environmental History: Water Damage/mildew in the house: no Carpet in the family room: yes Carpet in the bedroom: yes Heating:  not sure Cooling: central Pet: no  Family History: Family History  Problem Relation Age of Onset   Coronary artery disease Mother    Hypertension Mother    Hypertension Father    Hypertension Sister    Problem                               Relation Asthma                                   Mother, father  Eczema                                Sister  Food allergy                          No  Allergic rhino conjunctivitis     father, sister  Review of Systems  Constitutional:  Negative for appetite change, chills, fever and unexpected weight change.  HENT:  Negative for congestion and rhinorrhea.   Eyes:  Negative for itching.  Respiratory:  Negative for cough, chest tightness, shortness of breath and wheezing.   Cardiovascular:  Negative for chest pain.  Gastrointestinal:  Negative for abdominal pain.  Genitourinary:  Negative for difficulty urinating.  Skin:  Negative for rash.       itching  Allergic/Immunologic: Positive for environmental allergies. Negative for food allergies.  Neurological:  Negative for headaches.   Objective: BP 120/80 (BP Location: Left Arm, Patient Position: Sitting, Cuff Size: Normal)   Pulse 65   Temp (!) 97.5 F (36.4 C) (Temporal)    Ht 5\' 6"  (1.676 m)   Wt 136 lb 3.2 oz (61.8 kg)   SpO2 96%   BMI 21.98 kg/m  Body mass index is 21.98 kg/m. Physical Exam Vitals and nursing note reviewed.  Constitutional:      Appearance: Normal appearance. She is well-developed.  HENT:     Head: Normocephalic and atraumatic.     Right Ear: External ear normal.     Left Ear: External ear normal.  Nose: Nose normal.     Mouth/Throat:     Mouth: Mucous membranes are moist.     Pharynx: Oropharynx is clear.  Eyes:     Conjunctiva/sclera: Conjunctivae normal.  Cardiovascular:     Rate and Rhythm: Normal rate and regular rhythm.     Heart sounds: Normal heart sounds. No murmur heard.   No friction rub. No gallop.  Pulmonary:     Effort: Pulmonary effort is normal.     Breath sounds: Normal breath sounds. No wheezing, rhonchi or rales.  Abdominal:     Palpations: Abdomen is soft.  Musculoskeletal:     Cervical back: Neck supple.  Skin:    General: Skin is warm.     Findings: No rash.  Neurological:     Mental Status: She is alert and oriented to person, place, and time.  Psychiatric:        Behavior: Behavior normal.  The plan was reviewed with the patient/family, and all questions/concerned were addressed.  It was my pleasure to see Tawsha today and participate in her care. Please feel free to contact me with any questions or concerns.  Sincerely,  Wyline Mood, DO Allergy & Immunology  [facility] office: [phone] [facility] office: [phone]

## 2020-10-12 NOTE — Patient Instructions (Addendum)
Today's skin testing showed: Positive to grass, weed pollen, trees, cat. Negative to common foods. Results given.  Itching: See below for proper skin care. Not sure what's causing your itching.  Re-start zyrtec 10mg  once a day. If no issues after 1 month you may try to take zyrtec every other day and wean off the medication.  Get bloodwork:  We are ordering labs, so please allow 1-2 weeks for the results to come back. With the newly implemented Cures Act, the labs might be visible to you at the same time that they become visible to me. However, I will not address the results until all of the results are back, so please be patient.  In the meantime, continue recommendations in your patient instructions, including avoidance measures (if applicable), until you hear from me.  Environmental allergies Start environmental control measures as below. Use over the counter antihistamines such as Zyrtec (cetirizine), Claritin (loratadine), Allegra (fexofenadine), or Xyzal (levocetirizine) daily as needed. May take twice a day during allergy flares. May switch antihistamines every few months.  Follow up in 4 months or sooner if needed.   Pet Allergen Avoidance: Contrary to popular opinion, there are no "hypoallergenic" breeds of dogs or cats. That is because people are not allergic to an animal's hair, but to an allergen found in the animal's saliva, dander (dead skin flakes) or urine. Pet allergy symptoms typically occur within minutes. For some people, symptoms can build up and become most severe 8 to 12 hours after contact with the animal. People with severe allergies can experience reactions in public places if dander has been transported on the pet owners' clothing. Keeping an animal outdoors is only a partial solution, since homes with pets in the yard still have higher concentrations of animal allergens. Before getting a pet, ask your allergist to determine if you are allergic to animals. If your  pet is already considered part of your family, try to minimize contact and keep the pet out of the bedroom and other rooms where you spend a great deal of time. As with dust mites, vacuum carpets often or replace carpet with a hardwood floor, tile or linoleum. High-efficiency particulate air (HEPA) cleaners can reduce allergen levels over time. While dander and saliva are the source of cat and dog allergens, urine is the source of allergens from rabbits, hamsters, mice and pigs; so ask a non-allergic family member to clean the animal's cage. If you have a pet allergy, talk to your allergist about the potential for allergy immunotherapy (allergy shots). This strategy can often provide long-term relief.  Reducing Pollen Exposure Pollen seasons: trees (spring), grass (summer) and ragweed/weeds (fall). Keep windows closed in your home and car to lower pollen exposure.  Install air conditioning in the bedroom and throughout the house if possible.  Avoid going out in dry windy days - especially early morning. Pollen counts are highest between 5 - 10 AM and on dry, hot and windy days.  Save outside activities for late afternoon or after a heavy rain, when pollen levels are lower.  Avoid mowing of grass if you have grass pollen allergy. Be aware that pollen can also be transported indoors on people and pets.  Dry your clothes in an automatic dryer rather than hanging them outside where they might collect pollen.  Rinse hair and eyes before bedtime.  Skin care recommendations  Bath time: Always use lukewarm water. AVOID very hot or cold water. Keep bathing time to 5-10 minutes. Do NOT use  bubble bath. Use a mild soap and use just enough to wash the dirty areas. Do NOT scrub skin vigorously.  After bathing, pat dry your skin with a towel. Do NOT rub or scrub the skin.  Moisturizers and prescriptions:  ALWAYS apply moisturizers immediately after bathing (within 3 minutes). This helps to  lock-in moisture. Use the moisturizer several times a day over the whole body. Good summer moisturizers include: Aveeno, CeraVe, Cetaphil. Good winter moisturizers include: Aquaphor, Vaseline, Cerave, Cetaphil, Eucerin, Vanicream. When using moisturizers along with medications, the moisturizer should be applied about one hour after applying the medication to prevent diluting effect of the medication or moisturize around where you applied the medications. When not using medications, the moisturizer can be continued twice daily as maintenance.  Laundry and clothing: Avoid laundry products with added color or perfumes. Use unscented hypo-allergenic laundry products such as Tide free, Cheer free & gentle, and All free and clear.  If the skin still seems dry or sensitive, you can try double-rinsing the clothes. Avoid tight or scratchy clothing such as wool. Do not use fabric softeners or dyer sheets.

## 2020-10-13 NOTE — Assessment & Plan Note (Signed)
Pruritus without any rash starting in November 2021.  This occurs on a daily basis if not taking Zyrtec.  Concerned about allergic triggers.  Denies any fevers, chills, changes in medications, diet or personal care products around this time.  Today's skin prick testing showed: Positive to grass, weed pollen, trees, cat. Negative to common foods.  Discussed with patient that the above allergens do not explain her symptoms as she does not have a cat and symptoms started when there are no pollen outdoors.  . Not sure what's causing the itching.   See below for proper skin care. Marland Kitchen Re-start zyrtec 10mg  once a day. o If no issues after 1 month you may try to take zyrtec every other day and wean off the medication.  . Get bloodwork to rule out other etiologies.

## 2020-10-13 NOTE — Assessment & Plan Note (Addendum)
Rhinoconjunctivitis symptoms during the spring and summer and takes over-the-counter antihistamines with good benefit.  No prior allergy testing.  Today skin prick testing was positive for grass, weed, trees and cat.  Patient has sensitive skin, will get bloodwork instead of intradermal testing.  Start environmental control measures as below.  Use over the counter antihistamines such as Zyrtec (cetirizine), Claritin (loratadine), Allegra (fexofenadine), or Xyzal (levocetirizine) daily as needed. May take twice a day during allergy flares. May switch antihistamines every few months.

## 2020-10-15 IMAGING — US US MFM OB DETAIL+14 WK
1 series · 13 of 28 positions shown · non-contrast
Comparison: none

[Series 1: us mfm ob detail+14 wk · 13 of 118 slices shown]
[im 5/118]
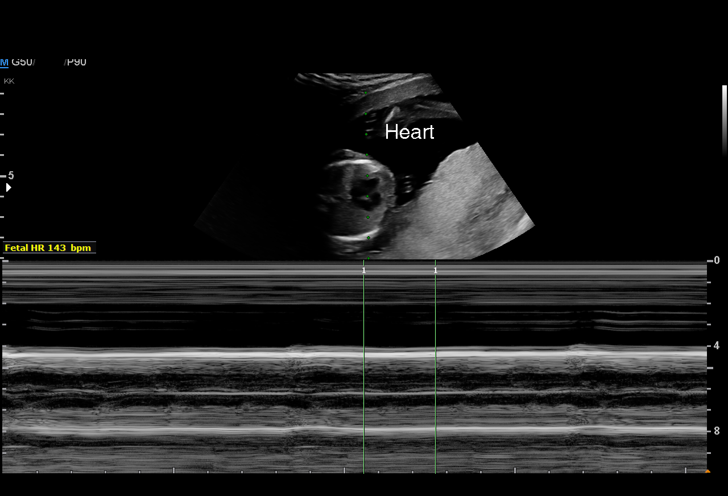
[im 14/118]
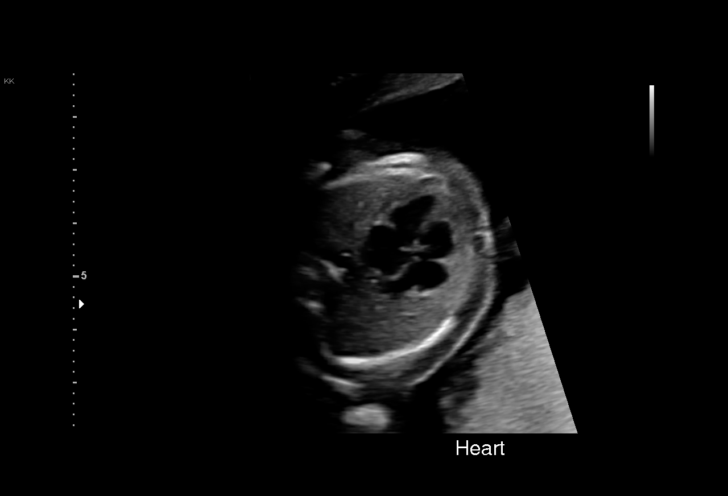
[im 22/118]
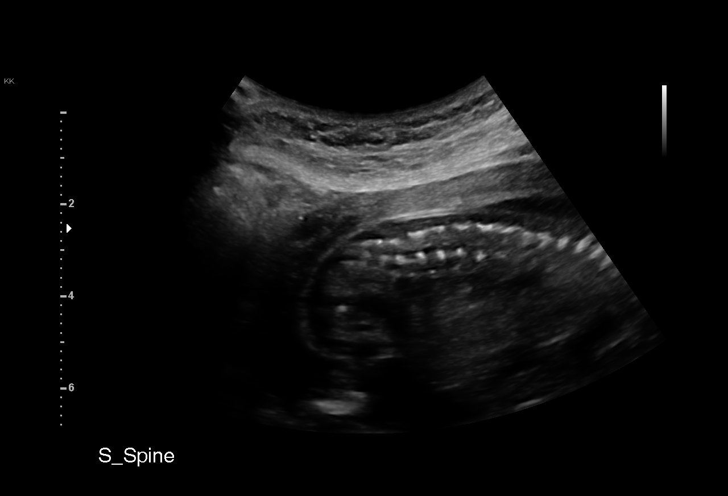
[im 31/118]
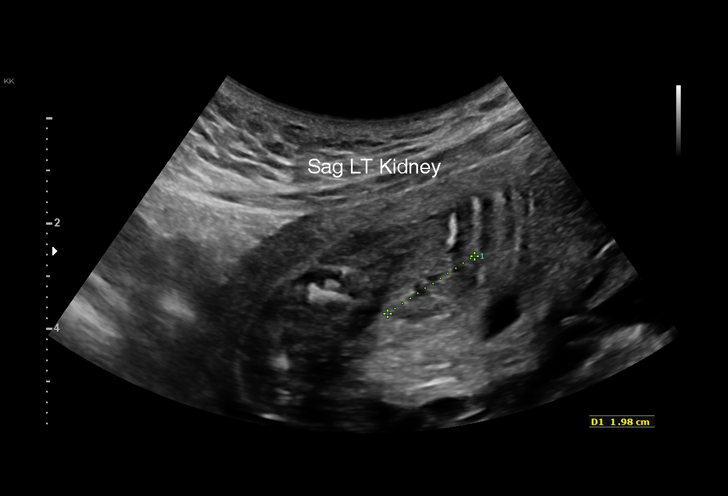
[im 40/118]
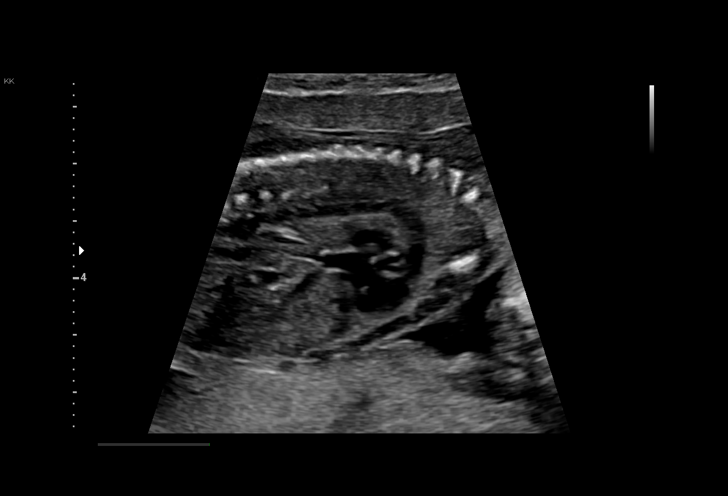
[im 48/118]
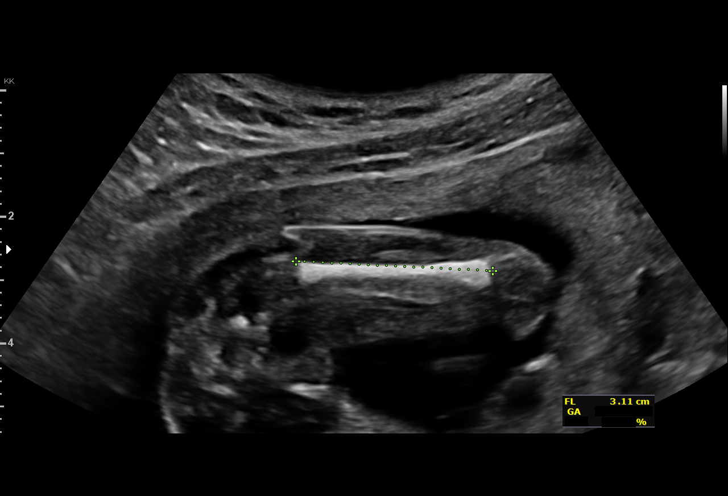
[im 61/118]
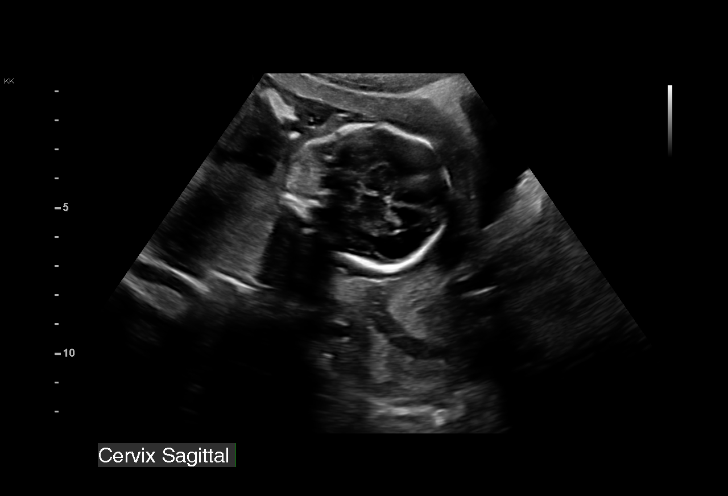
[im 70/118]
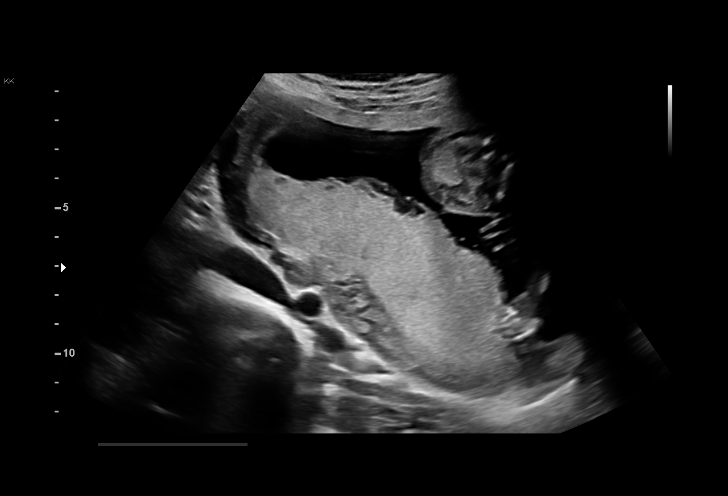
[im 79/118]
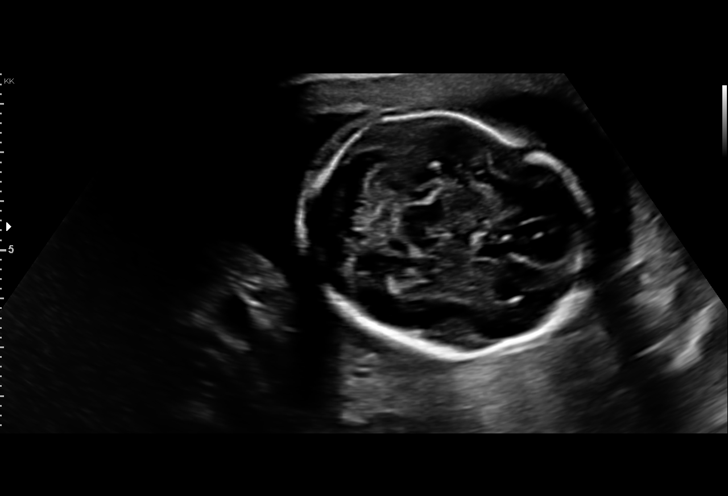
[im 87/118]
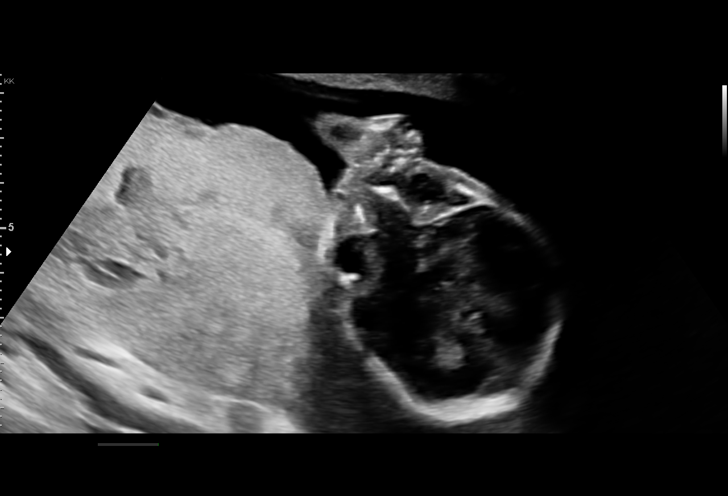
[im 96/118]
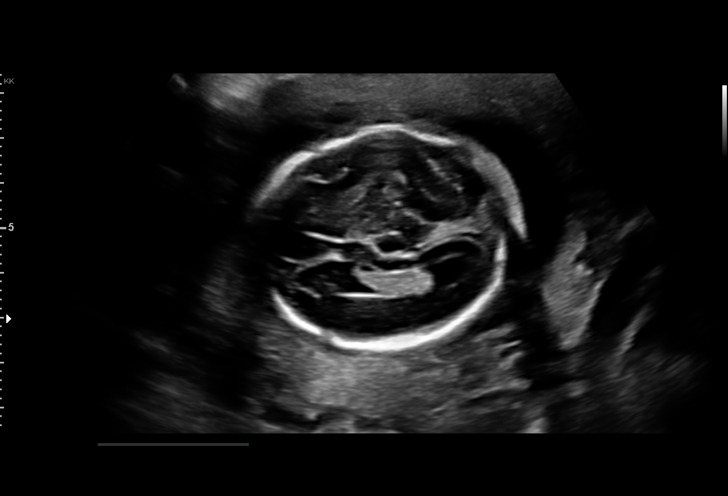
[im 105/118]
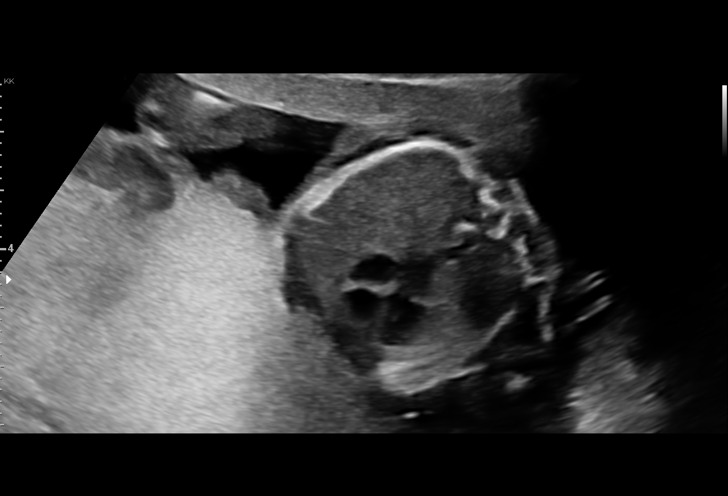
[im 113/118]
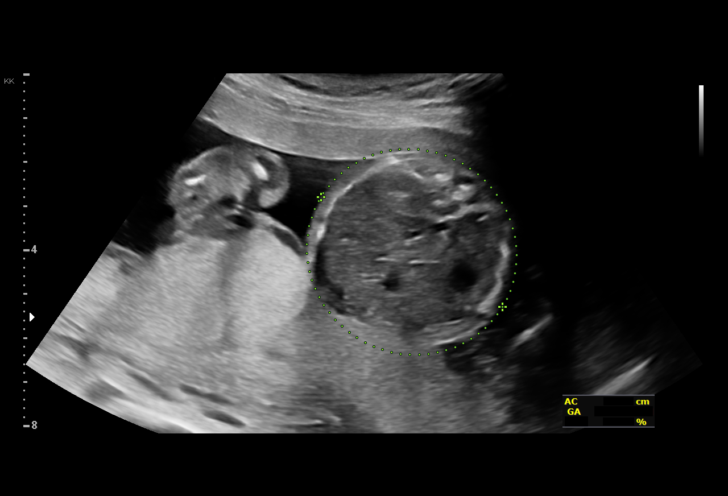

[13 of 28 positions shown; findings below may reference images not displayed]

OB/Gyn Clinic

 ----------------------------------------------------------------------

 ----------------------------------------------------------------------
Indications

  Encounter for antenatal screening for
  malformations
  Advanced maternal age multigravida 35+,
  second trimester( Low Risk NIPS)
  Hypertension - Chronic/Pre-existing
  19 weeks gestation of pregnancy
 ----------------------------------------------------------------------
Vital Signs

 BMI:
Fetal Evaluation

 Num Of Fetuses:         1
 Fetal Heart Rate(bpm):  143
 Cardiac Activity:       Observed
 Presentation:           Cephalic
 Placenta:               Posterior
 P. Cord Insertion:      Not well visualized

 Amniotic Fluid
 AFI FV:      Within normal limits

                             Largest Pocket(cm)

Biometry

 BPD:      46.9  mm     G. Age:  20w 1d         91  %    CI:        70.08   %    70 - 86
                                                         FL/HC:      17.5   %    16.1 -
 HC:      178.7  mm     G. Age:  20w 2d         92  %    HC/AC:      1.20        1.09 -
 AC:      149.5  mm     G. Age:  20w 1d         82  %    FL/BPD:     66.7   %
 FL:       31.3  mm     G. Age:  19w 5d         69  %    FL/AC:      20.9   %    20 - 24
 HUM:      30.1  mm     G. Age:  20w 0d         74  %
 NFT:       5.2  mm

 Est. FW:     327  gm    0 lb 12 oz      60  %
OB History

 Gravidity:    2         Term:   1
 Living:       1
Gestational Age

 LMP:           19w 0d        Date:  10/27/17                 EDD:   08/03/18
 U/S Today:     20w 1d                                        EDD:   07/26/18
 Best:          19w 0d     Det. By:  LMP  (10/27/17)          EDD:   08/03/18
Anatomy

 Cranium:               Appears normal         Aortic Arch:            Appears normal
 Cavum:                 Appears normal         Ductal Arch:            Appears normal
 Ventricles:            Appears normal         Diaphragm:              Appears normal
 Choroid Plexus:        Appears normal         Stomach:                Appears normal, left
                                                                       sided
 Cerebellum:            Appears normal         Abdomen:                Appears normal
 Posterior Fossa:       Appears normal         Abdominal Wall:         Appears nml (cord
                                                                       insert, abd wall)
 Nuchal Fold:           Appears normal         Cord Vessels:           Appears normal (3
                                                                       vessel cord)
 Face:                  Appears normal         Kidneys:                Appear normal
                        (orbits and profile)
 Lips:                  Appears normal         Bladder:                Appears normal
 Thoracic:              Appears normal         Spine:                  Appears normal
 Heart:                 Not well visualized    Upper Extremities:      Appears normal
 RVOT:                  Not well visualized    Lower Extremities:      Appears normal
 LVOT:                  Appears normal

 Other:  Male gender. Heels and 5th digit visualized. Technically difficult due
         to fetal position.
Cervix Uterus Adnexa

 Cervix
 Length:            4.2  cm.
 Normal appearance by transabdominal scan.
Impression

 We performed fetal anatomy scan. No makers of
 aneuploidies or fetal structural defects are seen. Fetal
 biometry is consistent with her previously-established dates.
 Amniotic fluid is normal and good fetal activity is seen.

 On cell-free fetal DNA screening, the risks of fetal
 aneuploidies are not increased.

 Patient has chronic hypertension that is well-controlled with
 labetalol.
Recommendations

 -An appointment was made for her to return in 4 weeks for
 completion of fetal anatomy.
                 Ryugo, Iva Maria

## 2020-10-21 LAB — ALPHA-GAL PANEL
Allergen Lamb IgE: <0.1 kU/L
Beef IgE: <0.1 kU/L
IgE (Immunoglobulin E), Serum: 114 IU/mL (ref 6–495)
O215-IgE Alpha-Gal: <0.1 kU/L
Pork IgE: <0.1 kU/L

## 2020-10-21 LAB — CBC WITH DIFFERENTIAL/PLATELET
Basophils Absolute: 0 10*3/uL (ref 0.0–0.2)
Basos: 1 %
EOS (ABSOLUTE): 0 10*3/uL (ref 0.0–0.4)
Eos: 1 %
Hematocrit: 42.9 % (ref 34.0–46.6)
Hemoglobin: 14.4 g/dL (ref 11.1–15.9)
Immature Grans (Abs): 0 10*3/uL (ref 0.0–0.1)
Immature Granulocytes: 0 %
Lymphocytes Absolute: 1.9 10*3/uL (ref 0.7–3.1)
Lymphs: 50 %
MCH: 33 pg (ref 26.6–33.0)
MCHC: 33.6 g/dL (ref 31.5–35.7)
MCV: 98 fL — ABNORMAL HIGH (ref 79–97)
Monocytes Absolute: 0.4 10*3/uL (ref 0.1–0.9)
Monocytes: 10 %
Neutrophils Absolute: 1.5 10*3/uL (ref 1.4–7.0)
Neutrophils: 38 %
Platelets: 296 10*3/uL (ref 150–450)
RBC: 4.37 x10E6/uL (ref 3.77–5.28)
RDW: 12.5 % (ref 11.7–15.4)
WBC: 3.8 10*3/uL (ref 3.4–10.8)

## 2020-10-21 LAB — COMPREHENSIVE METABOLIC PANEL
ALT: 8 IU/L (ref 0–32)
AST: 10 IU/L (ref 0–40)
Albumin/Globulin Ratio: 1.9 (ref 1.2–2.2)
Albumin: 4.9 g/dL — ABNORMAL HIGH (ref 3.8–4.8)
Alkaline Phosphatase: 85 IU/L (ref 44–121)
BUN/Creatinine Ratio: 14 (ref 9–23)
BUN: 12 mg/dL (ref 6–20)
Bilirubin Total: 0.4 mg/dL (ref 0.0–1.2)
CO2: 25 mmol/L (ref 20–29)
Calcium: 10 mg/dL (ref 8.7–10.2)
Chloride: 102 mmol/L (ref 96–106)
Creatinine, Ser: 0.84 mg/dL (ref 0.57–1.00)
Globulin, Total: 2.6 g/dL (ref 1.5–4.5)
Glucose: 88 mg/dL (ref 65–99)
Potassium: 4.4 mmol/L (ref 3.5–5.2)
Sodium: 140 mmol/L (ref 134–144)
Total Protein: 7.5 g/dL (ref 6.0–8.5)
eGFR: 91 mL/min/{1.73_m2} (ref >59)

## 2020-10-21 LAB — TRYPTASE: Tryptase: 2.5 ug/L (ref 2.2–13.2)

## 2020-10-21 LAB — ALLERGENS W/TOTAL IGE AREA 2
Alternaria Alternata IgE: <0.1 kU/L
Aspergillus Fumigatus IgE: <0.1 kU/L
Bermuda Grass IgE: 1.15 kU/L — AB
Cat Dander IgE: 0.91 kU/L — AB
Cedar, Mountain IgE: <0.1 kU/L
Cladosporium Herbarum IgE: <0.1 kU/L
Cockroach, German IgE: <0.1 kU/L
Common Silver Birch IgE: <0.1 kU/L
Cottonwood IgE: <0.1 kU/L
D Farinae IgE: <0.1 kU/L
D Pteronyssinus IgE: <0.1 kU/L
Dog Dander IgE: 0.35 kU/L — AB
Elm, American IgE: <0.1 kU/L
Johnson Grass IgE: 1.51 kU/L — AB
Maple/Box Elder IgE: <0.1 kU/L
Mouse Urine IgE: <0.1 kU/L
Oak, White IgE: <0.1 kU/L
Pecan, Hickory IgE: <0.1 kU/L
Penicillium Chrysogen IgE: <0.1 kU/L
Pigweed, Rough IgE: <0.1 kU/L
Ragweed, Short IgE: <0.1 kU/L
Sheep Sorrel IgE Qn: <0.1 kU/L
Timothy Grass IgE: 7.17 kU/L — AB
White Mulberry IgE: <0.1 kU/L

## 2020-10-21 LAB — ENA+DNA/DS+SJORGEN'S
ENA RNP Ab: <0.2 AI (ref 0.0–0.9)
ENA SM Ab Ser-aCnc: <0.2 AI (ref 0.0–0.9)
ENA SSA (RO) Ab: <0.2 AI (ref 0.0–0.9)
ENA SSB (LA) Ab: 0.3 AI (ref 0.0–0.9)
dsDNA Ab: 28 IU/mL — ABNORMAL HIGH (ref 0–9)

## 2020-10-21 LAB — ANA W/REFLEX: Anti Nuclear Antibody (ANA): POSITIVE — AB

## 2020-10-21 LAB — SEDIMENTATION RATE: Sed Rate: 4 mm/hr (ref 0–32)

## 2020-10-21 LAB — THYROID CASCADE PROFILE: TSH: 1.26 u[IU]/mL (ref 0.450–4.500)

## 2020-10-21 LAB — CHRONIC URTICARIA: cu index: 5.8 (ref <10)

## 2020-10-22 ENCOUNTER — Telehealth: Payer: Self-pay

## 2020-10-22 NOTE — Telephone Encounter (Signed)
Per Dr. Selena Batten please place referral to rheumatology for positive ANA and dsDNA ab 28. Please evaluate for lupus. Thank you

## 2020-10-26 NOTE — Telephone Encounter (Signed)
I called and left a message letting the patient know her referral to [facility] has been placed for review.  Their office will review the patients referral and contact her.  [facility] [address] [place],  [place]  [address] Main: [phone]

## 2020-11-25 NOTE — Progress Notes (Signed)
Office Visit Note  Patient: Paige Neal             Date of Birth: 07-May-1980           MRN: [id number]             PCP: Shary Key, DO Referring: Garnet Sierras, DO Visit Date: 11/26/2020  Subjective:  New Patient (Initial Visit) (Abnormal labs)   History of Present Illness: Paige Neal is a 40 y.o. female here for positive ANA and dsDNA with skin itching and allergies. Symptoms were gradual onset but most of the past 1 year. She experiences generalized areas of itching and some skin changes associated with the excoriations. She does also have allergic rhinitis symptoms. Oral antihistamines recommended by PCP and improved her symptoms almost completely. She was referred to allergy clinic due to recurrence of her symptoms with any attempt to decrease medication.  Lab test were positive for ANA and double-stranded DNA antibodies otherwise unremarkable.  She has experienced some joint pain not associated with significant redness swelling or decreased range of motion.  She has noticed some thinning of hair in the frontal and temporal areas not sure whether this is related to her most recent pregnancy as she reported similar standing after her first child was born.  She denies oral ulcers lymphadenopathy photosensitivity or any history of blood clots.  She reports occasional purplish discoloration of the tips of her fingers with cold exposure never any blistering ulceration or pitting.  Labs reviewed 09/2020 ANA pos dsDNA 28 RNP, SM, SSA, SSB neg ESR 4 TSH 1.260 CBC wnl CMP wnl  Activities of Daily Living:  Patient reports morning stiffness for 0  none .   Patient Denies nocturnal pain.  Difficulty dressing/grooming: Denies Difficulty climbing stairs: Denies Difficulty getting out of chair: Denies Difficulty using hands for taps, buttons, cutlery, and/or writing: Denies  Review of Systems  Constitutional:  Negative for fatigue and night sweats.  HENT:  Negative for  mouth dryness.   Eyes:  Negative for dryness.  Respiratory:  Negative for shortness of breath.   Cardiovascular:  Negative for swelling in legs/feet.  Gastrointestinal:  Negative for constipation.  Endocrine: Negative for excessive thirst.  Genitourinary:  Negative for difficulty urinating.  Musculoskeletal:  Positive for joint pain and joint pain.  Skin:  Negative for rash.  Allergic/Immunologic: Negative for susceptible to infections.  Neurological:  Negative for night sweats.  Hematological:  Negative for bruising/bleeding tendency.  Psychiatric/Behavioral:  Negative for sleep disturbance.    PMFS History:  Patient Active Problem List   Diagnosis Date Noted   Positive ANA (antinuclear antibody) 11/26/2020   Vitamin D deficiency 11/26/2020   Annual physical exam 07/20/2020   Hyperemesis gravidarum    Panic attack    Nausea 01/02/2020   Pruritus 01/02/2020   Stress 01/02/2020   Encounter for hepatitis C screening test for low risk patient 01/02/2020   Family history of diabetes mellitus 11/14/2019   Essential hypertension 12/01/2017   Breast mass 06/27/2011   Other allergic rhinitis 06/01/2009    Past Medical History:  Diagnosis Date   Abnormal uterine bleeding (AUB) 10/03/2019   Blister of foot without infection, left, sequela 11/14/2019   Breast mass 06/27/2011   EXAM: 08/2013 DIGITAL DIAGNOSTIC BILATERAL MAMMOGRAM WITH 3D TOMOSYNTHESIS WITH CAD ULTRASOUND LEFT BREAST COMPARISON: 07/05/2011. 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 RECOMMENDATION: Left breast ultrasound in 6 months. The options of ultrasound-guided core needle biopsy and surgical excision were also discussed with  the patient but not recommended at this time. 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   Nausea 01/02/2020   Panic attack    Pruritus 01/02/2020   Stress 01/02/2020   Tachycardia    Vaginal lump 09/16/2018   Vaginal lump 09/16/2018    Family History  Problem Relation Age of Onset   Coronary artery disease Mother    Hypertension Mother    Hypertension Father    Hypertension Sister    Multiple sclerosis Sister    Past Surgical History:  Procedure Laterality Date   BREAST EXCISIONAL BIOPSY Bilateral    BREAST FIBROADENOMA SURGERY     gardisil x 3 - 08/30/2005     Social History   Social History Narrative   Pt lives in [place] and works at [employer],  single   Immunization History  Administered Date(s) Administered   Influenza Split 11/25/2010, 03/05/2012   Influenza Whole 01/02/2009, 12/03/2009   Influenza,inj,Quad PF,6+ Mos 01/01/2013, 11/25/2013, 11/21/2014, 12/08/2015, 11/27/2017, 11/13/2018, 11/14/2019   PFIZER Comirnaty(Gray Top)Covid-19 Tri-Sucrose Vaccine 04/17/2020   PFIZER(Purple Top)SARS-COV-2 Vaccination 09/07/2019, 09/28/2019   Tdap 06/24/2011, 05/03/2018     Objective: Vital Signs: BP 136/85 (BP Location: Right Arm, Patient Position: Sitting, Cuff Size: Normal)   Pulse 80   Resp 14   Ht _0  (1.676 m)   Wt 136 lb (61.7 kg)   BMI 21.95 kg/m    Physical Exam HENT:     Right Ear: External ear normal.     Left Ear: External ear normal.     Mouth/Throat:     Mouth: Mucous membranes are moist.     Pharynx: Oropharynx is clear.  Eyes:     Conjunctiva/sclera: Conjunctivae normal.  Cardiovascular:     Rate and Rhythm: Normal rate and regular rhythm.  Pulmonary:     Effort: Pulmonary effort is normal.     Breath sounds: Normal breath sounds.  Skin:    General: Skin is warm and dry.     Findings: No rash.     Comments: Small ingrown hair or superficial mole on left base of the neck Faint hyperpigmentation in linear pattern on forearms  Neurological:     Mental Status: She is  alert.     Deep Tendon Reflexes: Reflexes normal.  Psychiatric:        Mood and Affect: Mood normal.     Musculoskeletal Exam:  Neck full ROM no tenderness Shoulders full ROM no tenderness or swelling Elbows full ROM no tenderness or swelling Wrists full ROM no tenderness or swelling Fingers full ROM no tenderness or swelling Knees full ROM no tenderness or swelling Ankles full ROM no tenderness or swelling   Investigation: No additional findings.  Imaging: No results found.  Recent Labs: Lab Results  Component Value Date   WBC 3.8 10/12/2020   HGB 14.4 10/12/2020   PLT 296 10/12/2020   NA 140 10/12/2020   K 4.4 10/12/2020   CL 102 10/12/2020   CO2 25 10/12/2020   GLUCOSE 88 10/12/2020   BUN 12 10/12/2020   CREATININE 0.84 10/12/2020   BILITOT 0.4 10/12/2020   ALKPHOS 85 10/12/2020   AST 10 10/12/2020   ALT 8 10/12/2020   PROT 7.5 10/12/2020  ALBUMIN 4.9 (H) 10/12/2020   CALCIUM 10.0 10/12/2020   GFRAA 123 01/01/2020    Speciality Comments: No specialty comments available.  Procedures:  No procedures performed Allergies: Patient has no known allergies.   Assessment / Plan:     Visit Diagnoses: Positive ANA (antinuclear antibody) - Plan: ANA, C3 and C4  Positive ANA and positive double-stranded DNA but no clinical criteria for lupus or other systemic connective tissue disease appreciable.  We will recheck ANA for immunofluorescence pattern and titer also complements.  Lower pretest suspicion so if very low would not recommend routine follow-up at this time but can reassess for change in symptoms, if very abnormal observation at this time.  Pruritus  Sounds most consistent with allergic dermatitis or other eczema problem with minimal visible skin changes.  Question whether linear hyperpigmentation's are due to previous excoriations.  No active inflammatory changes appreciated on exam today.  Vitamin D deficiency - Plan: VITAMIN D 25 Hydroxy (Vit-D  Deficiency, Fractures)  Discussed role of vitamin D for immune system function to be increased with skin inflammation activity.  We will check serum levels today and recommend replacement to >30.  Orders: Orders Placed This Encounter  Procedures   ANA   C3 and C4   VITAMIN D 25 Hydroxy (Vit-D Deficiency, Fractures)    No orders of the defined types were placed in this encounter.    Follow-Up Instructions: No follow-ups on file.   Collier Salina, MD  Note - This record has been created using [employer].  Chart creation errors have been sought, but may not always  have been located. Such creation errors do not reflect on  the standard of medical care.

## 2020-11-26 ENCOUNTER — Other Ambulatory Visit: Payer: Self-pay

## 2020-11-26 ENCOUNTER — Ambulatory Visit: Payer: BC Managed Care – PPO | Admitting: Internal Medicine

## 2020-11-26 ENCOUNTER — Encounter: Payer: Self-pay | Admitting: Internal Medicine

## 2020-11-26 VITALS — BP 136/85 | HR 80 | Resp 14 | Ht 66.0 in | Wt 136.0 lb

## 2020-11-26 DIAGNOSIS — E559 Vitamin D deficiency, unspecified: Secondary | ICD-10-CM

## 2020-11-26 DIAGNOSIS — L299 Pruritus, unspecified: Secondary | ICD-10-CM

## 2020-11-26 DIAGNOSIS — R768 Other specified abnormal immunological findings in serum: Secondary | ICD-10-CM

## 2020-11-26 DIAGNOSIS — R7689 Other specified abnormal immunological findings in serum: Secondary | ICD-10-CM

## 2020-11-26 HISTORY — DX: Vitamin D deficiency, unspecified: E55.9

## 2020-11-26 HISTORY — DX: Other specified abnormal immunological findings in serum: R76.8

## 2020-11-26 NOTE — Progress Notes (Deleted)
Office Visit Note  Patient: Paige Neal             Date of Birth: 1980-03-14           MRN: [id number]             PCP: Cora Collum, DO Visit Date: 11/26/2020   Assessment:        Visit Diagnoses: No diagnosis found.   Follow-Up Instructions: No follow-ups on file.  Orders: No orders of the defined types were placed in this encounter.  No orders of the defined types were placed in this encounter.    Subjective:    Allergies: Patient has no known allergies.   Activities of Daily Living: ***   History of Present Illness: Paige Neal is a 40 y.o. female ***   Review of Systems  Constitutional:  Positive for fatigue.  HENT:  Negative for mouth dryness.   Eyes:  Negative for dryness.  Respiratory:  Negative for shortness of breath.   Cardiovascular:  Negative for swelling in legs/feet.  Gastrointestinal:  Negative for constipation.  Endocrine: Negative for excessive thirst.  Genitourinary:  Negative for difficulty urinating.  Musculoskeletal:  Positive for joint pain and joint pain.  Skin:  Negative for rash.  Allergic/Immunologic: Negative for susceptible to infections.  Neurological:  Negative for numbness.  Hematological:  Negative for bruising/bleeding tendency.  Psychiatric/Behavioral:  Negative for sleep disturbance.     Investigation: No additional findings.   Objective: Vital Signs: BP 136/85 (BP Location: Right Arm, Patient Position: Sitting, Cuff Size: Normal)   Pulse 80   Resp 14   Ht 5\' 6"  (1.676 m)   Wt 136 lb (61.7 kg)   BMI 21.95 kg/m    Physical Exam   Musculoskeletal Exam: ***  CDAI Exam: CDAI Score: -- Patient Global: --; Provider Global: -- Swollen: --; Tender: -- Joint Exam 11/26/2020   No joint exam has been documented for this visit   There is currently no information documented on the homunculus. Go to the Rheumatology activity and complete the homunculus joint exam.  Speciality Comments: No specialty  comments available.  Imaging: No results found.   PMFS History:  Patient Active Problem List   Diagnosis Date Noted   Annual physical exam 07/20/2020   Hyperemesis gravidarum    Panic attack    Nausea 01/02/2020   Pruritus 01/02/2020   Stress 01/02/2020   Encounter for hepatitis C screening test for low risk patient 01/02/2020   Family history of diabetes mellitus 11/14/2019   Essential hypertension 12/01/2017   Breast mass 06/27/2011   Other allergic rhinitis 06/01/2009    Past Medical History:  Diagnosis Date   Abnormal uterine bleeding (AUB) 10/03/2019   Blister of foot without infection, left, sequela 11/14/2019   Breast mass 06/27/2011   EXAM: 08/2013 DIGITAL DIAGNOSTIC BILATERAL MAMMOGRAM WITH 3D TOMOSYNTHESIS WITH CAD ULTRASOUND LEFT BREAST COMPARISON: 07/05/2011. 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 RECOMMENDATION: Left breast ultrasound in 6 months. The options of ultrasound-guided core needle biopsy and surgical excision were also discussed with the patient but not recommended at this time. 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   Nausea 01/02/2020   Panic attack    Pruritus 01/02/2020  Stress 01/02/2020   Tachycardia    Vaginal lump 09/16/2018   Vaginal lump 09/16/2018    Family History  Problem Relation Age of Onset   Coronary artery disease Mother    Hypertension Mother    Hypertension Father    Hypertension Sister    Multiple sclerosis Sister    Past Surgical History:  Procedure Laterality Date   BREAST EXCISIONAL BIOPSY Bilateral    BREAST FIBROADENOMA SURGERY     gardisil x 3 - 08/30/2005     Social History   Social History Narrative   Pt lives in [place] and works at  [employer],  single     Procedures:  No procedures performed  [employer], RT  Note - This record has been created using [employer]. Chart creation errors have been sought, but may not always have been located. Such creation errors do not reflect on the standard of medical care.

## 2020-11-26 NOTE — Patient Instructions (Addendum)
Asd Anti-DNA Antibody Test Why am I having this test? The anti-DNA antibody test helps with the diagnosis and follow-up of systemic lupus erythematosus (SLE). It is also used to monitor treatment of this condition as the antibody decreases with successful therapy. What is being tested? This test measures the amount of anti-DNA antibody in the blood. This antibody is found in 65-80% of patients with active SLE. This antibody is not as common in patients who have other diseases. What kind of sample is taken? A blood sample is required for this test. It is usually collected by inserting a needle into a blood vessel. How are the results reported? Your test results will be reported as a value. Your test results may also be reported as positive, intermediate, or negative. Your health care provider will compare your results to normal ranges that were established after testing a large group of people (reference values). Reference values may vary among labs and hospitals. For this test, common reference values are: Positive: 10 or more international units/mL. Intermediate: 5-9 international units/mL. Negative: Less than 5 international units/mL. What do the results mean? Positive results, which are associated with results that are higher than the reference values, may indicate: Autoimmune disorders such as SLE. Infectious mononucleosis. Chronic liver conditions. Intermediate results mean that the anti-DNA antibody levels are higher than normal, but not high enough to be considered positive. Negative results mean that you do not have the anti-DNA antibody that is associated with these conditions. Talk with your health care provider about what your results mean. Questions to ask your health care provider Ask your health care provider, or the department that is doing the test: When will my results be ready? How will I get my results? What are my treatment options? What other tests do I need? What are  my next steps? Summary The anti-DNA antibody test helps with the diagnosis and follow-up of systemic lupus erythematosus (SLE). It is also used to monitor treatment of this condition as the antibody decreases with successful therapy. This test measures the amount of anti-DNA antibody in the blood. Elevated levels of anti-DNA antibody can be seen in patients with SLE and certain other conditions.  Complement Assay Test Why am I having this test? Complement refers to a group of proteins that are part of the body's disease-fighting system (immune system). A complement assay test provides information about some or all of these proteins. You may have this test: To diagnose a lack, or deficiency, of certain complement proteins. Deficiencies can be passed from parent to child (inherited). To monitor an infection or autoimmune disease. If you have unexplained inflammation or swelling (edema). If you have bacterial infections again and again. What is being tested? This test can be used to measure: Total complement. This is the total number of protein complements in your blood. The number of each kind of complement in your blood. The nine main kinds of complement are labeled C1 through C9. Some of these complements, such as C3 and C4, are especially important and have many functions in the body. Depending on why you are having the test, your health care provider may test your total complement or only some individual complements, such as C3 and C4. The total complement assay test may be done before individual complements are tested. What kind of sample is taken? A blood sample is required for this test. It is usually collected by inserting a needle into a blood vessel. Tell a health care provider about: Any allergies you  have. All medicines you are taking, including vitamins, herbs, eye drops, creams, and over-the-counter medicines. Any blood disorders you have. Any surgeries you have had. Any medical  conditions you have. Whether you are pregnant or may be pregnant. How are the results reported? Your results will be reported as a value that tells you how much complement is in your blood. This will be given as units per milliliter of blood (units/mL) or as milligrams per deciliter of blood (mg/dL). Your results may be reported as total complement, or as individual complements, or both. Your health care provider will compare your results to normal ranges that were established after testing a large group of people (reference ranges). Reference ranges may vary among labs and hospitals. For this test, reference ranges for some of the most commonly measured complement assays may be: Total complement: 30-75 units/mL. C2: 1-4 mg/dL. C3: 75-175 mg/dL. C4: 22-45 units/mL. What do the results mean? Results within reference ranges are considered normal, which means you have a normal amount of complement in your blood. Results that are higher than the reference ranges may be caused by: Inflammatory disease. Heart attack. Cancer. Complement deficiencies, or results lower than the reference ranges, may be caused by: Certain inherited conditions. Autoimmune disease. Certain liver diseases. Malnutrition. Certain types of anemia that result in breakdown of red blood cells (hemolytic anemia). Talk with your health care provider about what your results mean. Questions to ask your health care provider Ask your health care provider, or the department that is doing the test: When will my results be ready? How will I get my results? What are my treatment options? What other tests do I need? What are my next steps? Summary Complement refers to a group of proteins that are part of the body's disease-fighting system (immune system). A complement assay test can provide information about some or all of these proteins. You may have a complement assay test to help diagnose a complement deficiency, and to monitor  some infections or autoimmune disease. Talk with your health care provider about what your results mean.  Vitamin D Test Why am I having this test? Vitamin D is a vitamin that your body makes when you are exposed to sunlight. It is also found naturally in fish and eggs, and it is added to some foods, such as cereals and dairy products. You need vitamin D to maintain bone health and to support your disease-fighting (immune) system. You may have a vitamin D test: To help diagnose osteoporosis or other bone disorders. To monitor treatment of osteoporosis or other bone disorders. If you have symptoms of a lack (deficiency) of vitamin D, such as experiencing broken bones from minor injuries. If you lack certain nutrients in your diet (dietary deficiencies). If you have problems absorbing nutrients during digestion. What is being tested? There are two types of vitamin D tests that measure slightly different things: The 25-hydroxyvitamin D test measures how much of a substance called 25-hydroxyvitamin D is in your blood. The body converts vitamin D to 25-hydroxyvitamin D in the liver. Measuring how much 25-hydroxyvitamin D is in your blood provides a good idea of your vitamin D levels. This is the most common type of vitamin D test. The 1,25-dihydroxyvitamin D test measures how much of a substance called 1,25-dihydroxyvitamin D is in your blood. The body converts vitamin D into this substance and then uses it for various functions. This test provides an idea of your total vitamin D levels. What kind of sample is  taken? A blood sample is required for this test. It is usually collected by inserting a needle into a blood vessel or by sticking a finger with a small needle. Tell a health care provider about: Any allergies you have. All medicines you are taking, including vitamins, herbs, eye drops, creams, and over-the-counter medicines. Any blood disorders you have. Any surgeries you have had. Any  medical conditions you have. Whether you are pregnant or may be pregnant. How are the results reported? Your test results will be reported as a value that tells you how much vitamin D is in your blood. Your health care provider will compare your results to normal ranges that were established after testing a large group of people (reference ranges). Reference ranges may vary among labs and hospitals. For vitamin D tests, common reference ranges are: 25-hydroxyvitamin D: 25-80 ng/mL. 1,25-dihydroxyvitamin D: Males: 18-64 pg/mL. Females: 18-78 pg/mL. What do the results mean? If your result is within your reference range, this means that you have a normal amount of vitamin D in your blood. If your result is lower than your reference range, this means that you have too little vitamin D in your body. If you had the 25-hydroxyvitamin D test specifically: A result of 21-29 ng/mL means that you have slightly lower levels of vitamin D than normal (vitamin D insufficiency). A result of 20 ng/mL or less means that you have very low levels of vitamin D (vitamin D deficiency). Low levels of vitamin D can indicate: Not having enough exposure to sunlight. Not eating enough foods that contain vitamin D. Osteoporosis. Kidney disease. Liver disease. A bone disease that makes the bones weak, soft, or poorly developed (rickets). Softening of the bones (osteomalacia). The body not absorbing vitamin D normally (gastrointestinal malabsorption). If your result is higher than your reference range, this means that you have too much vitamin D in your body. This can result from: Taking too many dietary supplements. Hyperparathyroidism. This is a rare condition in which you do not make enough of a certain type of hormone (parathyroid hormone or PTH). A disease that causes inflammation in your organs and other areas of your body (sarcoidosis). A rare developmental disorder that is present at birth [place] Knife  syndrome). Talk with your health care provider about what your results mean. Questions to ask your health care provider Ask your health care provider, or the department that is doing the test: When will my results be ready? How will I get my results? What are my treatment options? What other tests do I need? What are my next steps? Summary You may have a vitamin D test to determine if your body has enough vitamin D. You need vitamin D to maintain bone health and to support your disease-fighting (immune) system. Your vitamin D level is determined with a blood test. Talk with your health care provider about what your results mean. This information is not intended to replace advice given to you by your health care provider. Make sure you discuss any questions you have with your health care provider. Document Revised: 11/28/2019 Document Reviewed: 11/28/2019 Elsevier Patient Education  2022 [employer].

## 2020-11-27 LAB — VITAMIN D 25 HYDROXY (VIT D DEFICIENCY, FRACTURES): Vit D, 25-Hydroxy: 43 ng/mL (ref 30–100)

## 2020-11-27 LAB — C3 AND C4
C3 Complement: 123 mg/dL (ref 83–193)
C4 Complement: 33 mg/dL (ref 15–57)

## 2020-11-27 LAB — ANA: Anti Nuclear Antibody (ANA): NEGATIVE

## 2020-11-30 NOTE — Progress Notes (Signed)
Spoke with Paige Neal her lab results are all negative. The positive ANA from a month before is now negative when repeating for IFA pattern. This is not likely to represent an underlying systemic connective tissue disease along with a benign exam. I recommended she does not need more testing or follow up with [place] at this time.

## 2021-01-01 ENCOUNTER — Other Ambulatory Visit: Payer: Self-pay

## 2021-01-01 ENCOUNTER — Ambulatory Visit: Payer: BC Managed Care – PPO | Admitting: Cardiology

## 2021-01-01 ENCOUNTER — Encounter: Payer: Self-pay | Admitting: Cardiology

## 2021-01-01 VITALS — BP 134/76 | HR 81 | Ht 66.0 in | Wt 135.1 lb

## 2021-01-01 DIAGNOSIS — I10 Essential (primary) hypertension: Secondary | ICD-10-CM | POA: Diagnosis not present

## 2021-01-01 NOTE — Progress Notes (Signed)
Cardiology Office Note:    Date:  01/01/2021   ID:  Paige Neal, DOB Mar 13, 1980, MRN [id number]  PCP:  Cora Collum, DO  Cardiologist:  Garwin Brothers, MD   Referring MD: Cora Collum, DO    ASSESSMENT:    1. Essential hypertension    PLAN:    In order of problems listed above:  Primary prevention stressed with the patient.  Importance of compliance with diet medication stressed and she vocalized understanding. Essential hypertension: Blood pressure stable and diet was emphasized.  Lifestyle modification urged.  Salt intake issues etc. were discussed.  I told her to walk at least half an hour a day 5 days a week and she promises to do so. Patient will be seen in follow-up appointment in 9 months or earlier if the patient has any concerns    Medication Adjustments/Labs and Tests Ordered: Current medicines are reviewed at length with the patient today.  Concerns regarding medicines are outlined above.  Orders Placed This Encounter  Procedures   EKG 12-Lead   No orders of the defined types were placed in this encounter.    No chief complaint on file.    History of Present Illness:    Paige Neal is a 40 y.o. female.  Patient has past medical history of essential hypertension.  She denies any problems at this time and takes care of activities of daily living.  No chest pain orthopnea or PND.  At the time of my evaluation, the patient is alert awake oriented and in no distress.  She has been under stress because she is trying to get her house.  And things are not working to her satisfaction.  Overall she thinks that she is at the end of that line and is getting her home closed very soon.  Past Medical History:  Diagnosis Date   Annual physical exam 07/20/2020   Breast mass 06/27/2011   EXAM: 08/2013 DIGITAL DIAGNOSTIC BILATERAL MAMMOGRAM WITH 3D TOMOSYNTHESIS WITH CAD ULTRASOUND LEFT BREAST COMPARISON: 07/05/2011. 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 RECOMMENDATION: Left breast ultrasound in 6 months. The options of ultrasound-guided core needle biopsy and surgical excision were also discussed with the patient but not recommended at this time. She is currently co   Encounter for hepatitis C screening test for low risk patient 01/02/2020   Essential hypertension 12/01/2017   Family history of diabetes mellitus 11/14/2019   Hyperemesis gravidarum    Nausea 01/02/2020   Other allergic rhinitis 06/01/2009   Qualifier: Diagnosis of  By: Sandi Mealy  MD, Judeth Cornfield     Panic attack    Positive ANA (antinuclear antibody) 11/26/2020   Pruritus 01/02/2020   Stress 01/02/2020   Vitamin D deficiency 11/26/2020    Past Surgical History:  Procedure Laterality Date   BREAST EXCISIONAL BIOPSY Bilateral    BREAST FIBROADENOMA SURGERY     gardisil x 3 - 08/30/2005      Current Medications: Current Meds  Medication Sig   amLODipine (NORVASC) 5 MG tablet TAKE 1 TABLET (5 MG TOTAL) BY MOUTH DAILY.   cetirizine (ZYRTEC) 10 MG tablet Take 1 tablet (10 mg total) by mouth daily.   ferrous sulfate 325 (65 FE) MG tablet TAKE 1 TABLET BY MOUTH EVERY DAY WITH BREAKFAST   norethindrone (MICRONOR) 0.35 MG tablet Take 1 tablet (0.35 mg total) by mouth daily.   prenatal vitamin w/FE, FA (PRENATAL 1 + 1) 27-1 MG TABS tablet Take 2 tablets by mouth daily at 12 noon.  Allergies:   Patient has no known allergies.   Social History   Socioeconomic History   Marital status: Married    Spouse name: Not on file   Number of children: Not on file   Years of education: Not on file   Highest education level: Not on file  Occupational History   Not on file  Tobacco Use   Smoking status: Never   Smokeless tobacco: Never  Vaping Use   Vaping Use: Never used  Substance and Sexual Activity   Alcohol use: No   Drug use: No   Sexual activity: Yes    Birth control/protection: None  Other Topics Concern   Not on file  Social History Narrative   Pt lives in  [place] and works at [employer],  single   Social Determinants of Corporate investment banker Strain: Not on file  Food Insecurity: Not on file  Transportation Needs: Not on file  Physical Activity: Not on file  Stress: Not on file  Social Connections: Not on file     Family History: The patient's family history includes Coronary artery disease in her mother; Hypertension in her father, mother, and sister; Multiple sclerosis in her sister.  ROS:   Please see the history of present illness.    All other systems reviewed and are negative.  EKGs/Labs/Other Studies Reviewed:    The following studies were reviewed today: EKG with sinus rhythm and nonspecific ST-T changes   Recent Labs: 10/12/2020: ALT 8; BUN 12; Creatinine, Ser 0.84; Hemoglobin 14.4; Platelets 296; Potassium 4.4; Sodium 140; TSH 1.260  Recent Lipid Panel    Component Value Date/Time   CHOL 130 09/05/2017 0000   TRIG 58 09/05/2017 0000   HDL 54 09/05/2017 0000   CHOLHDL 2.4 09/05/2017 0000   CHOLHDL 2.9 Ratio 05/16/2007 1804   VLDL 18 05/16/2007 1804   LDLCALC 64 09/05/2017 0000    Physical Exam:    VS:  BP 134/76   Pulse 81   Ht 5\' 6"  (1.676 m)   Wt 135 lb 1.3 oz (61.3 kg)   SpO2 96%   BMI 21.80 kg/m     Wt Readings from Last 3 Encounters:  01/01/21 135 lb 1.3 oz (61.3 kg)  11/26/20 136 lb (61.7 kg)  10/12/20 136 lb 3.2 oz (61.8 kg)     GEN: Patient is in no acute distress HEENT: Normal NECK: No JVD; No carotid bruits LYMPHATICS: No lymphadenopathy CARDIAC: Hear sounds regular, 2/6 systolic murmur at the apex. RESPIRATORY:  Clear to auscultation without rales, wheezing or rhonchi  ABDOMEN: Soft, non-tender, non-distended MUSCULOSKELETAL:  No edema; No deformity  SKIN: Warm and dry NEUROLOGIC:  Alert and oriented x 3 PSYCHIATRIC:  Normal affect   Signed, 10/14/20, MD  01/01/2021 1:42 PM    [facility]

## 2021-01-01 NOTE — Patient Instructions (Signed)

## 2021-01-07 IMAGING — US US MFM OB FOLLOW UP
1 series · 13 of 28 positions shown · non-contrast
Comparison: none

[Series 1: us mfm ob follow up · 46 acquisitions, 13 frames shown]
[im 2/46]
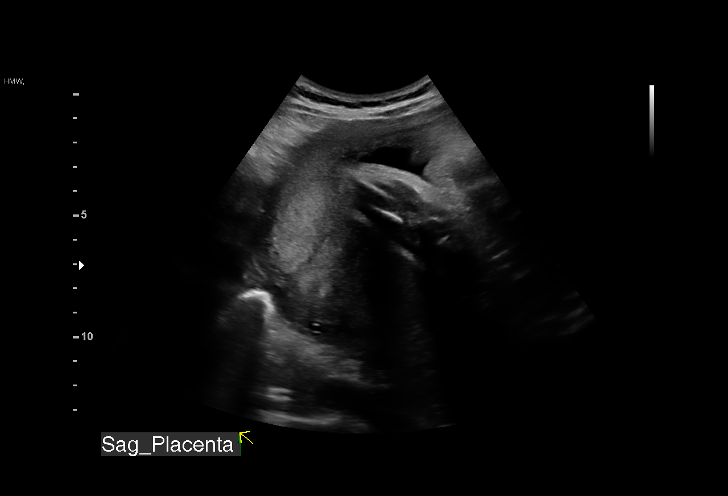
[im 6/46]
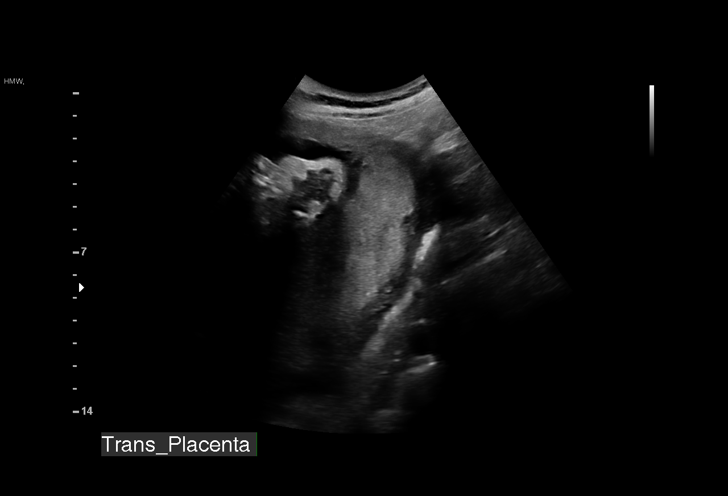
[im 9/46]
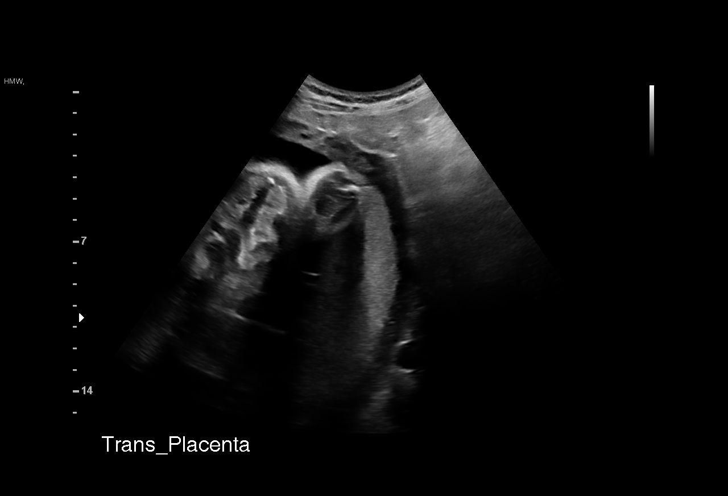
[im 12/46]
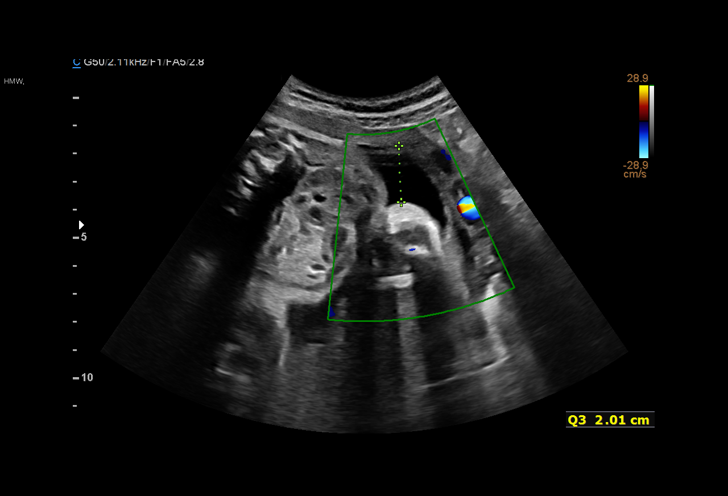
[im 16/46]
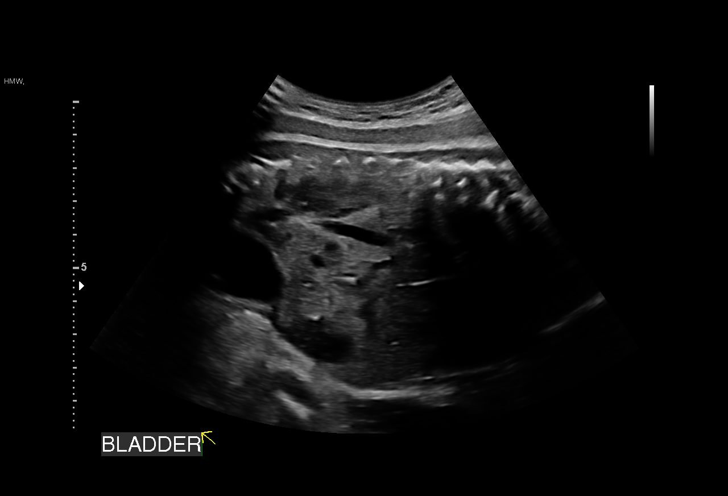
[im 19/46]
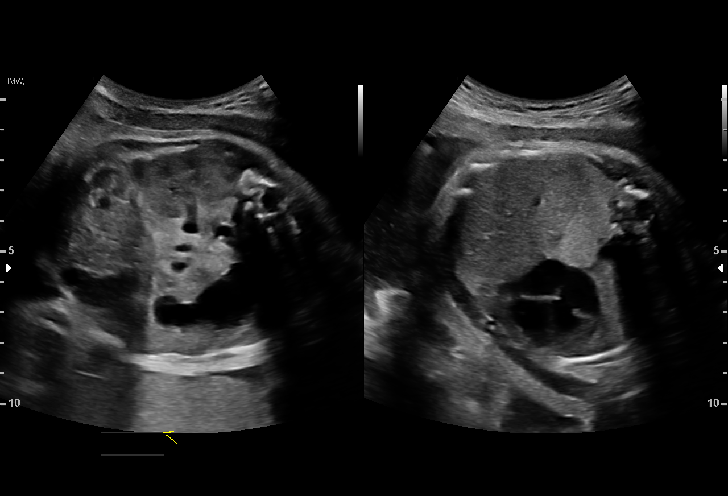
[im 24/46]
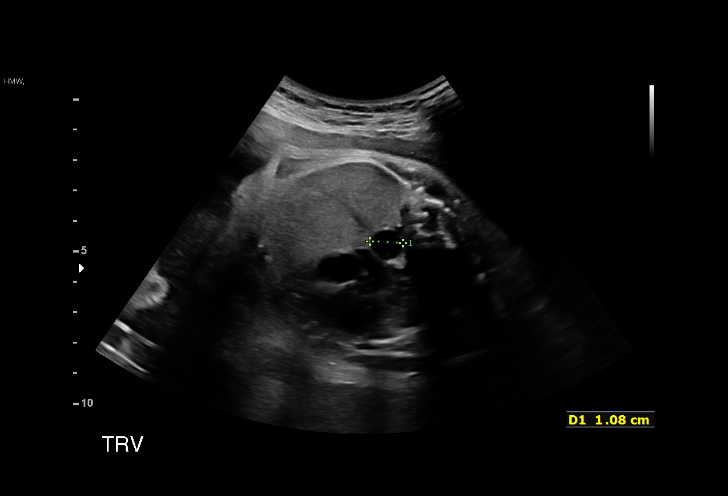
[im 27/46]
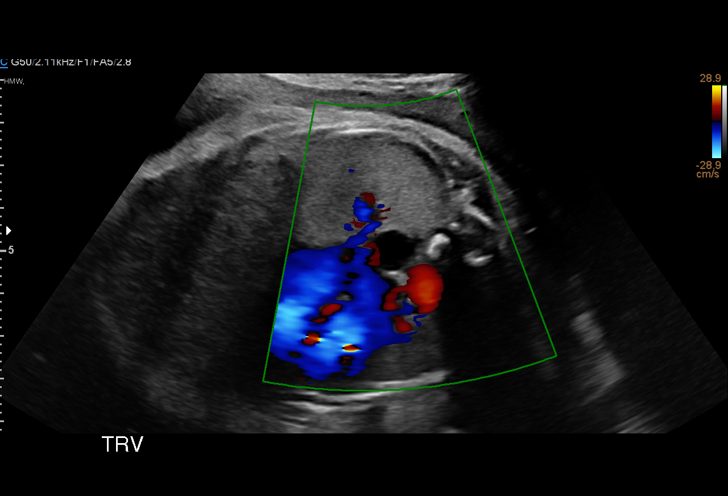
[im 31/46]
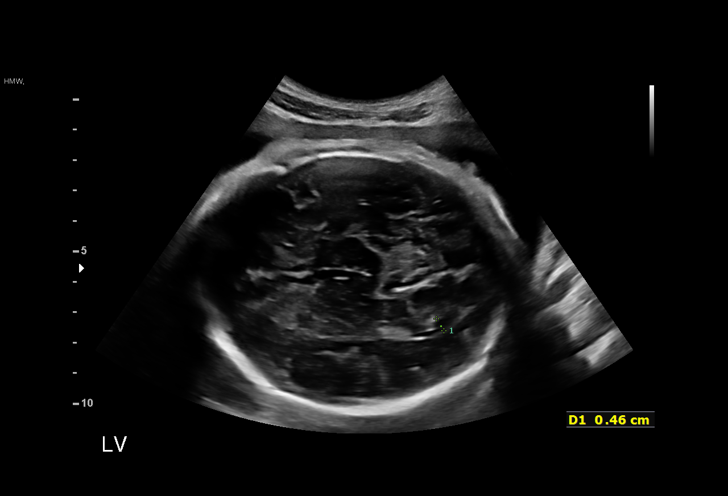
[im 34/46]
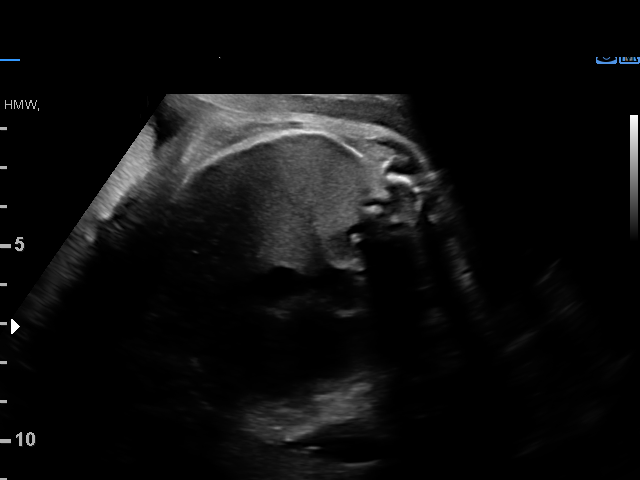
[im 37/46]
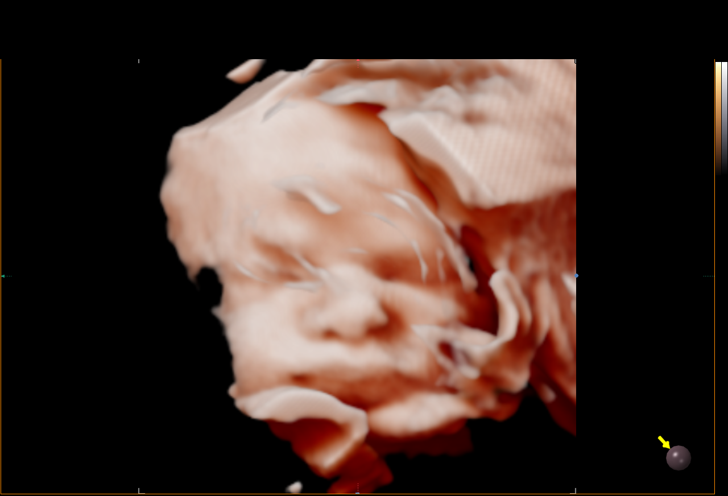
[im 41/46]
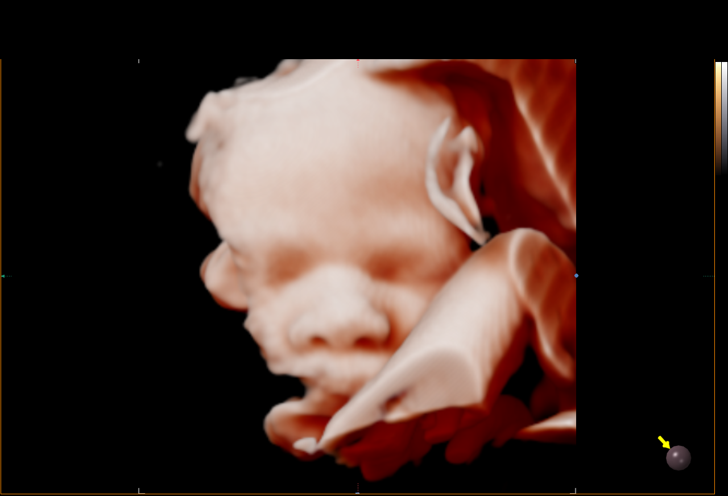
[im 44/46]
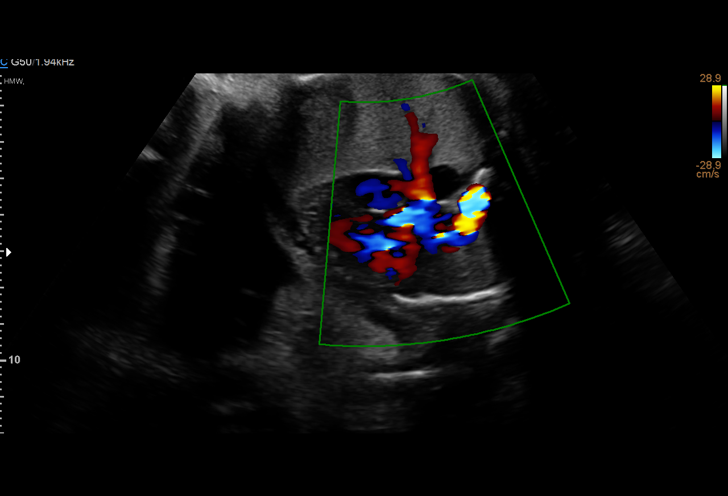

[13 of 28 positions shown; findings below may reference images not displayed]

OB/Gyn Clinic

 ----------------------------------------------------------------------

 ----------------------------------------------------------------------
Indications

  31 weeks gestation of pregnancy
  Advanced maternal age multigravida 35+,
  second trimester( Low Risk NIPS)
  Hypertension - Chronic/Pre-existing
  (labetalol)
  Encounter for other antenatal screening
  follow-up
 ----------------------------------------------------------------------
Vital Signs

                                                Height:        5'6"
Fetal Evaluation

 Num Of Fetuses:         1
 Cardiac Activity:       Observed
 Presentation:           Cephalic
 Placenta:               Posterior
 P. Cord Insertion:      Previously seen as normal

 Amniotic Fluid
 AFI FV:      Within normal limits

 AFI Sum(cm)     %Tile       Largest Pocket(cm)
 14.6            51
 RUQ(cm)       RLQ(cm)       LUQ(cm)        LLQ(cm)

Biometry

 BPD:      81.8  mm     G. Age:  32w 6d         89  %    CI:        76.09   %    70 - 86
                                                         FL/HC:      19.9   %    19.3 -
 HC:      297.2  mm     G. Age:  32w 6d         66  %    HC/AC:      1.08        0.96 -
 AC:      274.5  mm     G. Age:  31w 4d         62  %    FL/BPD:     72.1   %    71 - 87
 FL:         59  mm     G. Age:  30w 6d         30  %    FL/AC:      21.5   %    20 - 24
 HUM:        54  mm     G. Age:  31w 3d         61  %

 Est. FW:    1666  gm    3 lb 15 oz      66  %
OB History

 Gravidity:    2         Term:   1
 Living:       1
Gestational Age

 LMP:           31w 0d        Date:  10/27/17                 EDD:   08/03/18
 U/S Today:     32w 0d                                        EDD:   07/27/18
 Best:          31w 0d     Det. By:  LMP  (10/27/17)          EDD:   08/03/18
Anatomy

 Cranium:               Appears normal         LVOT:                   Appears normal
 Cavum:                 Previously seen        Aortic Arch:            Previously seen
 Ventricles:            Appears normal         Ductal Arch:            Previously seen
 Choroid Plexus:        Previously seen        Diaphragm:              Previously seen
 Cerebellum:            Previously seen        Stomach:                Appears normal, left
                                                                       sided
 Posterior Fossa:       Previously seen        Abdomen:                Previously seen
 Nuchal Fold:           Previously seen        Abdominal Wall:         Previously seen
 Face:                  Orbits previously      Cord Vessels:           Previously seen
                        seen
 Lips:                  Previously seen        Kidneys:                Appear normal
 Palate:                Previously seen        Bladder:                Appears normal
 Thoracic:              Previously seen        Spine:                  Previously seen
 Heart:                 Previously seen        Upper Extremities:      Previously seen
 RVOT:                  Appears normal         Lower Extremities:      Previously seen

 Other:  Nasal bone previously seen. Heels and 5th digit previously seen.
Cervix Uterus Adnexa

 Cervix
 Not visualized (advanced GA >64wks)
Impression

 Chronic hypertension. Patient takes labetalol.
 Patient returned for fetal growth assessment.
 Amniotic fluid is normal and good fetal activity is seen. Fetal
 growth is appropriate for gestational age. A solitary central
 cyst, measuring 1.3 x 1.1 cm, is seen. Color Doppler flow is
 negative. No hyperechogenicity is seen in the rest of the lung.
 Left lung appears normal.
 No evidence of fetal hydrops.

 I counseled the patient on the differential diagnoses:
 1.       Bronchogenic cyst: Usually solitary and is not
 associated with fetal adverse outcomes. Very rarely, it can
 cause tracheobronchial obstruction.
 2.       Congenital pulmonary airway obstruction. Usually
 associated with good outcomes.
 I also informed her that in some cases resolution of the cyst
 can occur with advancing gestation.

 We will monitor the cyst for increase in size on the follow-up
 scans.
Recommendations

 -Continue weekly antenatal testing till delivery.
                 Wasif, Shibbir Ahmed

## 2021-01-28 ENCOUNTER — Other Ambulatory Visit: Payer: Self-pay | Admitting: Family Medicine

## 2021-01-28 DIAGNOSIS — T7840XS Allergy, unspecified, sequela: Secondary | ICD-10-CM

## 2021-02-09 ENCOUNTER — Encounter (HOSPITAL_COMMUNITY): Payer: Self-pay | Admitting: Emergency Medicine

## 2021-02-09 ENCOUNTER — Ambulatory Visit (HOSPITAL_COMMUNITY)
Admission: EM | Admit: 2021-02-09 | Discharge: 2021-02-09 | Disposition: A | Discharge status: Home / Self Care | Payer: BC Managed Care – PPO | Attending: Emergency Medicine | Admitting: Emergency Medicine

## 2021-02-09 ENCOUNTER — Other Ambulatory Visit: Payer: Self-pay

## 2021-02-09 DIAGNOSIS — M545 Low back pain, unspecified: Secondary | ICD-10-CM

## 2021-02-09 MED ORDER — CYCLOBENZAPRINE HCL 10 MG PO TABS: 10.0000 mg | ORAL_TABLET | Freq: Every day | ORAL | 0 refills | Status: DC

## 2021-02-09 MED ORDER — MELOXICAM 7.5 MG PO TABS: 7.5000 mg | ORAL_TABLET | Freq: Every day | ORAL | 0 refills | Status: DC
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
## 2021-02-09 NOTE — Discharge Instructions (Signed)
Your pain is most likely caused by irritation to the muscles or ligaments.  ° °You may use heating pad in 15 minute intervals as needed for additional comfort, within the first 2-3 days you may find comfort in using ice in 10-15 minutes over affected area ° °Begin stretching affected area daily for 10 minutes as tolerated to further loosen muscles  ° °When lying down place pillow underneath and between knees for support ° °Can try sleeping without pillow on firm mattress  ° °Practice good posture: head back, shoulders back, chest forward, pelvis back and weight distributed evenly on both legs ° °If pain persist after recommended treatment or reoccurs if may be beneficial to follow up with orthopedic specialist for evaluation, this doctor specializes in the bones and can manage your symptoms long-term with options such as but not limited to imaging, medications or physical therapy  °  °
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
## 2021-02-15 ENCOUNTER — Encounter: Payer: Self-pay | Admitting: Allergy

## 2021-02-15 ENCOUNTER — Ambulatory Visit: Payer: BC Managed Care – PPO | Admitting: Allergy

## 2021-02-15 ENCOUNTER — Other Ambulatory Visit: Payer: Self-pay

## 2021-02-15 VITALS — BP 110/72 | HR 89 | Temp 97.9°F | Resp 18 | Ht 66.0 in | Wt 134.2 lb

## 2021-02-15 DIAGNOSIS — J302 Other seasonal allergic rhinitis: Secondary | ICD-10-CM | POA: Diagnosis not present

## 2021-02-15 DIAGNOSIS — H1010 Acute atopic conjunctivitis, unspecified eye: Secondary | ICD-10-CM

## 2021-02-15 DIAGNOSIS — L299 Pruritus, unspecified: Secondary | ICD-10-CM

## 2021-02-15 DIAGNOSIS — J3089 Other allergic rhinitis: Secondary | ICD-10-CM | POA: Diagnosis not present

## 2021-02-15 HISTORY — DX: Acute atopic conjunctivitis, unspecified eye: H10.10

## 2021-02-15 NOTE — Assessment & Plan Note (Addendum)
Past history - rhinoconjunctivitis symptoms during the spring and summer and takes over-the-counter antihistamines with good benefit.  2022 skin prick testing was positive for grass, weed, trees and cat. 2022 bloodwork positive to cat, dog, grass.  Interim history - controlled since no more pollen outdoors.  Continue environmental control measures as below.  Use over the counter antihistamines such as Zyrtec (cetirizine), Claritin (loratadine), Allegra (fexofenadine), or Xyzal (levocetirizine) daily as needed. May take twice a day during allergy flares. May switch antihistamines every few months.

## 2021-02-15 NOTE — Patient Instructions (Addendum)
Itching: Continue proper skin care. Not sure what's causing your itching.  Try to take zyrtec every other day. If no issues after 1 month then may stop.  If you notice itching or allergic symptoms then go back to previous dose.   Environmental allergies 2022 skin testing: Positive to grass, weed pollen, trees, cat. Continue environmental control measures as below. Use over the counter antihistamines such as Zyrtec (cetirizine), Claritin (loratadine), Allegra (fexofenadine), or Xyzal (levocetirizine) daily as needed. May take twice a day during allergy flares. May switch antihistamines every few months.  Follow up in 6 months or sooner if needed.   Pet Allergen Avoidance: Contrary to popular opinion, there are no hypoallergenic breeds of dogs or cats. That is because people are not allergic to an animals hair, but to an allergen found in the animal's saliva, dander (dead skin flakes) or urine. Pet allergy symptoms typically occur within minutes. For some people, symptoms can build up and become most severe 8 to 12 hours after contact with the animal. People with severe allergies can experience reactions in public places if dander has been transported on the pet owners clothing. Keeping an animal outdoors is only a partial solution, since homes with pets in the yard still have higher concentrations of animal allergens. Before getting a pet, ask your allergist to determine if you are allergic to animals. If your pet is already considered part of your family, try to minimize contact and keep the pet out of the bedroom and other rooms where you spend a great deal of time. As with dust mites, vacuum carpets often or replace carpet with a hardwood floor, tile or linoleum. High-efficiency particulate air (HEPA) cleaners can reduce allergen levels over time. While dander and saliva are the source of cat and dog allergens, urine is the source of allergens from rabbits, hamsters, mice and [place] pigs;  so ask a non-allergic family member to clean the animals cage. If you have a pet allergy, talk to your allergist about the potential for allergy immunotherapy (allergy shots). This strategy can often provide long-term relief.  Reducing Pollen Exposure Pollen seasons: trees (spring), grass (summer) and ragweed/weeds (fall). Keep windows closed in your home and car to lower pollen exposure.  Install air conditioning in the bedroom and throughout the house if possible.  Avoid going out in dry windy days - especially early morning. Pollen counts are highest between 5 - 10 AM and on dry, hot and windy days.  Save outside activities for late afternoon or after a heavy rain, when pollen levels are lower.  Avoid mowing of grass if you have grass pollen allergy. Be aware that pollen can also be transported indoors on people and pets.  Dry your clothes in an automatic dryer rather than hanging them outside where they might collect pollen.  Rinse hair and eyes before bedtime.  Skin care recommendations  Bath time: Always use lukewarm water. AVOID very hot or cold water. Keep bathing time to 5-10 minutes. Do NOT use bubble bath. Use a mild soap and use just enough to wash the dirty areas. Do NOT scrub skin vigorously.  After bathing, pat dry your skin with a towel. Do NOT rub or scrub the skin.  Moisturizers and prescriptions:  ALWAYS apply moisturizers immediately after bathing (within 3 minutes). This helps to lock-in moisture. Use the moisturizer several times a day over the whole body. Good summer moisturizers include: Aveeno, CeraVe, Cetaphil. Good winter moisturizers include: Aquaphor, Vaseline, Cerave, Cetaphil, Eucerin, Vanicream. When  using moisturizers along with medications, the moisturizer should be applied about one hour after applying the medication to prevent diluting effect of the medication or moisturize around where you applied the medications. When not using medications, the  moisturizer can be continued twice daily as maintenance.  Laundry and clothing: Avoid laundry products with added color or perfumes. Use unscented hypo-allergenic laundry products such as Tide free, Cheer free & gentle, and All free and clear.  If the skin still seems dry or sensitive, you can try double-rinsing the clothes. Avoid tight or scratchy clothing such as wool. Do not use fabric softeners or dyer sheets.

## 2021-02-15 NOTE — Assessment & Plan Note (Signed)
Past history - pruritus without any rash starting in November 2021.  This occurs on a daily basis if not taking Zyrtec. Denies any fevers, chills, changes in medications, diet or personal care products around this time. 2022 bloodwork unremarkable (CBC diff, CMP, TSH, CU, tryptase, alpha-gal) except for positive ANA with dsDNA ab. Interim history - rheumatology work up unremarkable. No itching as long as taking zyrtec 10mg  daily.  Continue proper skin care.  Not sure what's causing your itching.   Try to take zyrtec every other day. o If no issues after 1 month then may stop.   If you notice itching or allergic symptoms then go back to previous dose.

## 2021-02-15 NOTE — Progress Notes (Signed)
Follow Up Note  RE: Paige Neal MRN: [id number] DOB: May 14, 1980 Date of Office Visit: 02/15/2021  Referring provider: Cora Collum, DO Primary care provider: Cora Collum, DO  Chief Complaint: Pruritus (No issue as long as she is on her allergy medication. )  History of Present Illness: I had the pleasure of seeing Paige Neal for a follow up visit at the [facility] on 02/15/2021. She is a 40 y.o. female, who is being followed for pruritus, allergic rhinoconjunctivitis. Her previous allergy office visit was on 10/12/2020 with Dr. Selena Batten. Today is a regular follow up visit.  Pruritus Patient has been taking zyrtec 10mg  daily. There were a few episodes of missing a dose with no issues.    Other allergic rhinitis No issues since the weather got cold.    11/26/2020 rheumatology visit: "Positive ANA and positive double-stranded DNA but no clinical criteria for lupus or other systemic connective tissue disease appreciable.  We will recheck ANA for immunofluorescence pattern and titer also complements.  Lower pretest suspicion so if very low would not recommend routine follow-up at this time but can reassess for change in symptoms, if very abnormal observation at this time.   Pruritus   Sounds most consistent with allergic dermatitis or other eczema problem with minimal visible skin changes.  Question whether linear hyperpigmentation's are due to previous excoriations.  No active inflammatory changes appreciated on exam today.   Vitamin D deficiency - Plan: VITAMIN D 25 Hydroxy (Vit-D Deficiency, Fractures)   Discussed role of vitamin D for immune system function to be increased with skin inflammation activity.  We will check serum levels today and recommend replacement to >30."  Assessment and Plan: Talaysia is a 40 y.o. female with: Pruritus Past history - pruritus without any rash starting in November 2021.  This occurs on a daily basis if not  taking Zyrtec. Denies any fevers, chills, changes in medications, diet or personal care products around this time. 2022 bloodwork unremarkable (CBC diff, CMP, TSH, CU, tryptase, alpha-gal) except for positive ANA with dsDNA ab. Interim history - rheumatology work up unremarkable. No itching as long as taking zyrtec 10mg  daily. Continue proper skin care. Not sure what's causing your itching.  Try to take zyrtec every other day. If no issues after 1 month then may stop.  If you notice itching or allergic symptoms then go back to previous dose.   Seasonal and perennial allergic rhinoconjunctivitis Past history - rhinoconjunctivitis symptoms during the spring and summer and takes over-the-counter antihistamines with good benefit.  2022 skin prick testing was positive for grass, weed, trees and cat. 2022 bloodwork positive to cat, dog, grass.  Interim history - controlled since no more pollen outdoors. Continue environmental control measures as below. Use over the counter antihistamines such as Zyrtec (cetirizine), Claritin (loratadine), Allegra (fexofenadine), or Xyzal (levocetirizine) daily as needed. May take twice a day during allergy flares. May switch antihistamines every few months.  Return in about 6 months (around 08/16/2021).  No orders of the defined types were placed in this encounter.  Lab Orders  No laboratory test(s) ordered today    Diagnostics: None.   Medication List:  Current Outpatient Medications  Medication Sig Dispense Refill   amLODipine (NORVASC) 5 MG tablet TAKE 1 TABLET (5 MG TOTAL) BY MOUTH DAILY. 90 tablet 2   cetirizine (ZYRTEC) 10 MG tablet TAKE 1 TABLET BY MOUTH EVERY DAY 30 tablet 11   cyclobenzaprine (FLEXERIL) 10 MG tablet Take 1  tablet (10 mg total) by mouth at bedtime. 10 tablet 0   ferrous sulfate 325 (65 FE) MG tablet TAKE 1 TABLET BY MOUTH EVERY DAY WITH BREAKFAST 60 tablet 11   meloxicam (MOBIC) 7.5 MG tablet Take 1 tablet (7.5 mg total) by mouth  daily. 30 tablet 0   norethindrone (MICRONOR) 0.35 MG tablet Take 1 tablet (0.35 mg total) by mouth daily. 84 tablet 3   prenatal vitamin w/FE, FA (PRENATAL 1 + 1) 27-1 MG TABS tablet Take 2 tablets by mouth daily at 12 noon.      No current facility-administered medications for this visit.   Allergies: No Known Allergies I reviewed her past medical history, social history, family history, and environmental history and no significant changes have been reported from her previous visit.  Review of Systems  Constitutional:  Negative for appetite change, chills, fever and unexpected weight change.  HENT:  Negative for congestion and rhinorrhea.   Eyes:  Negative for itching.  Respiratory:  Negative for cough, chest tightness, shortness of breath and wheezing.   Cardiovascular:  Negative for chest pain.  Gastrointestinal:  Negative for abdominal pain.  Genitourinary:  Negative for difficulty urinating.  Skin:  Negative for rash.  Allergic/Immunologic: Positive for environmental allergies. Negative for food allergies.  Neurological:  Negative for headaches.   Objective: BP 110/72    Pulse 89    Temp 97.9 F (36.6 C)    Resp 18    Ht 5\' 6"  (1.676 m)    Wt 134 lb 3.2 oz (60.9 kg)    LMP 02/01/2021    SpO2 98%    BMI 21.66 kg/m  Body mass index is 21.66 kg/m. Physical Exam Vitals and nursing note reviewed.  Constitutional:      Appearance: Normal appearance. She is well-developed.  HENT:     Head: Normocephalic and atraumatic.     Right Ear: External ear normal.     Left Ear: External ear normal.     Nose: Nose normal.     Mouth/Throat:     Mouth: Mucous membranes are moist.     Pharynx: Oropharynx is clear.  Eyes:     Conjunctiva/sclera: Conjunctivae normal.  Cardiovascular:     Rate and Rhythm: Normal rate and regular rhythm.     Heart sounds: Normal heart sounds. No murmur heard.   No friction rub. No gallop.  Pulmonary:     Effort: Pulmonary effort is normal.     Breath  sounds: Normal breath sounds. No wheezing, rhonchi or rales.  Abdominal:     Palpations: Abdomen is soft.  Musculoskeletal:     Cervical back: Neck supple.  Skin:    General: Skin is warm.     Findings: No rash.  Neurological:     Mental Status: She is alert and oriented to person, place, and time.  Psychiatric:        Behavior: Behavior normal.   Previous notes and tests were reviewed. The plan was reviewed with the patient/family, and all questions/concerned were addressed.  It was my pleasure to see Paige Neal today and participate in her care. Please feel free to contact me with any questions or concerns.  Sincerely,  Trula Ore, DO Allergy & Immunology  [facility] office: [phone] [facility] office: [phone]

## 2021-03-29 ENCOUNTER — Other Ambulatory Visit: Payer: Self-pay | Admitting: Cardiology

## 2021-04-04 ENCOUNTER — Other Ambulatory Visit: Payer: Self-pay | Admitting: Obstetrics

## 2021-04-16 ENCOUNTER — Other Ambulatory Visit: Payer: Self-pay

## 2021-04-19 ENCOUNTER — Ambulatory Visit: Payer: BC Managed Care – PPO | Admitting: Family Medicine

## 2021-04-19 ENCOUNTER — Other Ambulatory Visit: Payer: Self-pay

## 2021-04-19 VITALS — BP 132/95 | HR 91 | Ht 66.0 in | Wt 140.6 lb

## 2021-04-19 DIAGNOSIS — N6323 Unspecified lump in the left breast, lower outer quadrant: Secondary | ICD-10-CM

## 2021-04-19 DIAGNOSIS — R221 Localized swelling, mass and lump, neck: Secondary | ICD-10-CM

## 2021-04-19 HISTORY — DX: Unspecified lump in the left breast, lower outer quadrant: N63.23

## 2021-04-19 MED ORDER — FERROUS SULFATE 325 (65 FE) MG PO TABS
ORAL_TABLET | ORAL | 11 refills | Status: DC
Start: 2021-04-19 — End: 2023-02-03

## 2021-04-19 NOTE — Patient Instructions (Signed)
It was wonderful to meet you today. Thank you for allowing me to be a part of your care. Below is a short summary of what we discussed at your visit today:  Left neck lump This appears to be fibrous tissue.  It is benign, meaning nonconcerning.  Keep an eye on this and if it becomes larger, red, warm, or draining any sort of fluids or pus please come back.  Left breast lump While your breast tissue did feel normal on palpation today, I am sending you for mammogram just to be sure.  I have ordered both a mammogram and an ultrasound.  This will be done at the [facility] breast center.  The order is placed and you will call them directly to make an appointment.      Cooking and Nutrition Classes [employer] in [place] provides many classes at low or no cost to [employer], nutrition, and agriculture.  Their website offers a huge variety of information related to topics such as gardening, nutrition, cooking, parenting, and health.  Also listed are classes and events, both online and in-person.  Check out their website here: [email or web address]   Please bring all of your medications to every appointment!  If you have any questions or concerns, please do not hesitate to contact us via phone or MyChart message.   Fayette Pho, MD

## 2021-04-19 NOTE — Progress Notes (Signed)
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 removal   PERTINENT  PMH / PSH:  Patient Active Problem List   Diagnosis Date Noted   Neck mass 04/23/2021   Mass of lower outer quadrant of left breast 04/19/2021   Seasonal and perennial allergic rhinoconjunctivitis 02/15/2021   Positive ANA (antinuclear antibody) 11/26/2020   Vitamin D deficiency 11/26/2020   Annual physical exam 07/20/2020   Hyperemesis gravidarum    Panic attack    Nausea 01/02/2020   Pruritus 01/02/2020   Stress 01/02/2020   Encounter for hepatitis C screening test for low risk patient 01/02/2020   Family history of diabetes mellitus 11/14/2019   Essential hypertension 12/01/2017   Breast mass 06/27/2011   Other allergic rhinitis 06/01/2009    OBJECTIVE:   BP (!) 132/95    Pulse 91    Ht 5\' 6"  (1.676 m)    Wt 140 lb 9.6 oz (63.8 kg)    LMP 04/09/2021    SpO2 99%    BMI 22.69 kg/m    PHQ-9:  Depression screen [facility] 2/9 04/19/2021 07/20/2020 01/01/2020  Decreased Interest 0 0 0  Down, Depressed, Hopeless 1 0 1  PHQ - 2 Score 1 0 1  Altered sleeping 0 0 0  Tired, decreased energy 1 2 1   Change in appetite 1 1 0  Feeling bad or failure about yourself  1 0 0  Trouble concentrating 0 0 0  Moving slowly or fidgety/restless 0 0 0  Suicidal thoughts 0 0 0  PHQ-9 Score 4 3 2   Difficult doing work/chores Not difficult at all Not difficult at all Not difficult at all  Some recent data might be hidden     GAD-7:  GAD 7 : Generalized Anxiety Score 01/01/2020 04/12/2018 03/22/2018 02/23/2018   Nervous, Anxious, on Edge 0 1 1 0  Control/stop worrying 0 0 0 0  Worry too much - different things 1 0 0 0  Trouble relaxing 0 0 0 0  Restless 0 0 0 0  Easily annoyed or irritable 2 1 1  0  Afraid - awful might happen 0 0 0 0  Total GAD 7 Score 3 2 2  0  Anxiety Difficulty Not difficult at all - - -     Physical Exam General: Awake, alert, oriented HEENT: Palpable mobile fibrous tissue approximately 2 mm in diameter overlying left anterior trapezius; no overlying erythema, discharge, tenderness, warm Cardiovascular: Regular rate and rhythm, S1 and S2 present, no murmurs auscultated Respiratory: Lung fields clear to auscultation bilaterally Extremities: No bilateral lower extremity edema, palpable pedal and pretibial pulses bilaterally Neuro: Cranial nerves II through X grossly intact, able to move all extremities spontaneously Chaperoned breast exam: Right breast normal, no palpable nodules.  Small palpable nodule of left lateral breast without overlying erythema, nontender to palpation.  Left breast otherwise normal      ASSESSMENT/PLAN:   Mass of lower outer quadrant of left breast Acute.  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  Will send for left breast ultrasound and diagnostic mammography.  Return precautions and follow-up instructions discussed.  See AVS for more.  Neck mass Chronic, gradually improving.  41-month duration.  Palpable mobile nontender 2 mm nodule overlying anterior left trapezius.  Differential includes fibrous tissue versus cyst.  Given gradual improvement, nonadherence to underlying structures, and absence of redness/swelling/warmth, opt for observation.  Patient amenable.  Return precautions discussed.     Paige Essex, MD [place]

## 2021-04-23 DIAGNOSIS — R221 Localized swelling, mass and lump, neck: Secondary | ICD-10-CM

## 2021-04-23 HISTORY — DX: Localized swelling, mass and lump, neck: R22.1

## 2021-04-23 NOTE — Assessment & Plan Note (Signed)
Acute.  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  Will send for left breast ultrasound and diagnostic mammography.  Return precautions and follow-up instructions discussed.  See AVS for more.

## 2021-04-23 NOTE — Assessment & Plan Note (Signed)
Chronic, gradually improving.  3-month duration.  Palpable mobile nontender 2 mm nodule overlying anterior left trapezius.  Differential includes fibrous tissue versus cyst.  Given gradual improvement, nonadherence to underlying structures, and absence of redness/swelling/warmth, opt for observation.  Patient amenable.  Return precautions discussed.

## 2021-04-29 ENCOUNTER — Other Ambulatory Visit: Payer: Self-pay

## 2021-04-29 MED ORDER — NORETHINDRONE 0.35 MG PO TABS: 1.0000 | ORAL_TABLET | Freq: Every day | ORAL | 3 refills | Status: DC

## 2021-07-01 ENCOUNTER — Telehealth: Payer: Self-pay

## 2021-07-01 NOTE — Progress Notes (Signed)
? ?Office Visit Note ? ?Patient: Paige Neal             ?Date of Birth: [birth year]           ?MRN: [id number]             ?PCP: Shary Key, DO ?Referring: Shary Key, DO ?Visit Date: 07/05/2021 ? ? ?Subjective:  ?Follow-up (Right groin pain, left hand pain) ? ? ?History of Present Illness: Paige Neal is a 41 y.o. female here for follow up  for positive ANA and dsDNA with skin itching and allergies. Since about 2 weeks ago having increased issues she feels fatigued more than usual and having pain in her hands worst around the 4th fingers. This comes and goes not every day, not associated with obvious swelling or discoloration. She feels her hand is a bit weaker than usual. She also has pain in the right hip feels this is going to the groin area. No radiation of symptoms, and also coming and going. Her itching is much improved on the daily cetirizine.  ? ?Previous HPI ?11/26/2020 ? Paige Neal is a 41 y.o. female here for positive ANA and dsDNA with skin itching and allergies. Symptoms were gradual onset but most of the past 1 year. She experiences generalized areas of itching and some skin changes associated with the excoriations. She does also have allergic rhinitis symptoms. Oral antihistamines recommended by PCP and improved her symptoms almost completely. She was referred to allergy clinic due to recurrence of her symptoms with any attempt to decrease medication.  Lab test were positive for ANA and double-stranded DNA antibodies otherwise unremarkable.  She has experienced some joint pain not associated with significant redness swelling or decreased range of motion.  She has noticed some thinning of hair in the frontal and temporal areas not sure whether this is related to her most recent pregnancy as she reported similar standing after her first child was born.  She denies oral ulcers lymphadenopathy photosensitivity or any history of blood clots.  She reports occasional  purplish discoloration of the tips of her fingers with cold exposure never any blistering ulceration or pitting. ?  ?Labs reviewed ?09/2020 ?ANA pos ?dsDNA 28 ?RNP, SM, SSA, SSB neg ?ESR 4 ?TSH 1.260 ?CBC wnl ?CMP wnl ? ? ?Review of Systems  ?Constitutional:  Positive for fatigue.  ?HENT:  Negative for mouth dryness.   ?Eyes:  Negative for dryness.  ?Respiratory:  Negative for shortness of breath.   ?Cardiovascular:  Negative for swelling in legs/feet.  ?Gastrointestinal:  Negative for constipation.  ?Endocrine: Positive for cold intolerance.  ?Genitourinary:  Negative for difficulty urinating.  ?Musculoskeletal:  Positive for joint pain, joint pain and muscle tenderness.  ?Skin:  Negative for rash.  ?Allergic/Immunologic: Positive for susceptible to infections.  ?Neurological:  Negative for numbness.  ?Hematological:  Negative for bruising/bleeding tendency.  ?Psychiatric/Behavioral:  Positive for sleep disturbance.   ? ?PMFS History:  ?Patient Active Problem List  ? Diagnosis Date Noted  ? Bilateral hand pain 07/05/2021  ? Pain in right hip 07/05/2021  ? Neck mass 04/23/2021  ? Mass of lower outer quadrant of left breast 04/19/2021  ? Seasonal and perennial allergic rhinoconjunctivitis 02/15/2021  ? Positive ANA (antinuclear antibody) 11/26/2020  ? Vitamin D deficiency 11/26/2020  ? Annual physical exam 07/20/2020  ? Hyperemesis gravidarum   ? Panic attack   ? Nausea 01/02/2020  ? Pruritus 01/02/2020  ? Stress 01/02/2020  ? Encounter for hepatitis C screening  test for low risk patient 01/02/2020  ? Family history of diabetes mellitus 11/14/2019  ? Essential hypertension 12/01/2017  ? Breast mass 06/27/2011  ? Other allergic rhinitis 06/01/2009  ?  ?Past Medical History:  ?Diagnosis Date  ? Annual physical exam 07/20/2020  ? Breast mass 06/27/2011  ? EXAM: 08/2013 DIGITAL DIAGNOSTIC BILATERAL MAMMOGRAM WITH 3D TOMOSYNTHESIS WITH CAD ULTRASOUND LEFT BREAST COMPARISON: 07/05/2011. 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 RECOMMENDATION: Left breast ultrasound in 6 months. The options of ultrasound-guided core needle biopsy and surgical excision were also discussed with the patient but not recommended at this time. She is currently co  ? Encounter for hepatitis C screening test for low risk patient 01/02/2020  ? Essential hypertension 12/01/2017  ? Family history of diabetes mellitus 11/14/2019  ? Hyperemesis gravidarum   ? Nausea 01/02/2020  ? Other allergic rhinitis 06/01/2009  ? Qualifier: Diagnosis of  By: Carlena Sax  MD, [place]    ? Panic attack   ? Positive ANA (antinuclear antibody) 11/26/2020  ? Pruritus 01/02/2020  ? Stress 01/02/2020  ? Vitamin D deficiency 11/26/2020  ?  ?Family History  ?Problem Relation Age of Onset  ? Coronary artery disease Mother   ? Hypertension Mother   ? Hypertension Father   ? Hypertension Sister   ? Multiple sclerosis Sister   ? ?Past Surgical History:  ?Procedure Laterality Date  ? BREAST EXCISIONAL BIOPSY Bilateral   ? BREAST FIBROADENOMA SURGERY    ? gardisil x 3 - 08/30/2005    ? ?Social History  ? ?Social History Narrative  ? Pt lives in [place] and works at [employer],  single  ? ?Immunization History  ?Administered Date(s) Administered  ? Influenza Split 11/25/2010, 03/05/2012  ? Influenza Whole 01/02/2009, 12/03/2009  ? Influenza,inj,Quad PF,6+ Mos 01/01/2013, 11/25/2013, 11/21/2014, 12/08/2015, 11/27/2017, 11/13/2018, 11/14/2019  ? Influenza-Unspecified 12/06/2020  ? PFIZER Comirnaty(Gray Top)Covid-19 Tri-Sucrose Vaccine 04/17/2020  ? PFIZER(Purple Top)SARS-COV-2 Vaccination 09/07/2019, 09/28/2019  ? Tdap 06/24/2011, 05/03/2018  ?  ? ?Objective: ?Vital Signs: BP 114/68 (BP Location: Left Arm, Patient Position: Sitting, Cuff Size: Normal)   Pulse 78   Resp 15   Ht 5' 6"  (1.676 m)   Wt 138 lb (62.6 kg)   LMP 06/18/2021 (Approximate)   BMI 22.27 kg/m?   ? ?Physical Exam ?Cardiovascular:  ?   Rate and Rhythm: Normal rate and regular rhythm.  ?Pulmonary:  ?   Effort: Pulmonary effort  is normal.  ?   Breath sounds: Normal breath sounds.  ?Musculoskeletal:  ?   Right lower leg: No edema.  ?   Left lower leg: No edema.  ?Skin: ?   General: Skin is warm and dry.  ?   Findings: No rash.  ?Neurological:  ?   Mental Status: She is alert.  ?   Motor: No weakness.  ?   Deep Tendon Reflexes: Reflexes normal.  ?Psychiatric:     ?   Mood and Affect: Mood normal.  ?  ? ?Musculoskeletal Exam:  ?Elbows full ROM no tenderness or swelling ?Wrists full ROM no tenderness or swelling ?Fingers full ROM no tenderness or swelling ?Hip normal internal and external rotation without pain, no tenderness to lateral hip palpation ?Knees full ROM no tenderness or swelling ? ? ?Investigation: ?No additional findings. ? ?Imaging: ?US BREAST LTD UNI LEFT INC AXILLA ? ?Result Date: 07/02/2021 ?CLINICAL DATA:  41 year old female with palpable lump in the OUTER LEFT breast discovered on self-examination and clinical examination. Also for annual bilateral mammogram. History of  fibroadenomas and breast surgeries. EXAM: DIGITAL DIAGNOSTIC BILATERAL MAMMOGRAM WITH TOMOSYNTHESIS AND CAD; ULTRASOUND LEFT BREAST LIMITED TECHNIQUE: Bilateral digital diagnostic mammography and breast tomosynthesis was performed. The images were evaluated with computer-aided detection.; Targeted ultrasound examination of the left breast was performed. COMPARISON:  Previous exam(s). ACR Breast Density Category d: The breast tissue is extremely dense, which lowers the sensitivity of mammography. FINDINGS: 2D/3D full field views of both breasts demonstrate no suspicious mass, nonsurgical distortion or worrisome calcifications. Targeted ultrasound is performed, showing a 0.4 x 0.2 x 0.4 cm circumscribed oval hypoechoic mass at the 4 o'clock position of the LEFT breast 4 cm from the nipple. Scattered incidental benign cysts within the LEFT breast are noted with a 0.6 cm cyst at the 12 o'clock position 5 cm from the nipple and 2 o'clock position 5 cm from the  nipple. No other sonographic abnormalities are noted within the OUTER LEFT breast. IMPRESSION: 1. 0.4 cm likely benign mass in the LOWER OUTER LEFT breast. Options of tissue sampling and short-term follow-up were p

## 2021-07-01 NOTE — Telephone Encounter (Signed)
Patient left a voicemail stating she had an appointment with Dr. Dimple Casey last September, 2022 and requested to speak with either Dr. Dimple Casey or his nurse before she schedules a follow-up appointment.   ?

## 2021-07-02 ENCOUNTER — Ambulatory Visit
Admission: RE | Admit: 2021-07-02 | Discharge: 2021-07-02 | Disposition: A | Discharge status: Home / Self Care | Payer: BC Managed Care – PPO | Source: Ambulatory Visit | Attending: Family Medicine | Admitting: Family Medicine

## 2021-07-02 ENCOUNTER — Encounter: Payer: Self-pay | Admitting: Family Medicine

## 2021-07-02 ENCOUNTER — Other Ambulatory Visit: Payer: Self-pay | Admitting: Family Medicine

## 2021-07-02 DIAGNOSIS — N6323 Unspecified lump in the left breast, lower outer quadrant: Secondary | ICD-10-CM

## 2021-07-05 ENCOUNTER — Encounter: Payer: Self-pay | Admitting: Internal Medicine

## 2021-07-05 ENCOUNTER — Ambulatory Visit: Payer: BC Managed Care – PPO | Admitting: Internal Medicine

## 2021-07-05 VITALS — BP 114/68 | HR 78 | Resp 15 | Ht 66.0 in | Wt 138.0 lb

## 2021-07-05 DIAGNOSIS — R768 Other specified abnormal immunological findings in serum: Secondary | ICD-10-CM

## 2021-07-05 DIAGNOSIS — M79642 Pain in left hand: Secondary | ICD-10-CM

## 2021-07-05 DIAGNOSIS — E559 Vitamin D deficiency, unspecified: Secondary | ICD-10-CM | POA: Diagnosis not present

## 2021-07-05 DIAGNOSIS — M25551 Pain in right hip: Secondary | ICD-10-CM | POA: Insufficient documentation

## 2021-07-05 DIAGNOSIS — L299 Pruritus, unspecified: Secondary | ICD-10-CM

## 2021-07-05 DIAGNOSIS — M79641 Pain in right hand: Secondary | ICD-10-CM

## 2021-07-05 HISTORY — DX: Pain in right hip: M25.551

## 2021-07-05 HISTORY — DX: Pain in right hand: M79.641

## 2021-07-05 HISTORY — DX: Pain in right hand: M79.642

## 2021-07-05 NOTE — Patient Instructions (Signed)
We are rechecking the previous antibody test that was low positive suggestive for an inflammatory problem. ? ?For intermittent symptoms right now I recommend try to take NSAIDs, medication such as ibuprofen or naproxen (Aleve, naprosyn) as needed these are usually beneficial for joint pain or inflammation. ?

## 2021-07-06 LAB — ANTI-DNA ANTIBODY, DOUBLE-STRANDED: ds DNA Ab: 32 IU/mL — ABNORMAL HIGH

## 2021-07-20 ENCOUNTER — Encounter: Payer: Self-pay | Admitting: Internal Medicine

## 2021-07-20 ENCOUNTER — Telehealth: Payer: Self-pay | Admitting: Internal Medicine

## 2021-07-20 NOTE — Telephone Encounter (Signed)
I spoke with Ms. Garden her results show persistent low positive dsDNA Ab titer. 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 I recommend trial of hydroxychloroquine for treatment based on this. She has some questions and concerns since diagnosis is unclear. Will send medication information to review at this time.

## 2021-07-20 NOTE — Telephone Encounter (Signed)
Patient called the office stating she had an appointment a few weeks ago and had labs at that appointment. Patient states she was told someone would call her with those results and no one has reached out yet. Patient requests a return call with her results.

## 2021-08-03 ENCOUNTER — Encounter: Payer: Self-pay | Admitting: *Deleted

## 2021-08-16 ENCOUNTER — Ambulatory Visit: Payer: BC Managed Care – PPO | Admitting: Allergy

## 2021-08-21 ENCOUNTER — Ambulatory Visit
Admission: EM | Admit: 2021-08-21 | Discharge: 2021-08-21 | Disposition: A | Discharge status: Home / Self Care | Payer: BC Managed Care – PPO | Attending: Family Medicine | Admitting: Family Medicine

## 2021-08-21 ENCOUNTER — Encounter: Payer: Self-pay | Admitting: Emergency Medicine

## 2021-08-21 DIAGNOSIS — J069 Acute upper respiratory infection, unspecified: Secondary | ICD-10-CM | POA: Diagnosis not present

## 2021-08-21 DIAGNOSIS — J029 Acute pharyngitis, unspecified: Secondary | ICD-10-CM | POA: Diagnosis not present

## 2021-08-21 LAB — POCT RAPID STREP A (OFFICE): Rapid Strep A Screen: NEGATIVE

## 2021-08-21 MED ORDER — BENZONATATE 100 MG PO CAPS: 100.0000 mg | ORAL_CAPSULE | Freq: Three times a day (TID) | ORAL | 0 refills | Status: DC | PRN

## 2021-08-21 NOTE — ED Triage Notes (Signed)
Pt said she has a lot of mucus and phlegm in her nasal that is in her throat and now has a sore throat and cough. Symptoms started Tuesday. Pt said low grade fevers. Pt said she was only taking tylenol.

## 2021-08-22 ENCOUNTER — Encounter: Payer: Self-pay | Admitting: Internal Medicine

## 2021-08-23 LAB — CULTURE, GROUP A STREP (THRC)

## 2021-08-23 LAB — NOVEL CORONAVIRUS, NAA: SARS-CoV-2, NAA: NOT DETECTED

## 2021-12-02 ENCOUNTER — Ambulatory Visit: Payer: BC Managed Care – PPO

## 2021-12-03 ENCOUNTER — Ambulatory Visit: Payer: BC Managed Care – PPO | Admitting: Family Medicine

## 2021-12-03 ENCOUNTER — Other Ambulatory Visit (HOSPITAL_COMMUNITY)
Admission: RE | Admit: 2021-12-03 | Discharge: 2021-12-03 | Disposition: A | Discharge status: Home / Self Care | Payer: BC Managed Care – PPO | Source: Ambulatory Visit | Attending: Family Medicine | Admitting: Family Medicine

## 2021-12-03 VITALS — BP 110/80 | HR 82 | Temp 99.2°F | Wt 139.0 lb

## 2021-12-03 DIAGNOSIS — N898 Other specified noninflammatory disorders of vagina: Secondary | ICD-10-CM

## 2021-12-03 DIAGNOSIS — Z113 Encounter for screening for infections with a predominantly sexual mode of transmission: Secondary | ICD-10-CM

## 2021-12-03 HISTORY — DX: Encounter for screening for infections with a predominantly sexual mode of transmission: Z11.3

## 2021-12-03 LAB — POCT WET PREP (WET MOUNT)
Clue Cells Wet Prep Whiff POC: NEGATIVE
Trichomonas Wet Prep HPF POC: ABSENT

## 2021-12-03 LAB — POCT URINE PREGNANCY: Preg Test, Ur: NEGATIVE

## 2021-12-03 MED ORDER — FLUCONAZOLE 150 MG PO TABS: 150.0000 mg | ORAL_TABLET | Freq: Once | ORAL | 0 refills | Status: AC

## 2021-12-03 NOTE — Patient Instructions (Signed)
It was wonderful to meet you today. Thank you for allowing me to be a part of your care. Below is a short summary of what we discussed at your visit today:  Vaginal complaint Today we collected a couple vaginal swabs. The result was negative for yeast and trichomonas.  Given your history, I will go ahead and send in a prescription for treatment for yeast infection.  This is fluconazole, 1 pill taken 1 time.  If your symptoms improve, then we can attribute your symptoms to yeast.  STI screening - Today we obtained a vaginal swab to screen for gonorrhea, chlamydia, and trichomonas - We also obtained a blood sample to screen for HIV and syphilis - As above, if the results are normal, I will send you a letter or MyChart message. If the results are abnormal, I will give you a call.     Pap smear Your last Pap smear was in June of 2019, and will be due in June of 2024.  You may call ahead in a month or 2 in advance to get that scheduled at your convenience.  Vaccines Today you declined the annual flu vaccine.  If you change your mind at any time, you may simply call the clinic and make a vaccine only appointment with a nurse at your convenience.  We recommend getting your flu shot every year.  While it does not fully prevent you from getting the flu, it does reduce symptom severity and keep you out of the hospital.   Please bring all of your medications to every appointment!  If you have any questions or concerns, please do not hesitate to contact us via phone or MyChart message.   Ezequiel Essex, MD

## 2021-12-03 NOTE — Progress Notes (Signed)
    SUBJECTIVE:   CHIEF COMPLAINT / HPI:   Vaginal complaint Here for an STI check.  -Concern for specific exposure? No -Current symptoms: vaginal itching of one month duration -Contraception: None -Consistent barrier method use: None -LMP: 11/10/2021, has had unprotected intercourse since LMP -PAP: UTD, last 08/03/2017 NILM, negative HPV   PERTINENT  PMH / PSH: HTN, breast mass, vitamin D deficiency, allergic rhinitis  OBJECTIVE:   BP 110/80   Pulse 82   Temp 99.2 F (37.3 C)   Wt 139 lb (63 kg)   SpO2 98%   BMI 22.44 kg/m    Physical Exam General: Awake, alert, oriented, no acute distress Respiratory: Normal work of breathing, no respiratory distress Neuro: Cranial nerves II through X grossly intact, able to move all extremities spontaneously Vulva: Normal appearing vulva without rashes, lesions, or deformities Vagina: Pale pink rugated vaginal tissue without obvious lesions, physiologic discharge of whitish color, cervix without lesion or overt tenderness with swab  Sensitive exam performed with chaperone in the room:  Leonia Corona, CMA  ASSESSMENT/PLAN:   Routine screening for STI (sexually transmitted infection) Patient amenable to vaginal swab for gonorrhea, chlamydia, and trichomonas today.  Also blood work for HIV and RPR.  We will follow results.  Vaginal pruritis HPI and physical consistent with yeast infection.  Wet prep resulted with bacteria only, no yeast.  Given she has had briefly intercourse since her LMP of 9/13 and is not on contraception, we obtained urine sample for pregnancy test which was negative.  Given history and physical, will proceed with fluconazole prescription.     Ezequiel Essex, MD [place]

## 2021-12-03 NOTE — Assessment & Plan Note (Addendum)
HPI and physical consistent with yeast infection.  Wet prep resulted with bacteria only, no yeast.  Given she has had briefly intercourse since her LMP of 9/13 and is not on contraception, we obtained urine sample for pregnancy test which was negative.  Given history and physical, will proceed with fluconazole prescription.

## 2021-12-03 NOTE — Assessment & Plan Note (Addendum)
Patient amenable to vaginal swab for gonorrhea, chlamydia, and trichomonas today.  Also blood work for HIV and RPR.  We will follow results.

## 2021-12-04 LAB — RPR: RPR Ser Ql: NONREACTIVE

## 2021-12-04 LAB — HIV ANTIBODY (ROUTINE TESTING W REFLEX): HIV Screen 4th Generation wRfx: NONREACTIVE

## 2021-12-06 LAB — CERVICOVAGINAL ANCILLARY ONLY
Chlamydia: NEGATIVE
Comment: NEGATIVE
Comment: NORMAL
Neisseria Gonorrhea: NEGATIVE

## 2021-12-30 ENCOUNTER — Other Ambulatory Visit: Payer: Self-pay | Admitting: Cardiology

## 2022-01-06 ENCOUNTER — Other Ambulatory Visit: Payer: Self-pay | Admitting: Family Medicine

## 2022-01-06 ENCOUNTER — Ambulatory Visit
Admission: RE | Admit: 2022-01-06 | Discharge: 2022-01-06 | Disposition: A | Discharge status: Home / Self Care | Payer: BC Managed Care – PPO | Source: Ambulatory Visit | Attending: Family Medicine | Admitting: Family Medicine

## 2022-01-06 DIAGNOSIS — N6323 Unspecified lump in the left breast, lower outer quadrant: Secondary | ICD-10-CM

## 2022-02-07 ENCOUNTER — Other Ambulatory Visit: Payer: Self-pay | Admitting: Cardiology

## 2022-02-10 ENCOUNTER — Other Ambulatory Visit: Payer: Self-pay | Admitting: Family Medicine

## 2022-02-10 DIAGNOSIS — T7840XS Allergy, unspecified, sequela: Secondary | ICD-10-CM

## 2022-04-11 ENCOUNTER — Encounter: Payer: Self-pay | Admitting: Family Medicine

## 2022-04-11 NOTE — Telephone Encounter (Signed)
Spoke with patient.   She denies any trauma or bites to the areas. She reports they areas do sting and burn.   She denies any fevers or tingling.   Patient scheduled for tomorrow for evaluation.

## 2022-04-12 ENCOUNTER — Ambulatory Visit: Payer: No Typology Code available for payment source | Admitting: Family Medicine

## 2022-04-12 VITALS — BP 118/74 | HR 81 | Wt 138.0 lb

## 2022-04-12 DIAGNOSIS — L139 Bullous disorder, unspecified: Secondary | ICD-10-CM

## 2022-04-12 HISTORY — DX: Bullous disorder, unspecified: L13.9

## 2022-04-12 MED ORDER — PREDNISONE 20 MG PO TABS: 40.0000 mg | ORAL_TABLET | Freq: Every day | ORAL | 0 refills | Status: AC

## 2022-04-12 MED ORDER — HYDROXYZINE HCL 10 MG PO TABS
10.0000 mg | ORAL_TABLET | Freq: Three times a day (TID) | ORAL | 0 refills | Status: DC | PRN
Start: 2022-04-12 — End: 2023-02-03

## 2022-04-12 NOTE — Progress Notes (Signed)
    SUBJECTIVE:   CHIEF COMPLAINT / HPI:  Chief Complaint  Patient presents with   Blister         Mildly painful pruritic bumps/blisters on both and left knee Two on right hand, two on left knee, one on left 4th finger Started 2 days ago when she woke up She noticed quite a bit of swelling in the left knee, had some difficulty with movement of the knee but this has improved significantly No fever, chills No new medications No gardening, no known trauma No history of cold sores or genital herpes 2 years ago had similar bump but it was larger on foot, went to urgent care; on chart it looks like possible abscess, had I&D  She has occasional joint pain in right hip and right hand, never started hydroxychloroquine  PERTINENT  PMH / PSH: positive ANA (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 Cardiovascular:     Rate and Rhythm: Normal rate and regular rhythm.  Pulmonary:     Effort: Pulmonary effort is normal. No respiratory distress.     Breath sounds: Normal breath sounds.  Musculoskeletal:     Cervical back: Neck supple.     Comments: Left knee with no significant swelling or deformity.  Flexion and extension without pain.  Skin:    Comments: Two bullous lesions noted in the right distal forearm with minimal surrounding erythema, there is some swelling noted at the right distal forearm/wrist.  Left distal forearm with vesicular lesion and surrounding linear area of erythema.  Left fourth finger with vesicular/papular lesion noted with no significant surrounding erythema.  Left knee with 2 separate healing blistered lesions  Neurological:     Mental Status: She is alert.       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  Could also consider viral cause such as HSV though no history of this. - offered biopsy, patient declined - treat empirically with prednisone 40 mg x 5d - hydroxyzine prn for itching - consider dermatology referral    Return in about 1 week (around 04/19/2022) for f/u rash.   Zola Button, MD [place]

## 2022-04-12 NOTE — Assessment & Plan Note (Addendum)
Patient presenting with 2 days of diffuse vesicular/bullous rash with unknown trigger, associated with pruritus and mild pain.  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  Could also consider viral cause such as HSV though no history of this. - offered biopsy, patient declined - treat empirically with prednisone 40 mg x 5d - hydroxyzine prn for itching - consider dermatology referral

## 2022-04-12 NOTE — Patient Instructions (Addendum)
It was nice seeing you today!  Take steroids as prescribed for 5 days.  You can use hydroxyzine as needed for itching.  Stay well, Paige Button, MD [place] [phone]  --  Make sure to check out at the front desk before you leave today.  Please arrive at least 15 minutes prior to your scheduled appointments.  If you had blood work today, I will send you a MyChart message or a letter if results are normal. Otherwise, I will give you a call.  If you had a referral placed, they will call you to set up an appointment. Please give [place] a call if you don't hear back in the next 2 weeks.  If you need additional refills before your next appointment, please call your pharmacy first.

## 2022-04-25 ENCOUNTER — Ambulatory Visit: Payer: No Typology Code available for payment source | Admitting: Family Medicine

## 2022-04-28 ENCOUNTER — Other Ambulatory Visit: Payer: Self-pay | Admitting: Cardiology

## 2022-04-28 MED ORDER — AMLODIPINE BESYLATE 5 MG PO TABS: 5.0000 mg | ORAL_TABLET | Freq: Every day | ORAL | 0 refills | Status: DC

## 2022-05-17 ENCOUNTER — Other Ambulatory Visit: Payer: Self-pay

## 2022-05-17 ENCOUNTER — Telehealth: Payer: Self-pay | Admitting: Cardiology

## 2022-05-17 MED ORDER — AMLODIPINE BESYLATE 5 MG PO TABS: 5.0000 mg | ORAL_TABLET | Freq: Every day | ORAL | 0 refills | Status: DC

## 2022-05-17 NOTE — Telephone Encounter (Signed)
*  STAT* If patient is at the pharmacy, call can be transferred to refill team.   1. Which medications need to be refilled? (please list name of each medication and dose if known)           amLODipine (NORVASC) 5 MG tablet     2. Which pharmacy/location (including street and city if local pharmacy) is medication to be sent to? [id number] - [place], [place] - [address]   3. Do they need a 30 day or 90 day supply? 30 day

## 2022-05-17 NOTE — Telephone Encounter (Signed)
Left vm for pt to call and schedule and appointment as she has not been seen since 01/01/21.  Appointment has been made for 3/21

## 2022-05-19 ENCOUNTER — Ambulatory Visit: Payer: No Typology Code available for payment source | Attending: Cardiology | Admitting: Cardiology

## 2022-05-19 ENCOUNTER — Encounter: Payer: Self-pay | Admitting: Cardiology

## 2022-05-19 ENCOUNTER — Ambulatory Visit: Payer: No Typology Code available for payment source | Admitting: Cardiology

## 2022-05-19 VITALS — BP 118/73 | HR 84 | Ht 66.0 in | Wt 140.0 lb

## 2022-05-19 DIAGNOSIS — I10 Essential (primary) hypertension: Secondary | ICD-10-CM | POA: Diagnosis not present

## 2022-05-19 MED ORDER — AMLODIPINE BESYLATE 5 MG PO TABS: 5.0000 mg | ORAL_TABLET | Freq: Every day | ORAL | 3 refills | Status: DC

## 2022-05-19 NOTE — Progress Notes (Signed)
Cardiology Office Note:    Date:  05/19/2022   ID:  Arabell, Hovorka 11/18/1980, MRN [id number]  PCP:  Shary Key, DO  Cardiologist:  Jenean Lindau, MD   Referring MD: Shary Key, DO    ASSESSMENT:    1. Essential hypertension    PLAN:    In order of problems listed above:  Primary prevention stressed with the patient.  Importance of compliance with diet, exercise stressed and she vocalized understanding and questions were answered to her satisfaction. Essential hypertension: Blood pressure stable and diet was emphasized.  Lifestyle modification urged.  She gets her blood work done from primary care providers and she will send me a copy.  She was advised to walk at least half an hour a day 5 days a week and she promises to do so.  Diet and salt intake issues were discussed. Patient will be seen in follow-up appointment in 6 months or earlier if the patient has any concerns.   Medication Adjustments/Labs and Tests Ordered: Current medicines are reviewed at length with the patient today.  Concerns regarding medicines are outlined above.  No orders of the defined types were placed in this encounter.  No orders of the defined types were placed in this encounter.    No chief complaint on file.    History of Present Illness:    Paige Neal is a 42 y.o. female.  She has past medical history of essential hypertension.  She denies any problems at this time and takes care of activities of daily living.  No chest pain orthopnea or PND.  At the time of my evaluation, the patient is alert awake oriented and in no distress.  She exercises on a regular basis.  Past Medical History:  Diagnosis Date   Annual physical exam 07/20/2020   Bilateral hand pain 07/05/2021   Breast mass 06/27/2011   EXAM: 08/2013 DIGITAL DIAGNOSTIC BILATERAL MAMMOGRAM WITH 3D TOMOSYNTHESIS WITH CAD ULTRASOUND LEFT BREAST COMPARISON: 07/05/2011. 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 RECOMMENDATION: Left breast ultrasound in 6 months. The options of ultrasound-guided core needle biopsy and surgical excision were also discussed with the patient but not recommended at this time. 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   Essential hypertension 12/01/2017   Family history of diabetes mellitus 11/14/2019   Mass of lower outer quadrant of left breast 04/19/2021   Neck mass 04/23/2021   Panic attack    Positive ANA (antinuclear antibody) 11/26/2020   Pruritus 01/02/2020   Routine screening for STI (sexually transmitted infection) 12/03/2021   Seasonal and perennial allergic rhinoconjunctivitis 02/15/2021   Stress 01/02/2020   Vaginal pruritis 10/29/2013   Vitamin D deficiency 11/26/2020    Past Surgical History:  Procedure Laterality Date   BREAST EXCISIONAL BIOPSY Bilateral    BREAST FIBROADENOMA SURGERY     gardisil x 3 - 08/30/2005      Current Medications: Current Meds  Medication Sig   amLODipine (NORVASC) 5 MG tablet Take 1 tablet (5 mg total) by mouth daily. 1 tablet (5mg ) by mouth daily. 2nd attempt, please arrange appt for additional refills.   cetirizine (ZYRTEC) 10 MG tablet TAKE 1 TABLET BY MOUTH EVERY DAY   Multiple Vitamin (MULTIVITAMIN) capsule Take 1 capsule by mouth daily.     Allergies:   Patient has no known allergies.   Social History   Socioeconomic History   Marital status: Married    Spouse name: Not on file  Number of children: Not on file   Years of education: Not on file   Highest education level: Not on file  Occupational History   Not on file  Tobacco Use   Smoking status: Never    Passive exposure: Never   Smokeless tobacco: Never  Vaping Use   Vaping Use: Never used  Substance and Sexual Activity   Alcohol use: No   Drug use: No   Sexual activity: Yes    Birth control/protection: None  Other Topics Concern   Not on file  Social History Narrative   Pt lives in [place] and works  at [employer],  single   Social Determinants of Radio broadcast assistant Strain: Not on file  Food Insecurity: No [place] (03/22/2018)   Hunger Vital Sign    Worried About Running Out of Food in the Last Year: Never true    [place] in the Last Year: Never true  Transportation Needs: No Transportation Needs (03/22/2018)   PRAPARE - Hydrologist (Medical): No    Lack of Transportation (Non-Medical): No  Physical Activity: Not on file  Stress: Not on file  Social Connections: Not on file     Family History: The patient's family history includes Coronary artery disease in her mother; Hypertension in her father, mother, and sister; Multiple sclerosis in her sister.  ROS:   Please see the history of present illness.    All other systems reviewed and are negative.  EKGs/Labs/Other Studies Reviewed:    The following studies were reviewed today: I discussed my findings with the patient.  EKG reveals bilateral enlargement and poor anterior forces.   Recent Labs: No results found for requested labs within last 365 days.  Recent Lipid Panel    Component Value Date/Time   CHOL 130 09/05/2017 0000   TRIG 58 09/05/2017 0000   HDL 54 09/05/2017 0000   CHOLHDL 2.4 09/05/2017 0000   CHOLHDL 2.9 Ratio 05/16/2007 1804   VLDL 18 05/16/2007 1804   LDLCALC 64 09/05/2017 0000    Physical Exam:    VS:  BP 118/73   Pulse 84   Ht 5\' 6"  (1.676 m)   Wt 140 lb 0.6 oz (63.5 kg)   SpO2 98%   BMI 22.60 kg/m     Wt Readings from Last 3 Encounters:  05/19/22 140 lb 0.6 oz (63.5 kg)  04/12/22 138 lb (62.6 kg)  12/03/21 139 lb (63 kg)     GEN: Patient is in no acute distress HEENT: Normal NECK: No JVD; No carotid bruits LYMPHATICS: No lymphadenopathy CARDIAC: Hear sounds regular, 2/6 systolic murmur at the apex. RESPIRATORY:  Clear to auscultation without rales, wheezing or rhonchi  ABDOMEN: Soft, non-tender, non-distended MUSCULOSKELETAL:  No  edema; No deformity  SKIN: Warm and dry NEUROLOGIC:  Alert and oriented x 3 PSYCHIATRIC:  Normal affect   Signed, Jenean Lindau, MD  05/19/2022 8:40 AM    [facility]

## 2022-05-19 NOTE — Patient Instructions (Signed)
Medication Instructions:  Your physician recommends that you continue on your current medications as directed. Please refer to the Current Medication list given to you today.  *If you need a refill on your cardiac medications before your next appointment, please call your pharmacy*   Lab Work: None ordered If you have labs (blood work) drawn today and your tests are completely normal, you will receive your results only by: MyChart Message (if you have MyChart) OR A paper copy in the mail If you have any lab test that is abnormal or we need to change your treatment, we will call you to review the results.   Testing/Procedures: None ordered   Follow-Up: At [facility], you and your health needs are our priority.  As part of our continuing mission to provide you with exceptional heart care, we have created designated Provider Care Teams.  These Care Teams include your primary Cardiologist (physician) and Advanced Practice Providers (APPs -  Physician Assistants and Nurse Practitioners) who all work together to provide you with the care you need, when you need it.  We recommend signing up for the patient portal called "MyChart".  Sign up information is provided on this After Visit Summary.  MyChart is used to connect with patients for Virtual Visits (Telemedicine).  Patients are able to view lab/test results, encounter notes, upcoming appointments, etc.  Non-urgent messages can be sent to your provider as well.   To learn more about what you can do with MyChart, go to [email or web address].    Your next appointment:   12 month(s)  The format for your next appointment:   In Person  Provider:   Rajan Revankar, MD    Other Instructions none  Important Information About Sugar      

## 2022-07-08 ENCOUNTER — Ambulatory Visit
Admission: RE | Admit: 2022-07-08 | Discharge: 2022-07-08 | Disposition: A | Discharge status: Home / Self Care | Payer: BC Managed Care – PPO | Source: Ambulatory Visit | Attending: Family Medicine | Admitting: Family Medicine

## 2022-07-08 ENCOUNTER — Ambulatory Visit
Admission: RE | Admit: 2022-07-08 | Discharge: 2022-07-08 | Disposition: A | Discharge status: Home / Self Care | Payer: No Typology Code available for payment source | Source: Ambulatory Visit | Attending: Family Medicine | Admitting: Family Medicine

## 2022-07-08 DIAGNOSIS — N6323 Unspecified lump in the left breast, lower outer quadrant: Secondary | ICD-10-CM

## 2022-07-27 ENCOUNTER — Ambulatory Visit (INDEPENDENT_AMBULATORY_CARE_PROVIDER_SITE_OTHER): Payer: No Typology Code available for payment source | Admitting: Family Medicine

## 2022-07-27 VITALS — BP 142/93 | HR 103 | Ht 66.0 in | Wt 140.0 lb

## 2022-07-27 DIAGNOSIS — R051 Acute cough: Secondary | ICD-10-CM

## 2022-07-27 MED ORDER — BENZONATATE 100 MG PO CAPS: 100.0000 mg | ORAL_CAPSULE | Freq: Two times a day (BID) | ORAL | 0 refills | Status: DC | PRN

## 2022-07-27 NOTE — Progress Notes (Signed)
    SUBJECTIVE:   CHIEF COMPLAINT / HPI:   COUGH  Has been coughing for about 10 days  Cough is: dry hacking Sputum production: no Medications tried: otc cough medications and drops Taking blood pressure medications: not new Patient believes might be causing their pain: her son was ill as were people at work   Symptoms Runny nose: mild Mucous in back of throat: yes Throat burning or reflux: no Wheezing or asthma: no Fever: perhaps a day early in the illness Chest Pain: no Shortness of breath: no Leg swelling: no Hemoptysis: no Weight loss: no   PERTINENT  PMH / PSH: ANA, Panic attacks, hypertension    OBJECTIVE:   BP (!) 142/93 (BP Location: Left Arm, Patient Position: Sitting, Cuff Size: Normal)   Pulse (!) 103   Ht 5\' 6"  (1.676 m)   Wt 140 lb (63.5 kg)   SpO2 99%   BMI 22.60 kg/m   No distress  Frequent dry cough Neck:  No deformities, thyromegaly, masses, or tenderness noted.   Supple with full range of motion without pain. Ears:  External ear exam shows no significant lesions or deformities.  Otoscopic examination reveals clear canals, tympanic membranes are intact bilaterally without bulging, retraction, inflammation or discharge. Hearing is grossly normal bilaterall Lungs:  Normal respiratory effort, chest expands symmetrically. Lungs are clear to auscultation, no crackles or wheezes. Heart - Regular rate and rhythm.  No murmurs, gallops or rubs.    Skin:  Intact without suspicious lesions or rashes   ASSESSMENT/PLAN:   Acute cough  Other orders -     Benzonatate; Take 1 capsule (100 mg total) by mouth 2 (two) times daily as needed for cough.  Dispense: 20 capsule; Refill: 0    Cough Consistent with a post viral cough No signs of pneumonia or pneumothorax or sinusitis or pertusis Treat with tessalon perles and monitor   Patient Instructions  Good to see you today - Thank you for coming in  Things we discussed today:  Cough - Nasal Saline use  at least four times a day  - Tessalon Perles twice a day as needed - Should slowly improve over the next 2 weeks - If fever or shortness of breath or chest pain then let us know  Make an appointment for a physical with Dr Idalia Needle   Carney Living, MD [facility]

## 2022-07-27 NOTE — Patient Instructions (Addendum)
Good to see you today - Thank you for coming in  Things we discussed today:  Cough - Nasal Saline use at least four times a day  - Tessalon Perles twice a day as needed - Should slowly improve over the next 2 weeks - If fever or shortness of breath or chest pain then let us know  Make an appointment for a physical with Dr Idalia Needle

## 2022-08-08 ENCOUNTER — Other Ambulatory Visit (HOSPITAL_COMMUNITY)
Admission: RE | Admit: 2022-08-08 | Discharge: 2022-08-08 | Disposition: A | Discharge status: Home / Self Care | Payer: No Typology Code available for payment source | Source: Ambulatory Visit | Attending: Family Medicine | Admitting: Family Medicine

## 2022-08-08 ENCOUNTER — Other Ambulatory Visit: Payer: Self-pay

## 2022-08-08 ENCOUNTER — Encounter: Payer: Self-pay | Admitting: Family Medicine

## 2022-08-08 ENCOUNTER — Ambulatory Visit (INDEPENDENT_AMBULATORY_CARE_PROVIDER_SITE_OTHER): Payer: No Typology Code available for payment source | Admitting: Family Medicine

## 2022-08-08 VITALS — BP 120/88 | HR 88 | Ht 66.0 in | Wt 140.6 lb

## 2022-08-08 DIAGNOSIS — Z0000 Encounter for general adult medical examination without abnormal findings: Secondary | ICD-10-CM | POA: Diagnosis not present

## 2022-08-08 DIAGNOSIS — Z124 Encounter for screening for malignant neoplasm of cervix: Secondary | ICD-10-CM

## 2022-08-08 DIAGNOSIS — L089 Local infection of the skin and subcutaneous tissue, unspecified: Secondary | ICD-10-CM

## 2022-08-08 DIAGNOSIS — J069 Acute upper respiratory infection, unspecified: Secondary | ICD-10-CM | POA: Diagnosis not present

## 2022-08-08 NOTE — Progress Notes (Signed)
    SUBJECTIVE:   Chief compliant/HPI: annual examination  Paige Neal is a 42 y.o. who presents today for an annual exam.   Has had cough, congestion. Denies fever. Son is sick as well. Drinking a lot of fluids. Using throat lozanges and Tessalon perles.   Has a bump under her left breast she noticed a few months ago. Hasn't changed in size. No drainage   LMP about a month ago, isnt sure exactly when   Takes amlodipine 5mg    History tabs reviewed and updated ***.   Review of systems form reviewed and notable for ***.   OBJECTIVE:   BP 120/88   Pulse 88   Ht 5\' 6"  (1.676 m)   Wt 140 lb 9.6 oz (63.8 kg)   SpO2 99%   BMI 22.69 kg/m    Physical exam General: well appearing, NAD Cardiovascular: RRR, no murmurs Lungs: CTAB. Normal WOB Abdomen: soft, non-distended, non-tender Skin: warm, dry. No edema  ASSESSMENT/PLAN:   No problem-specific Assessment & Plan notes found for this encounter.    Annual Examination  See AVS for age appropriate recommendations.   PHQ score ***, reviewed and discussed.  Blood pressure reviewed and at goal ***.  Asked about intimate partner violence and resources given as appropriate  The patient currently uses *** for contraception. Folate recommended as appropriate, minimum of 400 mcg per day.   Considered the following items based upon USPSTF recommendations: Diabetes screening: {discussed/ordered:14545} Screening for elevated cholesterol: {discussed/ordered:14545} HIV testing: {discussed/ordered:14545} Hepatitis C: {discussed/ordered:14545} Hepatitis B: {discussed/ordered:14545} Syphilis if at high risk: {discussed/ordered:14545} GC/CT {GC/CT screening :23818} Reviewed risk factors for latent tuberculosis and {not indicated/requested/declined:14582} Reviewed risk factors for osteoporosis. Using FRAX tool estimated risk of major osteoporotic fracture of  ***%, early screening {ordered not order:23822::"not  ordered"}   Discussed family history, BRCA testing {not indicated/requested/declined:14582}. Tool used to risk stratify was ***.  Cervical cancer screening: {PAPTYPE:23819} Breast cancer screening: {mammoscreen:23820} Colorectal cancer screening: {crcscreen:23821::"discussed, colonoscopy ordered"} if age 36 or over.   Follow up in 1 *** year or sooner if indicated.    Cora Collum, DO [facility]

## 2022-08-08 NOTE — Patient Instructions (Addendum)
It was great seeing you today!  Today we did your pap smear and I will call if anything is abnormal.  For your infection continue to stay well hydrated, you can use over the counter medicine like Mucinex DM for cough and congestion, honey. Saline spray can help clean out sinuses.   Feel free to call with any questions or concerns at any time, at [phone].   Take care,  Dr. Cora Collum [facility]

## 2022-08-09 DIAGNOSIS — L089 Local infection of the skin and subcutaneous tissue, unspecified: Secondary | ICD-10-CM | POA: Insufficient documentation

## 2022-08-09 DIAGNOSIS — J069 Acute upper respiratory infection, unspecified: Secondary | ICD-10-CM | POA: Insufficient documentation

## 2022-08-09 HISTORY — DX: Acute upper respiratory infection, unspecified: J06.9

## 2022-08-09 HISTORY — DX: Local infection of the skin and subcutaneous tissue, unspecified: L08.9

## 2022-08-09 NOTE — Assessment & Plan Note (Signed)
Vitals stable, afebrile. Discussed supportive care with OTC cough and congestion medication, honey, throat lozenges, staying well hydrated.

## 2022-08-09 NOTE — Assessment & Plan Note (Signed)
Under L axilla. Recommended warm compresses. Return precautions discussed. Can drain it if patient desires and it does not drain on its own.

## 2022-08-11 LAB — CYTOLOGY - PAP
Comment: NEGATIVE
Diagnosis: NEGATIVE
High risk HPV: NEGATIVE

## 2022-11-09 ENCOUNTER — Encounter: Payer: Self-pay | Admitting: Family Medicine

## 2022-12-09 ENCOUNTER — Ambulatory Visit: Payer: No Typology Code available for payment source

## 2022-12-09 DIAGNOSIS — Z23 Encounter for immunization: Secondary | ICD-10-CM

## 2022-12-09 NOTE — Progress Notes (Signed)
Patient presents to nurse clinic for flu vaccination. Administered in left deltoid, site unremarkable, tolerated injection well.   Veronda Prude, RN

## 2023-02-03 ENCOUNTER — Ambulatory Visit (HOSPITAL_COMMUNITY)
Admission: EM | Admit: 2023-02-03 | Discharge: 2023-02-03 | Disposition: A | Discharge status: Home / Self Care | Payer: No Typology Code available for payment source | Attending: Family Medicine | Admitting: Family Medicine

## 2023-02-03 ENCOUNTER — Encounter (HOSPITAL_COMMUNITY): Payer: Self-pay | Admitting: *Deleted

## 2023-02-03 DIAGNOSIS — N6342 Unspecified lump in left breast, subareolar: Secondary | ICD-10-CM | POA: Diagnosis not present

## 2023-02-03 NOTE — ED Provider Notes (Signed)
[facility]    CSN: [id number] Arrival date & time: 02/03/23  0807      History   Chief Complaint Chief Complaint  Patient presents with   Breast Mass    HPI Paige Neal is a 42 y.o. female.   Patient is here for a lump in her left breast.  She noted it last night.  It is tender.   She has not had this happen before.  It has gotten smaller since last night, not painful anymore.  While in the shower she did squeeze it and a small amount of white material did come out.  It is around her nipple/areola area.  She is on her period currently.  Mammogram/us done in 06/2022,  complicated cyst at the 4 oclock  position.  This lump is at a different location.        Past Medical History:  Diagnosis Date   Annual physical exam 07/20/2020   Bilateral hand pain 07/05/2021   Breast mass 06/27/2011   EXAM: 08/2013 DIGITAL DIAGNOSTIC BILATERAL MAMMOGRAM WITH 3D TOMOSYNTHESIS WITH CAD ULTRASOUND LEFT BREAST COMPARISON: 07/05/2011. 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 RECOMMENDATION: Left breast ultrasound in 6 months. The options of ultrasound-guided core needle biopsy and surgical excision were also discussed with the patient but not recommended at this time. 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   Essential hypertension 12/01/2017   Family history of diabetes mellitus 11/14/2019   Mass of lower outer quadrant of left breast 04/19/2021   Neck mass 04/23/2021   Panic attack    Positive ANA (antinuclear antibody) 11/26/2020   Pruritus 01/02/2020   Routine screening for STI (sexually transmitted infection) 12/03/2021   Seasonal and perennial allergic rhinoconjunctivitis 02/15/2021   Stress 01/02/2020   Vaginal pruritis 10/29/2013   Vitamin D deficiency 11/26/2020    Patient Active Problem List   Diagnosis Date Noted   URI with cough and congestion 08/09/2022   Skin pustule 08/09/2022   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   Routine  screening for STI (sexually transmitted infection) 12/03/2021   Bilateral hand pain 07/05/2021   Neck mass 04/23/2021   Mass of lower outer quadrant of left breast 04/19/2021   Seasonal and perennial allergic rhinoconjunctivitis 02/15/2021   Positive ANA (antinuclear antibody) 11/26/2020   Vitamin D deficiency 11/26/2020   Annual physical exam 07/20/2020   Panic attack    Pruritus 01/02/2020   Stress 01/02/2020   Family history of diabetes mellitus 11/14/2019   Essential hypertension 12/01/2017   Vaginal pruritis 10/29/2013   Breast mass 06/27/2011    Past Surgical History:  Procedure Laterality Date   BREAST EXCISIONAL BIOPSY Bilateral    BREAST FIBROADENOMA SURGERY     gardisil x 3 - 08/30/2005      OB History     Gravida  2   Para  2   Term  1   Preterm  1   AB  0   Living  2      SAB      IAB      Ectopic      Multiple  0   Live Births  2            Home Medications    Prior to Admission medications   Medication Sig Start Date End Date Taking? Authorizing Provider  amLODipine (NORVASC) 5 MG tablet Take 1 tablet (5 mg total) by mouth daily. 05/19/22  Yes Revankar, Aundra Dubin, MD  cetirizine (  ZYRTEC) 10 MG tablet TAKE 1 TABLET BY MOUTH EVERY DAY 02/10/22  Yes Paige, Lucas Mallow, DO  Multiple Vitamin (MULTIVITAMIN) capsule Take 1 capsule by mouth daily.   Yes [provider]  benzonatate (TESSALON) 100 MG capsule Take 1 capsule (100 mg total) by mouth 2 (two) times daily as needed for cough. 07/27/22   Carney Living, MD  ferrous sulfate 325 (65 FE) MG tablet TAKE 1 TABLET BY MOUTH EVERY DAY WITH BREAKFAST Patient not taking: Reported on 05/19/2022 04/19/21   Cora Collum, DO  hydrOXYzine (ATARAX) 10 MG tablet Take 1 tablet (10 mg total) by mouth 3 (three) times daily as needed. Patient not taking: Reported on 05/19/2022 04/12/22   Littie Deeds, MD    Family History Family History  Problem Relation Age of Onset   Coronary artery  disease Mother    Hypertension Mother    Hypertension Father    Hypertension Sister    Multiple sclerosis Sister     Social History Social History   Tobacco Use   Smoking status: Never    Passive exposure: Never   Smokeless tobacco: Never  Vaping Use   Vaping status: Never Used  Substance Use Topics   Alcohol use: No   Drug use: No     Allergies   Patient has no known allergies.   Review of Systems Review of Systems  Constitutional: Negative.   HENT: Negative.    Respiratory: Negative.    Cardiovascular: Negative.   Gastrointestinal: Negative.   Genitourinary: Negative.   Musculoskeletal: Negative.   Psychiatric/Behavioral: Negative.       Physical Exam Triage Vital Signs ED Triage Vitals  Encounter Vitals Group     BP 02/03/23 0849 (!) 132/92     Systolic BP Percentile --      Diastolic BP Percentile --      Pulse Rate 02/03/23 0849 79     Resp 02/03/23 0849 18     Temp 02/03/23 0849 99 F (37.2 C)     Temp Source 02/03/23 0849 Oral     SpO2 02/03/23 0849 96 %     Weight --      Height --      Head Circumference --      Peak Flow --      Pain Score 02/03/23 0848 0     Pain Loc --      Pain Education --      Exclude from Growth Chart --    No data found.  Updated Vital Signs BP (!) 132/92 (BP Location: Right Arm)   Pulse 79   Temp 99 F (37.2 C) (Oral)   Resp 18   LMP 01/31/2023 (Exact Date)   SpO2 96%   Visual Acuity Right Eye Distance:   Left Eye Distance:   Bilateral Distance:    Right Eye Near:   Left Eye Near:    Bilateral Near:     Physical Exam Constitutional:      Appearance: Normal appearance.  Cardiovascular:     Rate and Rhythm: Normal rate and regular rhythm.  Pulmonary:     Effort: Pulmonary effort is normal.     Breath sounds: Normal breath sounds.  Chest:  Breasts:    Right: Normal.     Comments: Very small cyst at the 12 oclock position subareolar;  no tenderness noted Skin:    General: Skin is warm.   Neurological:     General: No focal deficit present.  Mental Status: She is alert.  Psychiatric:        Mood and Affect: Mood normal.      UC Treatments / Results  Labs (all labs ordered are listed, but only abnormal results are displayed) Labs Reviewed - No data to display  EKG   Radiology No results found.  Procedures Procedures (including critical care time)  Medications Ordered in UC Medications - No data to display  Initial Impression / Assessment and Plan / UC Course  I have reviewed the triage vital signs and the nursing notes.  Pertinent labs & imaging results that were available during my care of the patient were reviewed by me and considered in my medical decision making (see chart for details).    Final Clinical Impressions(s) / UC Diagnoses   Final diagnoses:  Subareolar mass of left breast     Discharge Instructions      You were seen today for a breast lump.   For reassurance I have ordered an ultrasound to take a look at the left breast.  They should contact you in several days, but if you do not hear anything either call us or [facility] breast center to make an appointment.     ED Prescriptions   None    PDMP not reviewed this encounter.   Jannifer Franklin, MD 02/03/23 [phone]

## 2023-02-03 NOTE — Discharge Instructions (Signed)
You were seen today for a breast lump.   For reassurance I have ordered an ultrasound to take a look at the left breast.  They should contact you in several days, but if you do not hear anything either call us or [facility] breast center to make an appointment.

## 2023-02-03 NOTE — ED Triage Notes (Signed)
Pt states she noticed a lump on her left breast last night. She states that it was very sensitive last night when she touched it.

## 2023-02-06 ENCOUNTER — Other Ambulatory Visit (HOSPITAL_COMMUNITY): Payer: Self-pay | Admitting: Family Medicine

## 2023-02-06 DIAGNOSIS — N6002 Solitary cyst of left breast: Secondary | ICD-10-CM

## 2023-02-07 ENCOUNTER — Encounter: Payer: Self-pay | Admitting: Family Medicine

## 2023-02-23 ENCOUNTER — Other Ambulatory Visit: Payer: No Typology Code available for payment source

## 2023-04-21 ENCOUNTER — Other Ambulatory Visit: Payer: Self-pay

## 2023-04-21 DIAGNOSIS — T7840XS Allergy, unspecified, sequela: Secondary | ICD-10-CM

## 2023-04-21 MED ORDER — CETIRIZINE HCL 10 MG PO TABS: 10.0000 mg | ORAL_TABLET | Freq: Every day | ORAL | 11 refills | Status: DC

## 2023-05-08 ENCOUNTER — Other Ambulatory Visit (HOSPITAL_COMMUNITY): Payer: Self-pay | Admitting: Family Medicine

## 2023-05-08 ENCOUNTER — Ambulatory Visit
Admission: RE | Admit: 2023-05-08 | Discharge: 2023-05-08 | Disposition: A | Discharge status: Home / Self Care | Payer: No Typology Code available for payment source | Source: Ambulatory Visit | Attending: Family Medicine

## 2023-05-08 DIAGNOSIS — N6320 Unspecified lump in the left breast, unspecified quadrant: Secondary | ICD-10-CM

## 2023-05-08 DIAGNOSIS — N6342 Unspecified lump in left breast, subareolar: Secondary | ICD-10-CM

## 2023-05-08 DIAGNOSIS — N6002 Solitary cyst of left breast: Secondary | ICD-10-CM

## 2023-05-09 ENCOUNTER — Other Ambulatory Visit: Payer: Self-pay | Admitting: Family Medicine

## 2023-05-09 DIAGNOSIS — N6320 Unspecified lump in the left breast, unspecified quadrant: Secondary | ICD-10-CM

## 2023-05-11 ENCOUNTER — Ambulatory Visit
Admission: RE | Admit: 2023-05-11 | Discharge: 2023-05-11 | Disposition: A | Discharge status: Home / Self Care | Source: Ambulatory Visit | Attending: Family Medicine | Admitting: Family Medicine

## 2023-05-11 DIAGNOSIS — N6320 Unspecified lump in the left breast, unspecified quadrant: Secondary | ICD-10-CM

## 2023-05-11 HISTORY — PX: BREAST BIOPSY: SHX20

## 2023-05-12 LAB — SURGICAL PATHOLOGY

## 2023-05-25 ENCOUNTER — Ambulatory Visit: Payer: No Typology Code available for payment source | Attending: Cardiology | Admitting: Cardiology

## 2023-05-25 ENCOUNTER — Encounter: Payer: Self-pay | Admitting: Cardiology

## 2023-05-25 VITALS — BP 128/82 | HR 78 | Ht 66.0 in | Wt 140.4 lb

## 2023-05-25 DIAGNOSIS — I10 Essential (primary) hypertension: Secondary | ICD-10-CM | POA: Diagnosis not present

## 2023-05-25 NOTE — Patient Instructions (Signed)

## 2023-05-25 NOTE — Progress Notes (Signed)
 Cardiology Office Note:    Date:  05/25/2023   ID:  Paige Neal, DOB [birth year], MRN [id number]  PCP:  Paige Lash, MD  Cardiologist:  Paige Brothers, MD   Referring MD: Paige Lash, MD    ASSESSMENT:    1. Essential hypertension    PLAN:    In order of problems listed above:  Primary prevention stressed with the patient.  Importance of compliance with diet medication stressed and patient verbalized standing. She was advised to walk at least half another day on a daily basis Essential hypertension: Blood pressure is stable and diet was emphasized.  Salt and diet issues were discussed at length Lab work was reviewed that she sent from her workplace in the past.  I discussed this with her also. Patient will be seen in follow-up appointment in 6 months or earlier if the patient has any concerns.    Medication Adjustments/Labs and Tests Ordered: Current medicines are reviewed at length with the patient today.  Concerns regarding medicines are outlined above.  Orders Placed This Encounter  Procedures   EKG 12-Lead   No orders of the defined types were placed in this encounter.    No chief complaint on file.    History of Present Illness:    Paige Neal is a 43 y.o. female.  Patient has past medical history of essential hypertension.  She takes the medicine as  Past Medical History:  Diagnosis Date   Annual physical exam 07/20/2020   Bilateral hand pain 07/05/2021   Breast mass 06/27/2011   EXAM: 08/2013 DIGITAL DIAGNOSTIC BILATERAL MAMMOGRAM WITH 3D TOMOSYNTHESIS WITH CAD ULTRASOUND LEFT BREAST COMPARISON: 07/05/2011. 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 RECOMMENDATION: Left breast ultrasound in 6 months. The options of ultrasound-guided core needle biopsy and surgical excision were also discussed with the patient but not recommended at this time. 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   Essential hypertension  12/01/2017   Family history of diabetes mellitus 11/14/2019   Mass of lower outer quadrant of left breast 04/19/2021   Neck mass 04/23/2021   Panic attack    Positive ANA (antinuclear antibody) 11/26/2020   Pruritus 01/02/2020   Routine screening for STI (sexually transmitted infection) 12/03/2021   Seasonal and perennial allergic rhinoconjunctivitis 02/15/2021   Skin pustule 08/09/2022   Stress 01/02/2020   URI with cough and congestion 08/09/2022   Vaginal pruritis 10/29/2013   Vitamin D deficiency 11/26/2020    Past Surgical History:  Procedure Laterality Date   BREAST BIOPSY Left 05/11/2023   [place] LT BREAST BX W LOC DEV 1ST LESION IMG BX SPEC US GUIDE 05/11/2023 [facility]   BREAST EXCISIONAL BIOPSY Bilateral    BREAST FIBROADENOMA SURGERY     gardisil x 3 - 08/30/2005      Current Medications: Current Meds  Medication Sig   amLODipine (NORVASC) 5 MG tablet Take 1 tablet (5 mg total) by mouth daily.   cetirizine (ZYRTEC) 10 MG tablet Take 1 tablet (10 mg total) by mouth daily.   Multiple Vitamin (MULTIVITAMIN) capsule Take 1 capsule by mouth daily.     Allergies:   Patient has no known allergies.   Social History   Socioeconomic History   Marital status: Married    Spouse name: Not on file   Number of children: Not on file   Years of education: Not on file   Highest education level: Not on file  Occupational History   Not on file  Tobacco Use   Smoking status: Never    Passive exposure: Never   Smokeless tobacco: Never  Vaping Use   Vaping status: Never Used  Substance and Sexual Activity   Alcohol use: No   Drug use: No   Sexual activity: Yes    Birth control/protection: None  Other Topics Concern   Not on file  Social History Narrative   Pt lives in [place] and works at [employer],  single   Social Drivers of Corporate investment banker Strain: Not on file  Food Insecurity: No Food Insecurity (03/22/2018)   Hunger Vital Sign    Worried About  Running Out of Food in the Last Year: Never true    Ran Out of Food in the Last Year: Never true  Transportation Needs: No Transportation Needs (03/22/2018)   PRAPARE - Administrator, Civil Service (Medical): No    Lack of Transportation (Non-Medical): No  Physical Activity: Not on file  Stress: Not on file  Social Connections: Not on file     Family History: The patient's family history includes Coronary artery disease in her mother; Hypertension in her father, mother, and sister; Multiple sclerosis in her sister.  ROS:   Please see the history of present illness.    All other systems reviewed and are negative.  EKGs/Labs/Other Studies Reviewed:    The following studies were reviewed today: .Marland KitchenEKG Interpretation Date/Time:  Thursday May 25 2023 16:31:33 EDT Ventricular Rate:  78 PR Interval:  144 QRS Duration:  74 QT Interval:  402 QTC Calculation: 458 R Axis:   56  Text Interpretation: Normal sinus rhythm Normal ECG When compared with ECG of 18-Aug-2017 11:28, No significant change was found Confirmed by Belva Crome [id number]) on 05/25/2023 4:39:36 PM     Recent Labs: No results found for requested labs within last 365 days.  Recent Lipid Panel    Component Value Date/Time   CHOL 130 09/05/2017 0000   TRIG 58 09/05/2017 0000   HDL 54 09/05/2017 0000   CHOLHDL 2.4 09/05/2017 0000   CHOLHDL 2.9 Ratio 05/16/2007 1804   VLDL 18 05/16/2007 1804   LDLCALC 64 09/05/2017 0000    Physical Exam:    VS:  BP 128/82   Pulse 78   Ht 5\' 6"  (1.676 m)   Wt 140 lb 6.4 oz (63.7 kg)   SpO2 97%   BMI 22.66 kg/m     Wt Readings from Last 3 Encounters:  05/25/23 140 lb 6.4 oz (63.7 kg)  08/08/22 140 lb 9.6 oz (63.8 kg)  07/27/22 140 lb (63.5 kg)     GEN: Patient is in no acute distress HEENT: Normal NECK: No JVD; No carotid bruits LYMPHATICS: No lymphadenopathy CARDIAC: Hear sounds regular, 2/6 systolic murmur at the apex. RESPIRATORY:  Clear to  auscultation without rales, wheezing or rhonchi  ABDOMEN: Soft, non-tender, non-distended MUSCULOSKELETAL:  No edema; No deformity  SKIN: Warm and dry NEUROLOGIC:  Alert and oriented x 3 PSYCHIATRIC:  Normal affect   Signed, Paige Brothers, MD  05/25/2023 4:49 PM    [facility]

## 2023-05-28 ENCOUNTER — Other Ambulatory Visit: Payer: Self-pay | Admitting: Cardiology

## 2023-05-29 NOTE — Telephone Encounter (Signed)
 Prescription sent to pharmacy.

## 2023-06-29 ENCOUNTER — Other Ambulatory Visit: Payer: Self-pay | Admitting: Family Medicine

## 2023-06-29 DIAGNOSIS — Z1231 Encounter for screening mammogram for malignant neoplasm of breast: Secondary | ICD-10-CM

## 2023-07-10 ENCOUNTER — Other Ambulatory Visit: Payer: No Typology Code available for payment source

## 2023-07-11 ENCOUNTER — Ambulatory Visit
Admission: RE | Admit: 2023-07-11 | Discharge: 2023-07-11 | Disposition: A | Discharge status: Home / Self Care | Source: Ambulatory Visit | Attending: Family Medicine | Admitting: Family Medicine

## 2023-07-11 DIAGNOSIS — Z1231 Encounter for screening mammogram for malignant neoplasm of breast: Secondary | ICD-10-CM

## 2023-07-14 ENCOUNTER — Ambulatory Visit: Payer: Self-pay | Admitting: Family Medicine

## 2024-01-09 ENCOUNTER — Ambulatory Visit (INDEPENDENT_AMBULATORY_CARE_PROVIDER_SITE_OTHER)

## 2024-01-09 DIAGNOSIS — Z23 Encounter for immunization: Secondary | ICD-10-CM | POA: Diagnosis not present

## 2024-01-09 NOTE — Progress Notes (Signed)
 Patient presents to nurse clinic for flu vaccination. Administered in LD, site unremarkable, tolerated injection well.   Veronda Prude, RN

## 2024-02-08 IMAGING — MG DIGITAL DIAGNOSTIC BILAT W/ TOMO W/ CAD
8 series · 8 of 24 positions shown · non-contrast
Comparison: Previous exam(s).

CLINICAL DATA: 41-year-old female with palpable lump in the OUTER
LEFT breast discovered on self-examination and clinical examination.
Also for annual bilateral mammogram. History of fibroadenomas and
breast surgeries.

EXAM:
DIGITAL DIAGNOSTIC BILATERAL MAMMOGRAM WITH TOMOSYNTHESIS AND CAD;
ULTRASOUND LEFT BREAST LIMITED
TECHNIQUE: Bilateral digital diagnostic mammography and breast tomosynthesis
was performed. The images were evaluated with computer-aided
detection.; Targeted ultrasound examination of the left breast was
performed.

[L CC synth-2D]
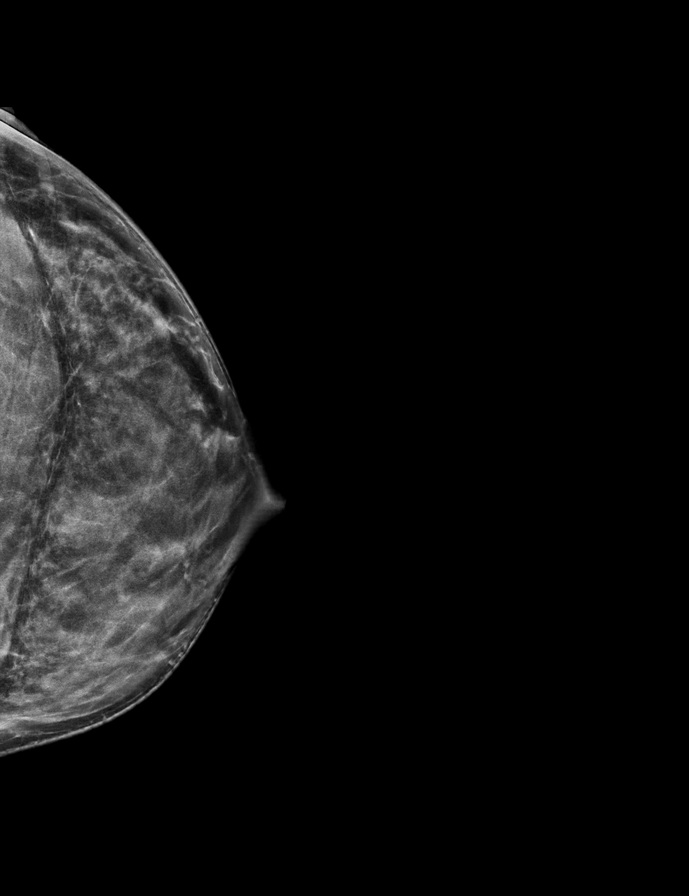

[R MLO synth-2D]
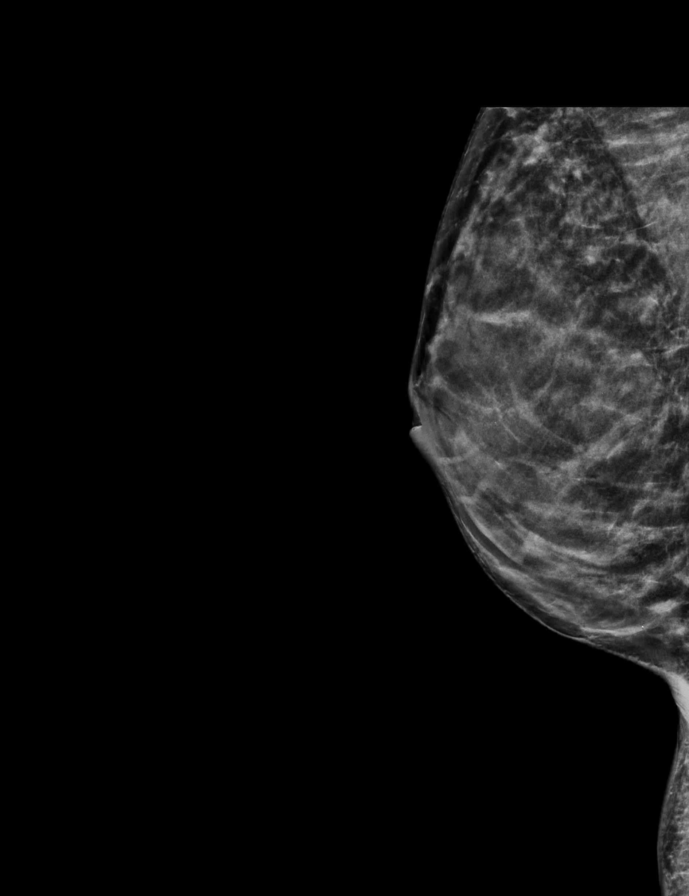

[L MLO synth-2D]
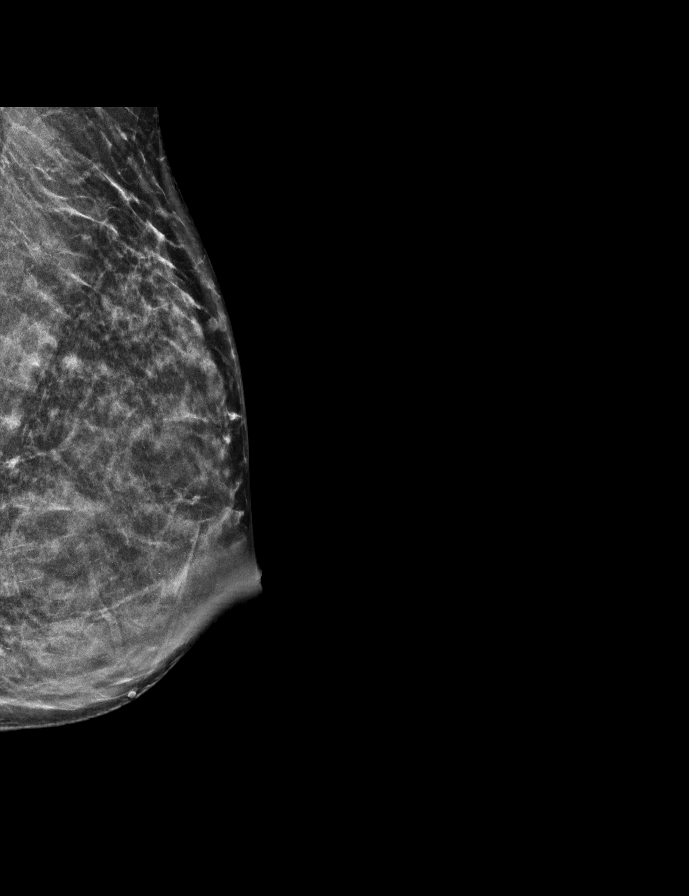

[R CC synth-2D]
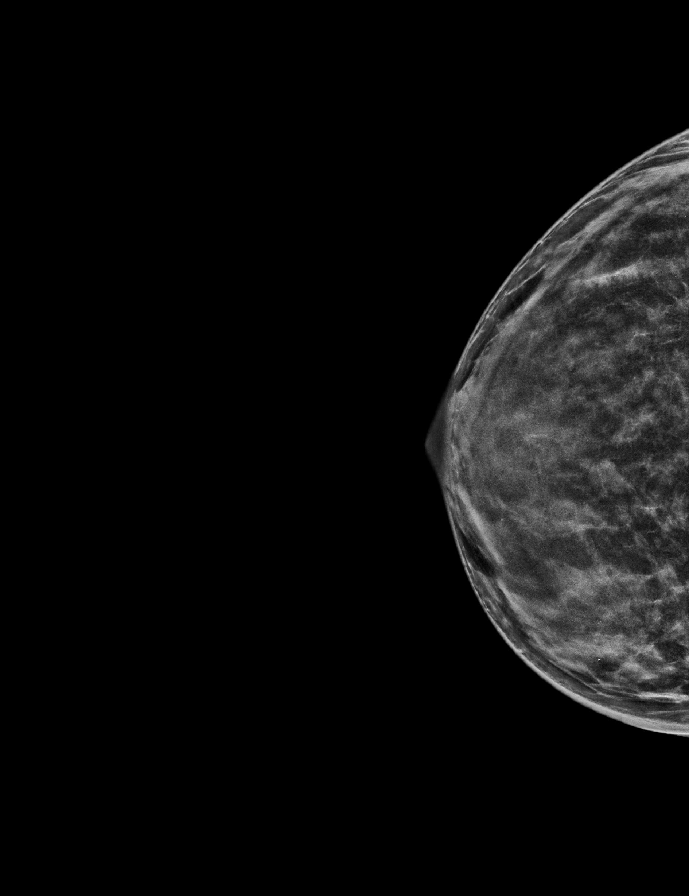

[L MLO tomo · tomo slice 29/58.0]
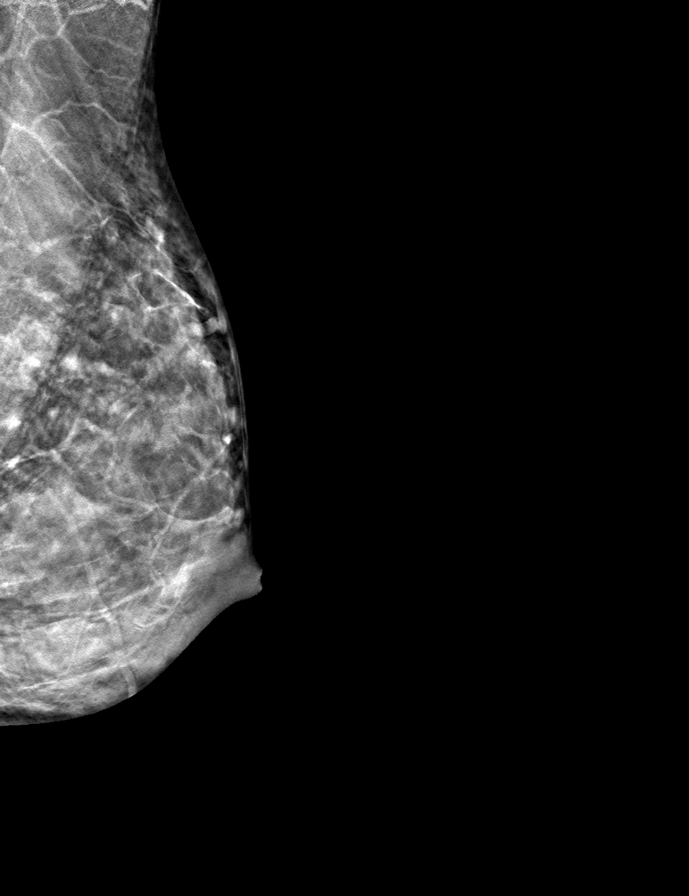

[R CC tomo · tomo slice 28/55.0]
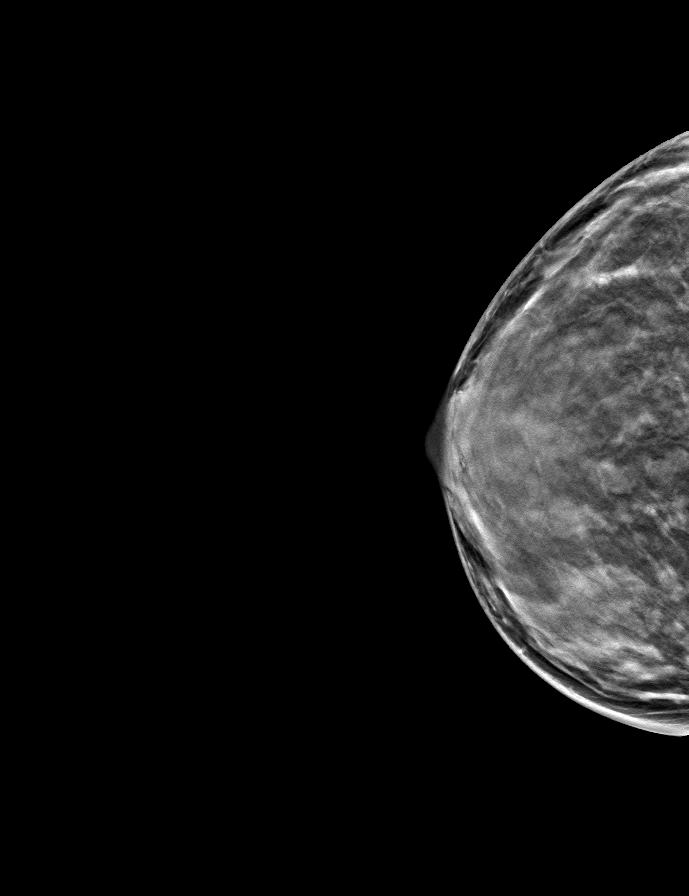

[L CC tomo · tomo slice 31/61.0]
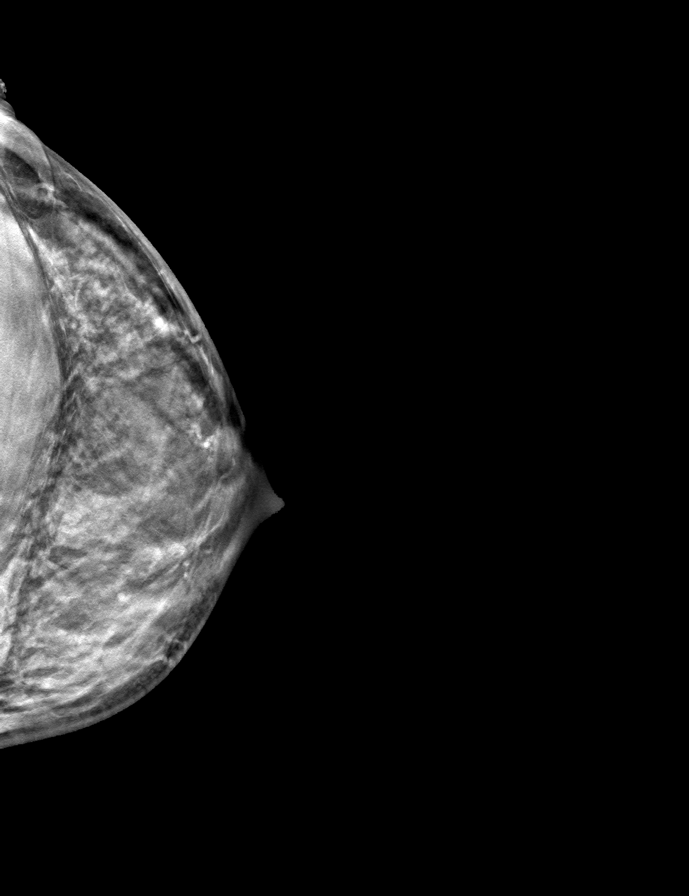

[R MLO tomo · tomo slice 31/60.0]
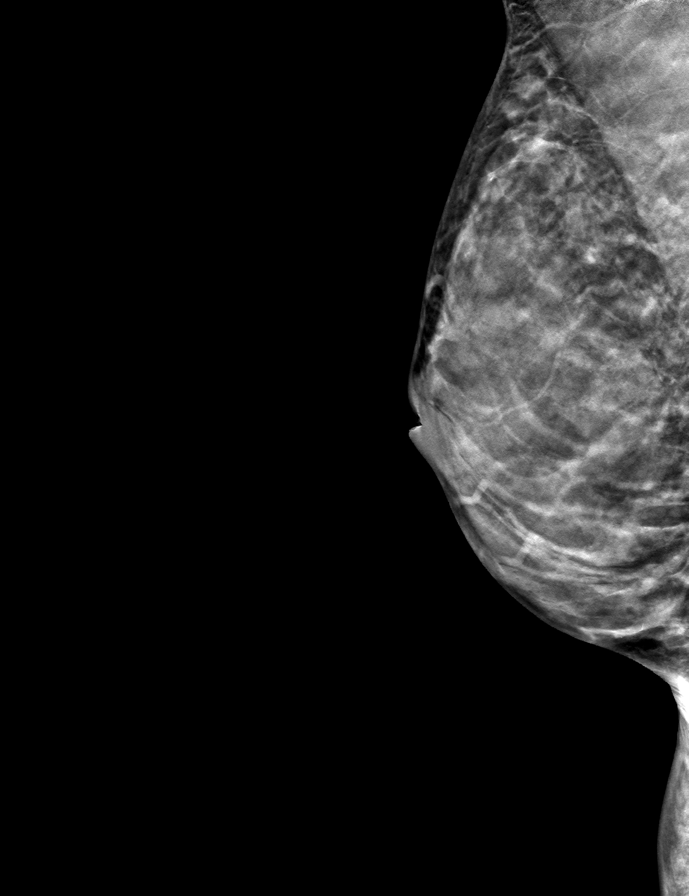

[8 of 24 positions shown; findings below may reference images not displayed]

ACR Breast Density Category d: The breast tissue is extremely dense,
which lowers the sensitivity of mammography.
FINDINGS: 2D/3D full field views of both breasts demonstrate no suspicious
mass, nonsurgical distortion or worrisome calcifications.

Targeted ultrasound is performed, showing a 0.4 x 0.2 x 0.4 cm
circumscribed oval hypoechoic mass at the 4 o'clock position of the
LEFT breast 4 cm from the nipple.

Scattered incidental benign cysts within the LEFT breast are noted
with a 0.6 cm cyst at the 12 o'clock position 5 cm from the nipple
and 2 o'clock position 5 cm from the nipple.

No other sonographic abnormalities are noted within the OUTER LEFT
breast.
IMPRESSION: 1. 0.4 cm likely benign mass in the LOWER OUTER LEFT breast. Options
of tissue sampling and short-term follow-up were presented to the
patient. We both agreed to proceed with short-term follow-up in 6
months.
2. Benign cysts within the UPPER and UPPER OUTER LEFT breast.
3. No mammographic evidence of breast malignancy.

RECOMMENDATION:
LEFT breast ultrasound in 6 months.

I have discussed the findings and recommendations with the patient.
If applicable, a reminder letter will be sent to the patient
regarding the next appointment.

BI-RADS CATEGORY  3: Probably benign.

## 2024-02-27 ENCOUNTER — Other Ambulatory Visit: Payer: Self-pay | Admitting: Family Medicine

## 2024-02-27 DIAGNOSIS — T7840XS Allergy, unspecified, sequela: Secondary | ICD-10-CM
